# Patient Record
Sex: Male | Born: 1940 | Race: White | Hispanic: No | Marital: Married | Smoking: Never smoker
Health system: Southern US, Community
[De-identification: ages and names within clinical notes are randomized; demographics above are authoritative.]

## PROBLEM LIST (undated history)

## (undated) DIAGNOSIS — N400 Enlarged prostate without lower urinary tract symptoms: Secondary | ICD-10-CM

## (undated) DIAGNOSIS — M1990 Unspecified osteoarthritis, unspecified site: Secondary | ICD-10-CM

## (undated) DIAGNOSIS — I10 Essential (primary) hypertension: Secondary | ICD-10-CM

## (undated) DIAGNOSIS — D649 Anemia, unspecified: Secondary | ICD-10-CM

## (undated) DIAGNOSIS — K219 Gastro-esophageal reflux disease without esophagitis: Secondary | ICD-10-CM

## (undated) DIAGNOSIS — Z8719 Personal history of other diseases of the digestive system: Secondary | ICD-10-CM

## (undated) DIAGNOSIS — R42 Dizziness and giddiness: Secondary | ICD-10-CM

## (undated) DIAGNOSIS — L570 Actinic keratosis: Secondary | ICD-10-CM

## (undated) HISTORY — PX: LASER OF PROSTATE W/ GREEN LIGHT PVP: SHX1953

## (undated) HISTORY — PX: HERNIA REPAIR: SHX51

## (undated) HISTORY — PX: SEPTOPLASTY: SUR1290

## (undated) HISTORY — DX: Actinic keratosis: L57.0

## (undated) HISTORY — PX: HAMMER TOE SURGERY: SHX385

## (undated) HISTORY — PX: SHOULDER ARTHROSCOPY WITH CAPSULORRHAPHY: SHX6454

---

## 2005-01-31 ENCOUNTER — Inpatient Hospital Stay: Payer: Self-pay | Admitting: Internal Medicine

## 2005-02-10 ENCOUNTER — Ambulatory Visit: Payer: Self-pay | Admitting: Specialist

## 2006-07-15 ENCOUNTER — Ambulatory Visit: Payer: Self-pay | Admitting: Unknown Physician Specialty

## 2007-05-11 ENCOUNTER — Ambulatory Visit: Payer: Self-pay | Admitting: Ophthalmology

## 2007-05-12 ENCOUNTER — Ambulatory Visit: Payer: Self-pay | Admitting: Ophthalmology

## 2007-05-17 ENCOUNTER — Ambulatory Visit: Payer: Self-pay | Admitting: Ophthalmology

## 2007-09-21 DIAGNOSIS — C4491 Basal cell carcinoma of skin, unspecified: Secondary | ICD-10-CM

## 2007-09-21 HISTORY — DX: Basal cell carcinoma of skin, unspecified: C44.91

## 2008-01-15 ENCOUNTER — Emergency Department: Payer: Self-pay | Admitting: Urology

## 2008-03-08 ENCOUNTER — Ambulatory Visit: Payer: Self-pay | Admitting: Internal Medicine

## 2011-03-05 ENCOUNTER — Ambulatory Visit: Payer: Self-pay | Admitting: Physician Assistant

## 2011-03-23 ENCOUNTER — Ambulatory Visit: Payer: Self-pay | Admitting: Unknown Physician Specialty

## 2011-05-11 ENCOUNTER — Ambulatory Visit: Payer: Self-pay | Admitting: Unknown Physician Specialty

## 2011-05-13 LAB — PATHOLOGY REPORT

## 2011-09-17 ENCOUNTER — Ambulatory Visit: Payer: Self-pay | Admitting: Otolaryngology

## 2012-09-18 ENCOUNTER — Ambulatory Visit: Payer: Self-pay | Admitting: Urology

## 2013-03-05 ENCOUNTER — Ambulatory Visit: Payer: Self-pay | Admitting: Podiatry

## 2013-03-05 DIAGNOSIS — E789 Disorder of lipoprotein metabolism, unspecified: Secondary | ICD-10-CM

## 2013-03-05 LAB — CBC WITH DIFFERENTIAL/PLATELET
Basophil %: 0.5 %
Eosinophil #: 0.1 10*3/uL (ref 0.0–0.7)
Eosinophil %: 1.6 %
HCT: 41.9 % (ref 40.0–52.0)
HGB: 14.5 g/dL (ref 13.0–18.0)
Lymphocyte %: 28.9 %
MCV: 92 fL (ref 80–100)
Monocyte %: 9.9 %
WBC: 7.2 10*3/uL (ref 3.8–10.6)

## 2013-03-09 ENCOUNTER — Ambulatory Visit: Payer: Self-pay | Admitting: Podiatry

## 2013-05-11 ENCOUNTER — Ambulatory Visit: Payer: Self-pay | Admitting: Otolaryngology

## 2013-05-11 LAB — CREATININE, SERUM
Creatinine: 0.93 mg/dL (ref 0.60–1.30)
EGFR (African American): >60
EGFR (Non-African Amer.): >60

## 2013-11-24 ENCOUNTER — Ambulatory Visit: Payer: Self-pay | Admitting: Specialist

## 2014-11-08 DIAGNOSIS — M1711 Unilateral primary osteoarthritis, right knee: Secondary | ICD-10-CM | POA: Diagnosis not present

## 2014-11-15 DIAGNOSIS — N403 Nodular prostate with lower urinary tract symptoms: Secondary | ICD-10-CM | POA: Diagnosis not present

## 2014-11-15 DIAGNOSIS — Z79899 Other long term (current) drug therapy: Secondary | ICD-10-CM | POA: Diagnosis not present

## 2014-11-15 DIAGNOSIS — E780 Pure hypercholesterolemia: Secondary | ICD-10-CM | POA: Diagnosis not present

## 2014-11-15 DIAGNOSIS — R7309 Other abnormal glucose: Secondary | ICD-10-CM | POA: Diagnosis not present

## 2014-11-15 DIAGNOSIS — I10 Essential (primary) hypertension: Secondary | ICD-10-CM | POA: Diagnosis not present

## 2014-11-15 DIAGNOSIS — D649 Anemia, unspecified: Secondary | ICD-10-CM | POA: Diagnosis not present

## 2014-11-19 ENCOUNTER — Ambulatory Visit: Payer: Self-pay | Admitting: Podiatry

## 2014-11-19 DIAGNOSIS — B351 Tinea unguium: Secondary | ICD-10-CM | POA: Diagnosis not present

## 2014-11-19 DIAGNOSIS — M898X9 Other specified disorders of bone, unspecified site: Secondary | ICD-10-CM | POA: Diagnosis not present

## 2014-11-19 DIAGNOSIS — M79675 Pain in left toe(s): Secondary | ICD-10-CM | POA: Diagnosis not present

## 2014-11-19 DIAGNOSIS — M2042 Other hammer toe(s) (acquired), left foot: Secondary | ICD-10-CM | POA: Diagnosis not present

## 2014-11-19 DIAGNOSIS — M79674 Pain in right toe(s): Secondary | ICD-10-CM | POA: Diagnosis not present

## 2014-11-22 ENCOUNTER — Ambulatory Visit: Payer: Self-pay | Admitting: Podiatry

## 2014-11-22 DIAGNOSIS — M898X9 Other specified disorders of bone, unspecified site: Secondary | ICD-10-CM | POA: Diagnosis not present

## 2014-11-22 DIAGNOSIS — Z791 Long term (current) use of non-steroidal anti-inflammatories (NSAID): Secondary | ICD-10-CM | POA: Diagnosis not present

## 2014-11-22 DIAGNOSIS — N419 Inflammatory disease of prostate, unspecified: Secondary | ICD-10-CM | POA: Diagnosis not present

## 2014-11-22 DIAGNOSIS — Z79899 Other long term (current) drug therapy: Secondary | ICD-10-CM | POA: Diagnosis not present

## 2014-11-22 DIAGNOSIS — E785 Hyperlipidemia, unspecified: Secondary | ICD-10-CM | POA: Diagnosis not present

## 2014-11-22 DIAGNOSIS — Z7982 Long term (current) use of aspirin: Secondary | ICD-10-CM | POA: Diagnosis not present

## 2014-11-22 DIAGNOSIS — K219 Gastro-esophageal reflux disease without esophagitis: Secondary | ICD-10-CM | POA: Diagnosis not present

## 2014-11-22 DIAGNOSIS — M1990 Unspecified osteoarthritis, unspecified site: Secondary | ICD-10-CM | POA: Diagnosis not present

## 2014-11-22 DIAGNOSIS — M2042 Other hammer toe(s) (acquired), left foot: Secondary | ICD-10-CM | POA: Diagnosis not present

## 2014-11-22 DIAGNOSIS — B20 Human immunodeficiency virus [HIV] disease: Secondary | ICD-10-CM | POA: Diagnosis not present

## 2014-11-22 DIAGNOSIS — N400 Enlarged prostate without lower urinary tract symptoms: Secondary | ICD-10-CM | POA: Diagnosis not present

## 2014-11-22 DIAGNOSIS — M899 Disorder of bone, unspecified: Secondary | ICD-10-CM | POA: Diagnosis not present

## 2014-11-27 DIAGNOSIS — M2042 Other hammer toe(s) (acquired), left foot: Secondary | ICD-10-CM | POA: Diagnosis not present

## 2014-12-20 DIAGNOSIS — E785 Hyperlipidemia, unspecified: Secondary | ICD-10-CM | POA: Diagnosis not present

## 2014-12-20 DIAGNOSIS — H539 Unspecified visual disturbance: Secondary | ICD-10-CM | POA: Diagnosis not present

## 2014-12-20 DIAGNOSIS — Z79899 Other long term (current) drug therapy: Secondary | ICD-10-CM | POA: Diagnosis not present

## 2014-12-20 DIAGNOSIS — R5383 Other fatigue: Secondary | ICD-10-CM | POA: Diagnosis not present

## 2014-12-24 DIAGNOSIS — M1711 Unilateral primary osteoarthritis, right knee: Secondary | ICD-10-CM | POA: Diagnosis not present

## 2014-12-25 DIAGNOSIS — J208 Acute bronchitis due to other specified organisms: Secondary | ICD-10-CM | POA: Diagnosis not present

## 2014-12-25 DIAGNOSIS — B9689 Other specified bacterial agents as the cause of diseases classified elsewhere: Secondary | ICD-10-CM | POA: Diagnosis not present

## 2014-12-25 DIAGNOSIS — J019 Acute sinusitis, unspecified: Secondary | ICD-10-CM | POA: Diagnosis not present

## 2014-12-30 DIAGNOSIS — M79675 Pain in left toe(s): Secondary | ICD-10-CM | POA: Diagnosis not present

## 2014-12-30 DIAGNOSIS — M79674 Pain in right toe(s): Secondary | ICD-10-CM | POA: Diagnosis not present

## 2014-12-30 DIAGNOSIS — B351 Tinea unguium: Secondary | ICD-10-CM | POA: Diagnosis not present

## 2015-01-07 ENCOUNTER — Ambulatory Visit
Admit: 2015-01-07 | Disposition: A | Discharge status: Home / Self Care | Payer: Self-pay | Attending: Urology | Admitting: Urology

## 2015-01-07 DIAGNOSIS — R351 Nocturia: Secondary | ICD-10-CM | POA: Diagnosis not present

## 2015-01-07 DIAGNOSIS — R972 Elevated prostate specific antigen [PSA]: Secondary | ICD-10-CM | POA: Diagnosis not present

## 2015-01-07 DIAGNOSIS — N401 Enlarged prostate with lower urinary tract symptoms: Secondary | ICD-10-CM | POA: Diagnosis not present

## 2015-01-08 DIAGNOSIS — N401 Enlarged prostate with lower urinary tract symptoms: Secondary | ICD-10-CM | POA: Diagnosis not present

## 2015-01-08 DIAGNOSIS — R350 Frequency of micturition: Secondary | ICD-10-CM | POA: Diagnosis not present

## 2015-01-08 DIAGNOSIS — R351 Nocturia: Secondary | ICD-10-CM | POA: Diagnosis not present

## 2015-01-10 DIAGNOSIS — D649 Anemia, unspecified: Secondary | ICD-10-CM | POA: Diagnosis not present

## 2015-01-10 DIAGNOSIS — I10 Essential (primary) hypertension: Secondary | ICD-10-CM | POA: Diagnosis not present

## 2015-01-10 DIAGNOSIS — E780 Pure hypercholesterolemia: Secondary | ICD-10-CM | POA: Diagnosis not present

## 2015-01-10 DIAGNOSIS — Z0000 Encounter for general adult medical examination without abnormal findings: Secondary | ICD-10-CM | POA: Diagnosis not present

## 2015-01-14 ENCOUNTER — Ambulatory Visit
Admit: 2015-01-14 | Disposition: A | Discharge status: Home / Self Care | Payer: Self-pay | Attending: Urology | Admitting: Urology

## 2015-01-14 DIAGNOSIS — Z7982 Long term (current) use of aspirin: Secondary | ICD-10-CM | POA: Diagnosis not present

## 2015-01-14 DIAGNOSIS — R42 Dizziness and giddiness: Secondary | ICD-10-CM | POA: Diagnosis not present

## 2015-01-14 DIAGNOSIS — N401 Enlarged prostate with lower urinary tract symptoms: Secondary | ICD-10-CM | POA: Diagnosis not present

## 2015-01-14 DIAGNOSIS — E785 Hyperlipidemia, unspecified: Secondary | ICD-10-CM | POA: Diagnosis not present

## 2015-01-14 DIAGNOSIS — Z79899 Other long term (current) drug therapy: Secondary | ICD-10-CM | POA: Diagnosis not present

## 2015-01-14 DIAGNOSIS — R252 Cramp and spasm: Secondary | ICD-10-CM | POA: Diagnosis not present

## 2015-01-14 DIAGNOSIS — R413 Other amnesia: Secondary | ICD-10-CM | POA: Diagnosis not present

## 2015-01-14 DIAGNOSIS — N400 Enlarged prostate without lower urinary tract symptoms: Secondary | ICD-10-CM | POA: Diagnosis not present

## 2015-01-14 DIAGNOSIS — M1990 Unspecified osteoarthritis, unspecified site: Secondary | ICD-10-CM | POA: Diagnosis not present

## 2015-01-22 DIAGNOSIS — Z1211 Encounter for screening for malignant neoplasm of colon: Secondary | ICD-10-CM | POA: Diagnosis not present

## 2015-01-24 NOTE — Op Note (Signed)
PATIENT NAME:  Herbert Molina, Herbert Molina MR#:  [id number] DATE OF BIRTH:  January 16, 1941  DATE OF PROCEDURE:  03/09/2013  SURGEON: Sharlotte Alamo, DPM   PREOPERATIVE DIAGNOSIS: Hammertoe with exostosis, right fourth and fifth toes.   POSTOPERATIVE DIAGNOSIS: Hammertoe with exostosis, right fourth and fifth toes.   PROCEDURES:  1. Arthroplasty, right fourth and fifth toes.  2. Exostectomy, right fifth toe.   ANESTHESIA: Local MAC.   HEMOSTASIS: Pneumatic tourniquet, right ankle, 250 mmHg.   ESTIMATED BLOOD LOSS: Minimal.   MATERIALS: None.   PATHOLOGY: None.   COMPLICATIONS: None apparent.   OPERATIVE INDICATIONS: This is a 74 year old male with chronic painful hammertoes with history of ulceration. The patient elects for surgical removal of his deformities.   OPERATIVE PROCEDURE: The patient was taken to the operating room and placed on the table in the supine position. Following satisfactory sedation, the right foot was anesthetized with 10 mL of 0.5% Sensorcaine plain around the fourth and fifth toes. Attention was then directed to the dorsal aspect of both the fourth and fifth toes, where a linear incision was made approximately 1.5 cm in length over the proximal interphalangeal joints. The incision was deepened via sharp and blunt dissection down to the level of the joint, where a transverse tenotomy was performed and the capsular and periosteal tissues reflected off the head of the proximal phalanx. The head of the proximal phalanx of each toe on the fourth and fifth was then resected in toto. There was noted to be a palpable bony prominence along the medial aspect of the right fifth toe. This was too distal to achieve a good resection from the operative wound, so another incision approximately 1 cm in length was made along the medial aspect of the toe directly over the lesion. Dissection carried down to the level of the bone, where the bone was removed with bone cutting forceps and rasped smooth.  Intraoperative FluoroScan views revealed good reduction of the deformities in the toes. The wounds were then all flushed with copious amounts of sterile saline. The fifth toe medial incision was closed using 5-0 nylon simple interrupted sutures. Tendon reapproximation was performed on the fourth and fifth toes using a 4-0 Vicryl simple interrupted suture. The dorsal incisions were then closed using 5-0 nylon simple interrupted sutures. Xeroform and sterile bandages applied. Tourniquet was released and blood flow noted to return immediately to the right foot in digits 1 through 5. The patient tolerated the procedure and anesthesia well and was transported to the PACU with vital signs stable and in good condition.   ____________________________ Sharlotte Alamo, DPM tc:OSi D: 03/09/2013 11:49:00 ET T: 03/09/2013 12:09:15 ET JOB#: [id number]  cc: Sharlotte Alamo, DPM, <Dictator> Loman Logan DPM ELECTRONICALLY SIGNED 03/15/2013 10:40

## 2015-01-29 DIAGNOSIS — L578 Other skin changes due to chronic exposure to nonionizing radiation: Secondary | ICD-10-CM | POA: Diagnosis not present

## 2015-01-29 DIAGNOSIS — D485 Neoplasm of uncertain behavior of skin: Secondary | ICD-10-CM | POA: Diagnosis not present

## 2015-01-29 DIAGNOSIS — L720 Epidermal cyst: Secondary | ICD-10-CM | POA: Diagnosis not present

## 2015-01-29 DIAGNOSIS — D2339 Other benign neoplasm of skin of other parts of face: Secondary | ICD-10-CM | POA: Diagnosis not present

## 2015-01-29 DIAGNOSIS — L821 Other seborrheic keratosis: Secondary | ICD-10-CM | POA: Diagnosis not present

## 2015-01-29 DIAGNOSIS — L820 Inflamed seborrheic keratosis: Secondary | ICD-10-CM | POA: Diagnosis not present

## 2015-01-29 DIAGNOSIS — Z85828 Personal history of other malignant neoplasm of skin: Secondary | ICD-10-CM | POA: Diagnosis not present

## 2015-02-02 NOTE — Op Note (Signed)
PATIENT NAME:  Herbert Molina, Herbert Molina MR#:  [id number] DATE OF BIRTH:  [birth year]  DATE OF OPERATION:  April 12.    PREOPERATIVE DIAGNOSIS:  Benign prostatic hypertrophy with bladder outlet obstruction.   POSTOPERATIVE DIAGNOSIS:  Benign prostatic hypertrophy with bladder outlet obstruction.   PROCEDURE:  Photovaporization of prostate with GreenLight laser.  SURGEON:   Yves Dill.      ANESTHETIST:  [employer].   ANESTHETIC METHOD:  General.   INDICATIONS:  See the dictated history and physical.  After informed consent, the patient requests the above procedure.   OPERATIVE SUMMARY:  After adequate general anesthesia had been obtained, the patient was placed into dorsolithotomy position and the perineum was prepped and draped in the usual fashion.  The 21-French laser scope was coupled with a camera and then visually advanced into the bladder.  The bladder was moderately trabeculated.  No bladder tumors were identified.  The patient had trilobar prostatic hypertrophy with obstructing median lobe.  At this point, the GreenLight XPS laser fiber was introduced through the scope and set at 80 watts.  Bladder neck tissue and median lobe were vaporized.  Power was then increased to 120 watts and obstructive tissue from the bladder neck to the verumontanum was vaporized.  Finally, power was increased and remaining obstructive tissue was vaporized at 180 watts.  At this point, the scope was removed.  10 mL of viscous Xylocaine was instilled within the urethra and the bladder.  A 03-JKKXFG silicone catheter was placed and irrigated until clear.  B and O suppository was placed.  Procedure was then terminated, and the patient was transferred to the recovery room in stable condition.   ____________________________ Otelia Limes. Yves Dill, MD mrw:kc D: 01/14/2015 13:47:28 ET T: 01/14/2015 14:06:44 ET JOB#: [id number]  cc: Otelia Limes. Yves Dill, MD, <Dictator> Royston Cowper MD ELECTRONICALLY SIGNED 01/14/2015 15:16

## 2015-02-02 NOTE — Op Note (Signed)
PATIENT NAME:  Herbert Molina, Herbert Molina MR#:  [id number] DATE OF BIRTH:  08/15/1941  DATE OF PROCEDURE:  11/22/2014  SURGEON:  Durward Fortes, DPM.   PREOPERATIVE DIAGNOSIS: Hammertoes with exostosis, left fourth and fifth toes.   PROCEDURES:  1.  Arthroplasties left fourth and fifth toes.  2.  Exostectomy distal phalanx, left fifth toe.   ANESTHESIA:  Local MAC.   HEMOSTASIS: Pneumatic tourniquet left ankle, 250 mmHg.   ESTIMATED BLOOD LOSS: Minimal.   MATERIALS: None.   COMPLICATIONS: None apparent.   PATHOLOGY:  None.   OPERATIVE INDICATIONS: This is a 74 year old male with chronic painful hammertoes with some ulcerative lesions on the fifth toe who elects for surgical intervention, similar to what was previously performed on his right foot.   OPERATIVE PROCEDURE:  The patient was taken to the Operating Room and placed on the table in the supine position. Following satisfactory sedation, the left foot was anesthetized with 6 mL of 0.5% Sensorcaine plain around the left fourth and fifth toes. A pneumatic tourniquet was applied at the level of the left ankle and the foot was prepped and draped in the usual sterile fashion. The foot was exsanguinated and the tourniquet inflated to 250 mmHg.   Attention was then directed to the dorsal aspect of the left foot where a 1.5 cm linear incision was made over both of the fourth and fifth toes over the proximal interphalangeal joints. The incision was deepened down to the level of the joint where a transverse tenotomy was performed and the periosteal and capsular tissues reflected off of the head of the proximal phalanx of both of the toes. Using a pneumatic saw, the head of the proximal phalanx of both of the toes was then resected and removed in toto. The wounds were flushed with copious amounts of sterile saline. The extensor tendon was then reapproximated using a 4-0 Vicryl simple interrupted suture followed by skin closure using 5-0 nylon simple  interrupted sutures. At this point, attention was then directed to the distal medial aspect of the left fifth toe where a calloused area was present. An approximate 1 cm linear incision was made coursing proximal to distal through the medial aspect and carried sharply down to the level of the bone. The soft tissues were freed from the bone and the medial prominence of the distal phalanx was resected using a Rongeur and then rasped smooth. The wound was flushed with copious amounts of sterile saline. Intraoperative Fluoroscan views revealed good reduction of all of the deformities. The wound was then closed with 5-0 nylon simple interrupted sutures. Xeroform and a sterile gauze bandage were applied. The tourniquet was deflated and blood flow noted to return immediately to all digits. An Ace wrap was applied.   The patient tolerated the procedure and anesthesia well and was transported to the PACU with vital signs stable and in good condition.    ____________________________ Sharlotte Alamo, DPM tc:at D: 11/22/2014 16:50:22 ET T: 11/22/2014 20:11:25 ET JOB#: [id number]  cc: Sharlotte Alamo, DPM, <Dictator> Ludia Gartland DPM ELECTRONICALLY SIGNED 12/04/2014 9:10

## 2015-02-02 NOTE — H&P (Signed)
PATIENT NAME:  Herbert Molina, Herbert Molina MR#:  [id number] DATE OF BIRTH:  07-06-1941  DATE OF ADMISSION:  01/07/2015   CHIEF COMPLAINT: Difficulty voiding.   HISTORY OF PRESENT ILLNESS: Herbert Molina is a 74 year old white male with a long history of BPH and lower urinary tract symptoms. Current management includes included tamsulosin 0.4 mg a day and finasteride 5 mg daily. Symptoms have not improved and he comes in now for photovaporization of the prostate with a GreenLight laser. Evaluation in the office included Uroflow study on August 24 indicating maximum flow rate of 10 mL/sec with average flow rate of 5 mL/sec.  Prostate ultrasound August 25 indicated a 36.9 gram prostate with median lobe enlargement. Cystoscopy on 06/04/2014 indicated trilobar BPH with intravesical growth of median lobe. Most recent PSA was 4.9 ng/ml on 05/24/2014.     ALLERGIES: No drug allergies.   CURRENT MEDICATIONS INCLUDED: Aspirin, coenzyme Q, Colace, donepezil, finasteride, garlic, ibuprofen, Lipitor, meclizine, multivitamins, Norco, omega-3 fish oil, omeprazole, Osteo Bi-Flex, potassium chloride, sucralfate, Tylenol, acyclovir and vitamin D3.   PREVIOUS SURGICAL PROCEDURES INCLUDE:  TUNA 1999 and TUMT 2009.    PAST AND CURRENT MEDICAL CONDITIONS:   1.  GERD.  2.  Chronic constipation. 3.  Degenerative joint disease.  4.  Chronic bronchitis.   REVIEW OF SYSTEMS: The patient denied chest pain, heart disease, stroke, diabetes, or hypertension.   SOCIAL HISTORY: The patient denied tobacco or alcohol use.   FAMILY HISTORY: Father died of stroke at age 71 and had prostate cancer, mother died at age 42 of melanoma.   PHYSICAL EXAMINATION:  GENERAL: A well-nourished white male in no acute distress. VITAL SIGNS:  Blood pressure 130/70, temperature 97.8, pulse 74, weight 198.  HEENT EXAMINATION: Sclerae were clear. Pupils were equally round, reactive to light and accommodation. Extraocular movements were intact.  NECK: Supple.  No palpable cervical adenopathy, no audible carotid bruits.  LUNGS: Clear to auscultation.  CARDIOVASCULAR EXAMINATION: Regular rhythm and rate without audible murmurs or gallops.  ABDOMINAL EXAMINATION: Soft, nontender abdomen.  GENITOURINARY EXAMINATION: Prostate gland 50 grams.  Smooth, nontender prostate.  NEUROMUSCULAR EXAMINATION: Alert and oriented x 3.   IMPRESSION:  1.  Benign prostatic hypertrophy with bladder outlet obstruction.  2.  Elevated PSA.   PLAN: Photovaporization of the prostate with the GreenLight laser.   ____________________________ Otelia Limes. Yves Dill, MD mrw:sp D: 01/07/2015 09:58:31 ET T: 01/07/2015 10:08:01 ET JOB#: [id number]  cc: Otelia Limes. Yves Dill, MD, <Dictator> Royston Cowper MD ELECTRONICALLY SIGNED 01/07/2015 13:02

## 2015-02-05 DIAGNOSIS — S83231D Complex tear of medial meniscus, current injury, right knee, subsequent encounter: Secondary | ICD-10-CM | POA: Diagnosis not present

## 2015-02-06 DIAGNOSIS — B351 Tinea unguium: Secondary | ICD-10-CM | POA: Diagnosis not present

## 2015-02-06 DIAGNOSIS — M79675 Pain in left toe(s): Secondary | ICD-10-CM | POA: Diagnosis not present

## 2015-02-06 DIAGNOSIS — M79674 Pain in right toe(s): Secondary | ICD-10-CM | POA: Diagnosis not present

## 2015-02-07 DIAGNOSIS — R300 Dysuria: Secondary | ICD-10-CM | POA: Diagnosis not present

## 2015-02-07 DIAGNOSIS — R351 Nocturia: Secondary | ICD-10-CM | POA: Diagnosis not present

## 2015-02-07 DIAGNOSIS — M328 Other forms of systemic lupus erythematosus: Secondary | ICD-10-CM | POA: Diagnosis not present

## 2015-02-07 DIAGNOSIS — N3281 Overactive bladder: Secondary | ICD-10-CM | POA: Diagnosis not present

## 2015-02-12 DIAGNOSIS — J029 Acute pharyngitis, unspecified: Secondary | ICD-10-CM | POA: Diagnosis not present

## 2015-02-13 ENCOUNTER — Encounter
Admission: RE | Admit: 2015-02-13 | Discharge: 2015-02-13 | Disposition: A | Discharge status: Home / Self Care | Payer: Commercial Managed Care - HMO | Source: Ambulatory Visit | Attending: Specialist | Admitting: Specialist

## 2015-02-13 DIAGNOSIS — Z79899 Other long term (current) drug therapy: Secondary | ICD-10-CM | POA: Diagnosis not present

## 2015-02-13 DIAGNOSIS — M79671 Pain in right foot: Secondary | ICD-10-CM | POA: Insufficient documentation

## 2015-02-13 DIAGNOSIS — Z01812 Encounter for preprocedural laboratory examination: Secondary | ICD-10-CM | POA: Insufficient documentation

## 2015-02-13 DIAGNOSIS — M79672 Pain in left foot: Secondary | ICD-10-CM | POA: Diagnosis not present

## 2015-02-13 DIAGNOSIS — Z01810 Encounter for preprocedural cardiovascular examination: Secondary | ICD-10-CM | POA: Diagnosis not present

## 2015-02-13 DIAGNOSIS — I10 Essential (primary) hypertension: Secondary | ICD-10-CM | POA: Diagnosis not present

## 2015-02-13 HISTORY — DX: Gastro-esophageal reflux disease without esophagitis: K21.9

## 2015-02-13 HISTORY — DX: Dizziness and giddiness: R42

## 2015-02-13 HISTORY — DX: Personal history of other diseases of the digestive system: Z87.19

## 2015-02-13 HISTORY — DX: Essential (primary) hypertension: I10

## 2015-02-13 HISTORY — DX: Anemia, unspecified: D64.9

## 2015-02-13 HISTORY — DX: Benign prostatic hyperplasia without lower urinary tract symptoms: N40.0

## 2015-02-13 HISTORY — DX: Unspecified osteoarthritis, unspecified site: M19.90

## 2015-02-13 LAB — HEMOGLOBIN: HEMOGLOBIN: 14.3 g/dL (ref 13.0–18.0)

## 2015-02-18 ENCOUNTER — Ambulatory Visit: Payer: Commercial Managed Care - HMO | Admitting: Registered Nurse

## 2015-02-18 ENCOUNTER — Encounter: Payer: Self-pay | Admitting: *Deleted

## 2015-02-18 ENCOUNTER — Ambulatory Visit
Admission: RE | Admit: 2015-02-18 | Discharge: 2015-02-18 | Disposition: A | Discharge status: Home / Self Care | Payer: Commercial Managed Care - HMO | Source: Ambulatory Visit | Attending: Specialist | Admitting: Specialist

## 2015-02-18 ENCOUNTER — Encounter
Admission: RE | Disposition: A | Discharge status: Home / Self Care | Payer: Self-pay | Source: Ambulatory Visit | Attending: Specialist

## 2015-02-18 DIAGNOSIS — R413 Other amnesia: Secondary | ICD-10-CM | POA: Insufficient documentation

## 2015-02-18 DIAGNOSIS — M94261 Chondromalacia, right knee: Secondary | ICD-10-CM | POA: Diagnosis not present

## 2015-02-18 DIAGNOSIS — E785 Hyperlipidemia, unspecified: Secondary | ICD-10-CM | POA: Insufficient documentation

## 2015-02-18 DIAGNOSIS — M23231 Derangement of other medial meniscus due to old tear or injury, right knee: Secondary | ICD-10-CM | POA: Diagnosis not present

## 2015-02-18 DIAGNOSIS — M23221 Derangement of posterior horn of medial meniscus due to old tear or injury, right knee: Secondary | ICD-10-CM | POA: Diagnosis not present

## 2015-02-18 DIAGNOSIS — D649 Anemia, unspecified: Secondary | ICD-10-CM | POA: Insufficient documentation

## 2015-02-18 DIAGNOSIS — S83241A Other tear of medial meniscus, current injury, right knee, initial encounter: Secondary | ICD-10-CM | POA: Diagnosis not present

## 2015-02-18 DIAGNOSIS — I10 Essential (primary) hypertension: Secondary | ICD-10-CM | POA: Diagnosis not present

## 2015-02-18 DIAGNOSIS — S83231A Complex tear of medial meniscus, current injury, right knee, initial encounter: Secondary | ICD-10-CM | POA: Diagnosis not present

## 2015-02-18 DIAGNOSIS — N400 Enlarged prostate without lower urinary tract symptoms: Secondary | ICD-10-CM | POA: Diagnosis not present

## 2015-02-18 DIAGNOSIS — Z79899 Other long term (current) drug therapy: Secondary | ICD-10-CM | POA: Insufficient documentation

## 2015-02-18 DIAGNOSIS — M1990 Unspecified osteoarthritis, unspecified site: Secondary | ICD-10-CM | POA: Diagnosis not present

## 2015-02-18 HISTORY — PX: KNEE ARTHROSCOPY WITH MEDIAL MENISECTOMY: SHX5651

## 2015-02-18 SURGERY — ARTHROSCOPY, KNEE, WITH MEDIAL MENISCECTOMY
Anesthesia: General | Laterality: Right

## 2015-02-18 MED ORDER — GLYCOPYRROLATE 0.2 MG/ML IJ SOLN
INTRAMUSCULAR | Status: DC | PRN
  Administered 2015-02-18: 0.2 mg via INTRAVENOUS

## 2015-02-18 MED ORDER — PROPOFOL 10 MG/ML IV BOLUS
INTRAVENOUS | Status: DC | PRN
  Administered 2015-02-18: 180 mg via INTRAVENOUS

## 2015-02-18 MED ORDER — ACETAMINOPHEN 10 MG/ML IV SOLN
INTRAVENOUS | Status: AC
  Filled 2015-02-18: qty 100

## 2015-02-18 MED ORDER — LIDOCAINE HCL 2 % EX GEL
CUTANEOUS | Status: DC | PRN
  Administered 2015-02-18: 1 via TOPICAL

## 2015-02-18 MED ORDER — FENTANYL CITRATE (PF) 100 MCG/2ML IJ SOLN
25.0000 ug | INTRAMUSCULAR | Status: DC | PRN
  Administered 2015-02-18 (×4): 25 ug via INTRAVENOUS

## 2015-02-18 MED ORDER — ONDANSETRON HCL 4 MG/2ML IJ SOLN: 4.0000 mg | Freq: Once | INTRAMUSCULAR | Status: DC | PRN

## 2015-02-18 MED ORDER — KETOROLAC TROMETHAMINE 30 MG/ML IJ SOLN
INTRAMUSCULAR | Status: DC | PRN
  Administered 2015-02-18: 30 mg via INTRAVENOUS

## 2015-02-18 MED ORDER — MORPHINE SULFATE 4 MG/ML IJ SOLN
INTRAMUSCULAR | Status: AC
Start: 2015-02-18 — End: 2015-02-18
  Filled 2015-02-18: qty 1

## 2015-02-18 MED ORDER — MIDAZOLAM HCL 2 MG/2ML IJ SOLN
INTRAMUSCULAR | Status: DC | PRN
  Administered 2015-02-18: 2 mg via INTRAVENOUS

## 2015-02-18 MED ORDER — MORPHINE SULFATE (PF) 4 MG/ML IV SOLN
INTRAVENOUS | Status: DC | PRN
  Administered 2015-02-18: 4 mg

## 2015-02-18 MED ORDER — LACTATED RINGERS IV SOLN
INTRAVENOUS | Status: DC
  Administered 2015-02-18 (×2): via INTRAVENOUS

## 2015-02-18 MED ORDER — LIDOCAINE HCL (CARDIAC) 20 MG/ML IV SOLN
INTRAVENOUS | Status: DC | PRN
  Administered 2015-02-18: 100 mg via INTRAVENOUS

## 2015-02-18 MED ORDER — LACTATED RINGERS IR SOLN
Status: DC | PRN
  Administered 2015-02-18: 4500 mL

## 2015-02-18 MED ORDER — FENTANYL CITRATE (PF) 100 MCG/2ML IJ SOLN
INTRAMUSCULAR | Status: DC | PRN
  Administered 2015-02-18 (×2): 50 ug via INTRAVENOUS

## 2015-02-18 MED ORDER — ACETAMINOPHEN 10 MG/ML IV SOLN
INTRAVENOUS | Status: DC | PRN
  Administered 2015-02-18: 1000 mg via INTRAVENOUS

## 2015-02-18 MED ORDER — ONDANSETRON HCL 4 MG/2ML IJ SOLN
INTRAMUSCULAR | Status: DC | PRN
  Administered 2015-02-18: 4 mg via INTRAVENOUS

## 2015-02-18 MED ORDER — BUPIVACAINE-EPINEPHRINE (PF) 0.5% -1:200000 IJ SOLN
INTRAMUSCULAR | Status: AC
  Filled 2015-02-18: qty 30

## 2015-02-18 MED ORDER — BUPIVACAINE-EPINEPHRINE (PF) 0.5% -1:200000 IJ SOLN
INTRAMUSCULAR | Status: DC | PRN
  Administered 2015-02-18: 10 mL
  Administered 2015-02-18: 20 mL

## 2015-02-18 MED ORDER — FENTANYL CITRATE (PF) 100 MCG/2ML IJ SOLN
INTRAMUSCULAR | Status: AC
  Filled 2015-02-18: qty 2

## 2015-02-18 MED FILL — Morphine Sulfate Inj 4 MG/ML: INTRAMUSCULAR | Qty: 1 | Status: AC

## 2015-02-18 SURGICAL SUPPLY — 25 items
BANDAGE ELASTIC 6 CLIP NS LF (GAUZE/BANDAGES/DRESSINGS) ×3 IMPLANT
BLADE AGGRESSIVE PLUS 4.0 (BLADE) ×3 IMPLANT
BUR RADIUS 3.5 (BURR) ×3 IMPLANT
CHLORAPREP W/TINT 26ML (MISCELLANEOUS) ×3 IMPLANT
DECANTER SPIKE VIAL GLASS SM (MISCELLANEOUS) ×3 IMPLANT
GAUZE SPONGE 4X4 12PLY STRL (GAUZE/BANDAGES/DRESSINGS) ×3 IMPLANT
GAUZE XEROFORM 4X4 STRL (GAUZE/BANDAGES/DRESSINGS) ×3 IMPLANT
GLOVE BIO SURGEON STRL SZ7.5 (GLOVE) ×6 IMPLANT
GLOVE BIOGEL PI IND STRL 7.5 (GLOVE) ×1 IMPLANT
GLOVE BIOGEL PI INDICATOR 7.5 (GLOVE) ×2
GOWN STRL REUS W/ TWL LRG LVL3 (GOWN DISPOSABLE) ×2 IMPLANT
GOWN STRL REUS W/TWL LRG LVL3 (GOWN DISPOSABLE) ×4
IV LACTATED RINGER IRRG 3000ML (IV SOLUTION) ×6
IV LR IRRIG 3000ML ARTHROMATIC (IV SOLUTION) ×3 IMPLANT
MANIFOLD NEPTUNE II (INSTRUMENTS) ×3 IMPLANT
PACK ARTHROSCOPY KNEE (MISCELLANEOUS) ×3 IMPLANT
PAD CAST CTTN 4X4 STRL (SOFTGOODS) ×1 IMPLANT
PADDING CAST COTTON 4X4 STRL (SOFTGOODS) ×2
SET TUBE SUCT SHAVER OUTFL 24K (TUBING) ×3 IMPLANT
SET TUBE TIP INTRA-ARTICULAR (MISCELLANEOUS) IMPLANT
STOCKINETTE BIAS CUT 6 980064 (GAUZE/BANDAGES/DRESSINGS) ×3 IMPLANT
STRAP SAFETY BODY (MISCELLANEOUS) ×3 IMPLANT
SUT ETHILON 5-0 FS-2 18 BLK (SUTURE) ×3 IMPLANT
TUBING ARTHRO INFLOW-ONLY STRL (TUBING) ×3 IMPLANT
WAND HAND CNTRL MULTIVAC 50 (MISCELLANEOUS) ×3 IMPLANT

## 2015-02-18 NOTE — Discharge Instructions (Signed)
Partial weight bearing with walker May remove entire dressing in 24 hours, bathe, get wet, etc. Cover wounds with Band Aids Work vigorously on range of motion of knee Use ice on knee starting tomorrow.Return to office in 6 days for suture removal and exam  AMBULATORY SURGERY  DISCHARGE INSTRUCTIONS   1) The drugs that you were given will stay in your system until tomorrow so for the next 24 hours you should not:  A) Drive an automobile B) Make any legal decisions C) Drink any alcoholic beverage   2) You may resume regular meals tomorrow.  Today it is better to start with liquids and gradually work up to solid foods.  You may eat anything you prefer, but it is better to start with liquids, then soup and crackers, and gradually work up to solid foods.   3) Please notify your doctor immediately if you have any unusual bleeding, trouble breathing, redness and pain at the surgery site, drainage, fever, or pain not relieved by medication. 4)   5) Your post-operative visit with Dr.    George Ina                                 is: Date:                        Time:    Please call to schedule your post-operative visit.  6) Additional Instructions:     Day Surgery- [place] [facility]

## 2015-02-18 NOTE — Brief Op Note (Signed)
02/18/2015  10:50 AM  PATIENT:  Herbert Molina  74 y.o. male  PRE-OPERATIVE DIAGNOSIS:  CURRENT TEAR OF MEDIAL CARTLIAGE AND/OR MENISCUS OF  right KNEE  POST-OPERATIVE DIAGNOSIS:  medial meniscus tear right knee  PROCEDURE:  Procedure(s): KNEE ARTHROSCOPY WITH MEDIAL MENISECTOMY (Right)  SURGEON:  Surgeon(s) and Role:    * Christophe Louis, MD - Primary  PHYSICIAN ASSISTANT:   ASSISTANTS: none   ANESTHESIA:   general  EBL:  Total I/O In: 800 [I.V.:800] Out: -   BLOOD ADMINISTERED:none  DRAINS: none   LOCAL MEDICATIONS USED:  MARCAINE     SPECIMEN:  No Specimen  DISPOSITION OF SPECIMEN:  N/A  COUNTS:  YES  TOURNIQUET:  * No tourniquets in log *  DICTATION: .Other Dictation: Dictation Number [id number]  PLAN OF CARE: Discharge to home after PACU  PATIENT DISPOSITION:  PACU - hemodynamically stable.   Delay start of Pharmacological VTE agent (>24hrs) due to surgical blood loss or risk of bleeding: not applicable

## 2015-02-18 NOTE — H&P (Signed)
  45 tear old male with right knee pain and presumed medial meniscus tear right knee.  History and physical is as office document included within patient's chart.  Heart and lungs are clear.  ENT is clear.  Procedure of arthroscipic debridement and partial menisectomy fully explained to patient including expected result and post op rehab. Patient fully understands and wishes to proceed.

## 2015-02-18 NOTE — Anesthesia Preprocedure Evaluation (Addendum)
Anesthesia Evaluation  Patient identified by MRN, date of birth, ID band Patient awake    Reviewed: Allergy & Precautions, NPO status   History of Anesthesia Complications Negative for: history of anesthetic complications  Airway Mallampati: II       Dental no notable dental hx.    Pulmonary neg pulmonary ROS,    Pulmonary exam normal       Cardiovascular hypertension, Normal cardiovascular examRhythm:Regular Rate:Normal     Neuro/Psych negative neurological ROS  negative psych ROS   GI/Hepatic Neg liver ROS, hiatal hernia, GERD-  ,  Endo/Other    Renal/GU   negative genitourinary   Musculoskeletal  (+) Arthritis -, Osteoarthritis,    Abdominal Normal abdominal exam  (+)   Peds negative pediatric ROS (+)  Hematology  (+) anemia ,   Anesthesia Other Findings   Reproductive/Obstetrics negative OB ROS                            Anesthesia Physical Anesthesia Plan  ASA: II  Anesthesia Plan: General   Post-op Pain Management:    Induction: Intravenous  Airway Management Planned: LMA  Additional Equipment:   Intra-op Plan:   Post-operative Plan: Extubation in OR  Informed Consent: I have reviewed the patients History and Physical, chart, labs and discussed the procedure including the risks, benefits and alternatives for the proposed anesthesia with the patient or authorized representative who has indicated his/her understanding and acceptance.     Plan Discussed with: CRNA and Surgeon  Anesthesia Plan Comments:         Anesthesia Quick Evaluation

## 2015-02-18 NOTE — Transfer of Care (Signed)
Immediate Anesthesia Transfer of Care Note  Patient: Herbert Molina  Procedure(s) Performed: Procedure(s): KNEE ARTHROSCOPY WITH MEDIAL MENISECTOMY (Right)  Patient Location: PACU  Anesthesia Type:General  Level of Consciousness: sedated  Airway & Oxygen Therapy: Patient Spontanous Breathing and Patient connected to face mask oxygen  Post-op Assessment: Report given to RN and Post -op Vital signs reviewed and stable  Post vital signs: Reviewed and stable  Last Vitals:  Filed Vitals:   02/18/15 1044  BP: 136/83  Pulse: 78  Temp: 36.2 C  Resp: 14    Complications: No apparent anesthesia complications

## 2015-02-18 NOTE — Anesthesia Postprocedure Evaluation (Signed)
  Anesthesia Post-op Note  Patient: Herbert Molina  Procedure(s) Performed: Procedure(s): KNEE ARTHROSCOPY WITH MEDIAL MENISECTOMY (Right)  Anesthesia type:General  Patient location: PACU  Post pain: Pain level controlled  Post assessment: Post-op Vital signs reviewed, Patient's Cardiovascular Status Stable, Respiratory Function Stable, Patent Airway and No signs of Nausea or vomiting  Post vital signs: Reviewed and stable  Last Vitals:  Filed Vitals:   02/18/15 1045  BP:   Pulse: 73  Temp:   Resp: 11    Level of consciousness: awake, alert  and patient cooperative  Complications: No apparent anesthesia complications

## 2015-02-18 NOTE — Anesthesia Procedure Notes (Signed)
Procedure Name: LMA Insertion Date/Time: 02/18/2015 9:40 AM Performed by: Doreen Salvage Pre-anesthesia Checklist: Patient identified, Patient being monitored, Emergency Drugs available and Suction available Patient Re-evaluated:Patient Re-evaluated prior to inductionOxygen Delivery Method: Circle system utilized Preoxygenation: Pre-oxygenation with 100% oxygen Intubation Type: IV induction Ventilation: Mask ventilation without difficulty LMA: LMA inserted LMA Size: 4.5 Tube type: Oral Number of attempts: 1 Placement Confirmation: positive ETCO2 and breath sounds checked- equal and bilateral Tube secured with: Tape Dental Injury: Teeth and Oropharynx as per pre-operative assessment

## 2015-02-19 ENCOUNTER — Encounter: Payer: Self-pay | Admitting: Specialist

## 2015-02-19 NOTE — Op Note (Signed)
Herbert Molina, Herbert Molina NO.:  [id number]  MEDICAL RECORD NO.:  [id number]  LOCATION:  [facility]                         FACILITY:  [facility]  PHYSICIAN:  Margaretmary Eddy, MD        DATE OF BIRTH:  [birth year]  DATE OF PROCEDURE:  02/18/2015 DATE OF DISCHARGE:  02/18/2015                              OPERATIVE REPORT   PREOPERATIVE DIAGNOSIS: 1. Probable medial meniscus tear, right knee. 2. Mild tricompartmental degenerative arthritis.  POSTOPERATIVE DIAGNOSIS: 1. Complex tear, mid and posterior horns, medial meniscus, right knee. 2. Moderate degenerative change, medial compartment. 3. Severe chondromalacia, posterior aspect of the patella.  PROCEDURE PERFORMED:  Arthroscopic partial medial meniscectomy, right knee.  SURGEON:  Margaretmary Eddy, MD  ANESTHESIA:  General.  COMPLICATIONS:  None.  DESCRIPTION OF PROCEDURE:  After adequate of general anesthesia, the right lower extremity was placed in a leg holder in the usual manner for arthroscopy.  The right knee and leg are thoroughly prepped with alcohol and ChloraPrep and draped in standard, sterile fashion.  The joint is infiltrated with 10 cc of Marcaine with epinephrine.  Standard diagnostic arthroscopy is performed.  There is moderate-to-severe chondromalacia of the posterior aspect of the patella and moderate synovitis in the suprapatellar pouch.  In the medial compartment, there is a large, complex tear of the medial meniscus in the midportion and posterior horn.  This is associated with moderate degenerative change on the femoral condyle and the tibial surface.  Anterior cruciate ligament is loose, but intact.  Lateral compartment generally is within normal limits for his age with several frayed edges.  Using a combination of the full radial resector and the ArthroWand, the torn portion of the medial meniscus was resected back to a stable rim.  Patient tolerated the procedure quite well.  The joint was irrigated  multiple times. Portals were closed with 4-0 nylon.  The joint is infiltrated with 10 cc of Marcaine with epinephrine and 4 mg of morphine.  Soft, bulky dressing is applied.  Patient is returned to the recovery room in satisfactory condition, having tolerated the procedure quite well.          ______________________________ Margaretmary Eddy, MD     CS/MEDQ  D:  02/18/2015  T:  02/18/2015  Job:  [id number]

## 2015-02-25 DIAGNOSIS — S83231D Complex tear of medial meniscus, current injury, right knee, subsequent encounter: Secondary | ICD-10-CM | POA: Diagnosis not present

## 2015-03-10 DIAGNOSIS — M25561 Pain in right knee: Secondary | ICD-10-CM | POA: Diagnosis not present

## 2015-03-10 DIAGNOSIS — M25661 Stiffness of right knee, not elsewhere classified: Secondary | ICD-10-CM | POA: Diagnosis not present

## 2015-03-13 DIAGNOSIS — M25661 Stiffness of right knee, not elsewhere classified: Secondary | ICD-10-CM | POA: Diagnosis not present

## 2015-03-13 DIAGNOSIS — M25561 Pain in right knee: Secondary | ICD-10-CM | POA: Diagnosis not present

## 2015-03-17 DIAGNOSIS — M179 Osteoarthritis of knee, unspecified: Secondary | ICD-10-CM | POA: Diagnosis not present

## 2015-03-20 DIAGNOSIS — M25561 Pain in right knee: Secondary | ICD-10-CM | POA: Diagnosis not present

## 2015-03-20 DIAGNOSIS — M25661 Stiffness of right knee, not elsewhere classified: Secondary | ICD-10-CM | POA: Diagnosis not present

## 2015-03-21 DIAGNOSIS — H40003 Preglaucoma, unspecified, bilateral: Secondary | ICD-10-CM | POA: Diagnosis not present

## 2015-03-25 DIAGNOSIS — M25661 Stiffness of right knee, not elsewhere classified: Secondary | ICD-10-CM | POA: Diagnosis not present

## 2015-03-25 DIAGNOSIS — M25561 Pain in right knee: Secondary | ICD-10-CM | POA: Diagnosis not present

## 2015-03-27 DIAGNOSIS — M25661 Stiffness of right knee, not elsewhere classified: Secondary | ICD-10-CM | POA: Diagnosis not present

## 2015-03-27 DIAGNOSIS — M25561 Pain in right knee: Secondary | ICD-10-CM | POA: Diagnosis not present

## 2015-04-01 DIAGNOSIS — M25561 Pain in right knee: Secondary | ICD-10-CM | POA: Diagnosis not present

## 2015-04-01 DIAGNOSIS — M25661 Stiffness of right knee, not elsewhere classified: Secondary | ICD-10-CM | POA: Diagnosis not present

## 2015-04-14 DIAGNOSIS — M25561 Pain in right knee: Secondary | ICD-10-CM | POA: Diagnosis not present

## 2015-04-14 DIAGNOSIS — M25661 Stiffness of right knee, not elsewhere classified: Secondary | ICD-10-CM | POA: Diagnosis not present

## 2015-04-17 DIAGNOSIS — M25661 Stiffness of right knee, not elsewhere classified: Secondary | ICD-10-CM | POA: Diagnosis not present

## 2015-04-17 DIAGNOSIS — M25561 Pain in right knee: Secondary | ICD-10-CM | POA: Diagnosis not present

## 2015-04-21 DIAGNOSIS — M25561 Pain in right knee: Secondary | ICD-10-CM | POA: Diagnosis not present

## 2015-04-21 DIAGNOSIS — M25661 Stiffness of right knee, not elsewhere classified: Secondary | ICD-10-CM | POA: Diagnosis not present

## 2015-04-23 DIAGNOSIS — M25661 Stiffness of right knee, not elsewhere classified: Secondary | ICD-10-CM | POA: Diagnosis not present

## 2015-04-23 DIAGNOSIS — M25561 Pain in right knee: Secondary | ICD-10-CM | POA: Diagnosis not present

## 2015-04-28 DIAGNOSIS — M79675 Pain in left toe(s): Secondary | ICD-10-CM | POA: Diagnosis not present

## 2015-04-28 DIAGNOSIS — B351 Tinea unguium: Secondary | ICD-10-CM | POA: Diagnosis not present

## 2015-04-28 DIAGNOSIS — M79674 Pain in right toe(s): Secondary | ICD-10-CM | POA: Diagnosis not present

## 2015-05-07 DIAGNOSIS — M25661 Stiffness of right knee, not elsewhere classified: Secondary | ICD-10-CM | POA: Diagnosis not present

## 2015-05-07 DIAGNOSIS — M25561 Pain in right knee: Secondary | ICD-10-CM | POA: Diagnosis not present

## 2015-05-09 DIAGNOSIS — M25561 Pain in right knee: Secondary | ICD-10-CM | POA: Diagnosis not present

## 2015-05-09 DIAGNOSIS — M25661 Stiffness of right knee, not elsewhere classified: Secondary | ICD-10-CM | POA: Diagnosis not present

## 2015-05-12 DIAGNOSIS — M25561 Pain in right knee: Secondary | ICD-10-CM | POA: Diagnosis not present

## 2015-05-14 DIAGNOSIS — M25661 Stiffness of right knee, not elsewhere classified: Secondary | ICD-10-CM | POA: Diagnosis not present

## 2015-05-14 DIAGNOSIS — M25561 Pain in right knee: Secondary | ICD-10-CM | POA: Diagnosis not present

## 2015-05-19 DIAGNOSIS — M25561 Pain in right knee: Secondary | ICD-10-CM | POA: Diagnosis not present

## 2015-05-19 DIAGNOSIS — M25661 Stiffness of right knee, not elsewhere classified: Secondary | ICD-10-CM | POA: Diagnosis not present

## 2015-05-22 DIAGNOSIS — M25661 Stiffness of right knee, not elsewhere classified: Secondary | ICD-10-CM | POA: Diagnosis not present

## 2015-05-22 DIAGNOSIS — M25561 Pain in right knee: Secondary | ICD-10-CM | POA: Diagnosis not present

## 2015-05-26 DIAGNOSIS — M25661 Stiffness of right knee, not elsewhere classified: Secondary | ICD-10-CM | POA: Diagnosis not present

## 2015-05-26 DIAGNOSIS — M25561 Pain in right knee: Secondary | ICD-10-CM | POA: Diagnosis not present

## 2015-05-27 DIAGNOSIS — N400 Enlarged prostate without lower urinary tract symptoms: Secondary | ICD-10-CM | POA: Diagnosis not present

## 2015-05-29 DIAGNOSIS — M25661 Stiffness of right knee, not elsewhere classified: Secondary | ICD-10-CM | POA: Diagnosis not present

## 2015-05-29 DIAGNOSIS — M25561 Pain in right knee: Secondary | ICD-10-CM | POA: Diagnosis not present

## 2015-06-03 DIAGNOSIS — M1711 Unilateral primary osteoarthritis, right knee: Secondary | ICD-10-CM | POA: Diagnosis not present

## 2015-06-03 DIAGNOSIS — N401 Enlarged prostate with lower urinary tract symptoms: Secondary | ICD-10-CM | POA: Diagnosis not present

## 2015-06-03 DIAGNOSIS — R3915 Urgency of urination: Secondary | ICD-10-CM | POA: Diagnosis not present

## 2015-06-03 DIAGNOSIS — R972 Elevated prostate specific antigen [PSA]: Secondary | ICD-10-CM | POA: Diagnosis not present

## 2015-06-03 DIAGNOSIS — N3281 Overactive bladder: Secondary | ICD-10-CM | POA: Diagnosis not present

## 2015-06-16 DIAGNOSIS — M25661 Stiffness of right knee, not elsewhere classified: Secondary | ICD-10-CM | POA: Diagnosis not present

## 2015-06-16 DIAGNOSIS — M25561 Pain in right knee: Secondary | ICD-10-CM | POA: Diagnosis not present

## 2015-06-20 DIAGNOSIS — M1990 Unspecified osteoarthritis, unspecified site: Secondary | ICD-10-CM | POA: Diagnosis not present

## 2015-06-20 DIAGNOSIS — Z79899 Other long term (current) drug therapy: Secondary | ICD-10-CM | POA: Diagnosis not present

## 2015-06-20 DIAGNOSIS — I10 Essential (primary) hypertension: Secondary | ICD-10-CM | POA: Diagnosis not present

## 2015-06-20 DIAGNOSIS — E780 Pure hypercholesterolemia: Secondary | ICD-10-CM | POA: Diagnosis not present

## 2015-06-20 DIAGNOSIS — M25661 Stiffness of right knee, not elsewhere classified: Secondary | ICD-10-CM | POA: Diagnosis not present

## 2015-06-20 DIAGNOSIS — M25561 Pain in right knee: Secondary | ICD-10-CM | POA: Diagnosis not present

## 2015-06-24 DIAGNOSIS — M25661 Stiffness of right knee, not elsewhere classified: Secondary | ICD-10-CM | POA: Diagnosis not present

## 2015-06-24 DIAGNOSIS — M25561 Pain in right knee: Secondary | ICD-10-CM | POA: Diagnosis not present

## 2015-06-27 DIAGNOSIS — M25561 Pain in right knee: Secondary | ICD-10-CM | POA: Diagnosis not present

## 2015-06-27 DIAGNOSIS — M25661 Stiffness of right knee, not elsewhere classified: Secondary | ICD-10-CM | POA: Diagnosis not present

## 2015-07-01 DIAGNOSIS — M25561 Pain in right knee: Secondary | ICD-10-CM | POA: Diagnosis not present

## 2015-07-01 DIAGNOSIS — M25661 Stiffness of right knee, not elsewhere classified: Secondary | ICD-10-CM | POA: Diagnosis not present

## 2015-07-03 DIAGNOSIS — M25661 Stiffness of right knee, not elsewhere classified: Secondary | ICD-10-CM | POA: Diagnosis not present

## 2015-07-03 DIAGNOSIS — M25561 Pain in right knee: Secondary | ICD-10-CM | POA: Diagnosis not present

## 2015-07-17 DIAGNOSIS — K130 Diseases of lips: Secondary | ICD-10-CM | POA: Diagnosis not present

## 2015-07-23 DIAGNOSIS — Z79899 Other long term (current) drug therapy: Secondary | ICD-10-CM | POA: Diagnosis not present

## 2015-07-23 DIAGNOSIS — I10 Essential (primary) hypertension: Secondary | ICD-10-CM | POA: Diagnosis not present

## 2015-07-23 DIAGNOSIS — Z23 Encounter for immunization: Secondary | ICD-10-CM | POA: Diagnosis not present

## 2015-07-23 DIAGNOSIS — E7800 Pure hypercholesterolemia, unspecified: Secondary | ICD-10-CM | POA: Diagnosis not present

## 2015-07-24 DIAGNOSIS — I10 Essential (primary) hypertension: Secondary | ICD-10-CM | POA: Diagnosis not present

## 2015-07-24 DIAGNOSIS — E7800 Pure hypercholesterolemia, unspecified: Secondary | ICD-10-CM | POA: Diagnosis not present

## 2015-07-24 DIAGNOSIS — Z79899 Other long term (current) drug therapy: Secondary | ICD-10-CM | POA: Diagnosis not present

## 2015-07-29 DIAGNOSIS — B351 Tinea unguium: Secondary | ICD-10-CM | POA: Diagnosis not present

## 2015-07-29 DIAGNOSIS — M79675 Pain in left toe(s): Secondary | ICD-10-CM | POA: Diagnosis not present

## 2015-07-29 DIAGNOSIS — M79674 Pain in right toe(s): Secondary | ICD-10-CM | POA: Diagnosis not present

## 2015-08-19 DIAGNOSIS — M1711 Unilateral primary osteoarthritis, right knee: Secondary | ICD-10-CM | POA: Diagnosis not present

## 2015-09-04 DIAGNOSIS — D485 Neoplasm of uncertain behavior of skin: Secondary | ICD-10-CM | POA: Diagnosis not present

## 2015-09-04 DIAGNOSIS — D229 Melanocytic nevi, unspecified: Secondary | ICD-10-CM | POA: Diagnosis not present

## 2015-09-04 DIAGNOSIS — L820 Inflamed seborrheic keratosis: Secondary | ICD-10-CM | POA: Diagnosis not present

## 2015-09-04 DIAGNOSIS — D1800 Hemangioma unspecified site: Secondary | ICD-10-CM | POA: Diagnosis not present

## 2015-09-04 DIAGNOSIS — L570 Actinic keratosis: Secondary | ICD-10-CM | POA: Diagnosis not present

## 2015-09-04 DIAGNOSIS — Z1283 Encounter for screening for malignant neoplasm of skin: Secondary | ICD-10-CM | POA: Diagnosis not present

## 2015-09-04 DIAGNOSIS — L720 Epidermal cyst: Secondary | ICD-10-CM | POA: Diagnosis not present

## 2015-09-04 DIAGNOSIS — Z85828 Personal history of other malignant neoplasm of skin: Secondary | ICD-10-CM | POA: Diagnosis not present

## 2015-09-12 DIAGNOSIS — H40003 Preglaucoma, unspecified, bilateral: Secondary | ICD-10-CM | POA: Diagnosis not present

## 2015-09-16 DIAGNOSIS — H40003 Preglaucoma, unspecified, bilateral: Secondary | ICD-10-CM | POA: Diagnosis not present

## 2015-09-19 DIAGNOSIS — Z79899 Other long term (current) drug therapy: Secondary | ICD-10-CM | POA: Diagnosis not present

## 2015-09-19 DIAGNOSIS — I10 Essential (primary) hypertension: Secondary | ICD-10-CM | POA: Diagnosis not present

## 2015-09-19 DIAGNOSIS — M159 Polyosteoarthritis, unspecified: Secondary | ICD-10-CM | POA: Diagnosis not present

## 2015-09-19 DIAGNOSIS — E7800 Pure hypercholesterolemia, unspecified: Secondary | ICD-10-CM | POA: Diagnosis not present

## 2015-09-19 DIAGNOSIS — K219 Gastro-esophageal reflux disease without esophagitis: Secondary | ICD-10-CM | POA: Diagnosis not present

## 2015-09-19 DIAGNOSIS — D649 Anemia, unspecified: Secondary | ICD-10-CM | POA: Diagnosis not present

## 2015-09-19 DIAGNOSIS — E119 Type 2 diabetes mellitus without complications: Secondary | ICD-10-CM | POA: Diagnosis not present

## 2015-10-01 ENCOUNTER — Encounter
Admission: RE | Admit: 2015-10-01 | Discharge: 2015-10-01 | Disposition: A | Discharge status: Home / Self Care | Payer: Commercial Managed Care - HMO | Source: Ambulatory Visit | Attending: Orthopedic Surgery | Admitting: Orthopedic Surgery

## 2015-10-01 DIAGNOSIS — Z01812 Encounter for preprocedural laboratory examination: Secondary | ICD-10-CM | POA: Diagnosis not present

## 2015-10-01 LAB — SEDIMENTATION RATE: SED RATE: 8 mm/h (ref 0–20)

## 2015-10-01 LAB — CBC
HCT: 42.6 % (ref 40.0–52.0)
HEMOGLOBIN: 14.7 g/dL (ref 13.0–18.0)
MCH: 32.3 pg (ref 26.0–34.0)
MCHC: 34.4 g/dL (ref 32.0–36.0)
MCV: 93.8 fL (ref 80.0–100.0)
Platelets: 229 10*3/uL (ref 150–440)
RBC: 4.54 MIL/uL (ref 4.40–5.90)
RDW: 13 % (ref 11.5–14.5)
WBC: 5.5 10*3/uL (ref 3.8–10.6)

## 2015-10-01 LAB — PROTIME-INR
INR: 0.99
PROTHROMBIN TIME: 13.3 s (ref 11.4–15.0)

## 2015-10-01 LAB — URINALYSIS COMPLETE WITH MICROSCOPIC (ARMC ONLY)
Bacteria, UA: NONE SEEN
Bilirubin Urine: NEGATIVE
Glucose, UA: NEGATIVE mg/dL
Hgb urine dipstick: NEGATIVE
KETONES UR: NEGATIVE mg/dL
Leukocytes, UA: NEGATIVE
Nitrite: NEGATIVE
PROTEIN: NEGATIVE mg/dL
Specific Gravity, Urine: 1.021 (ref 1.005–1.030)
pH: 6 (ref 5.0–8.0)

## 2015-10-01 LAB — TYPE AND SCREEN
ABO/RH(D): B POS
ANTIBODY SCREEN: NEGATIVE

## 2015-10-01 LAB — BASIC METABOLIC PANEL
ANION GAP: 5 (ref 5–15)
BUN: 18 mg/dL (ref 6–20)
CALCIUM: 9.2 mg/dL (ref 8.9–10.3)
CO2: 29 mmol/L (ref 22–32)
Chloride: 108 mmol/L (ref 101–111)
Creatinine, Ser: 0.87 mg/dL (ref 0.61–1.24)
GFR calc Af Amer: >60 mL/min (ref >60)
Glucose, Bld: 86 mg/dL (ref 65–99)
POTASSIUM: 3.8 mmol/L (ref 3.5–5.1)
SODIUM: 142 mmol/L (ref 135–145)

## 2015-10-01 LAB — SURGICAL PCR SCREEN
MRSA, PCR: NEGATIVE
STAPHYLOCOCCUS AUREUS: NEGATIVE

## 2015-10-01 LAB — APTT: APTT: 28 s (ref 24–36)

## 2015-10-01 LAB — ABO/RH: ABO/RH(D): B POS

## 2015-10-01 NOTE — Patient Instructions (Signed)
  Your procedure is scheduled on: Monday 10/13/2015 Report to Day Surgery. 2ND FLOOR MEDICAL MALL ENTRANCE To find out your arrival time please call [phone] between 1PM - 3PM on Friday 10/11/2015.  Remember: Instructions that are not followed completely may result in serious medical risk, up to and including death, or upon the discretion of your surgeon and anesthesiologist your surgery may need to be rescheduled.    __X__ 1. Do not eat food or drink liquids after midnight. No gum chewing or hard candies.     __X__ 2. No Alcohol for 24 hours before or after surgery.   ____ 3. Bring all medications with you on the day of surgery if instructed.    __X__ 4. Notify your doctor if there is any change in your medical condition     (cold, fever, infections).     Do not wear jewelry, make-up, hairpins, clips or nail polish.  Do not wear lotions, powders, or perfumes.   Do not shave 48 hours prior to surgery. Men may shave face and neck.  Do not bring valuables to the hospital.    [facility] is not responsible for any belongings or valuables.               Contacts, dentures or bridgework may not be worn into surgery.  Leave your suitcase in the car. After surgery it may be brought to your room.  For patients admitted to the hospital, discharge time is determined by your                treatment team.   Patients discharged the day of surgery will not be allowed to drive home.   Please read over the following fact sheets that you were given:   MRSA Information and Surgical Site Infection Prevention   __X__ Take these medicines the morning of surgery with A SIP OF WATER:    1. OMEPRAZOLE  2.   3.   4.  5.  6.  ____ Fleet Enema (as directed)   __X_ Use CHG Soap as directed  ____ Use inhalers on the day of surgery  ____ Stop metformin 2 days prior to surgery    ____ Take 1/2 of usual insulin dose the night before surgery and none on the morning of surgery.   __X_ Stop  Coumadin/Plavix/aspirin on 12/31  __X__ Stop Anti-inflammatories on 12/31 (ALEVE, ADVIL, IBUPROFEN)   __X__ Stop supplements until after surgery.    ____ Bring C-Pap to the hospital.

## 2015-10-02 LAB — URINE CULTURE: Culture: NO GROWTH

## 2015-10-03 DIAGNOSIS — M79675 Pain in left toe(s): Secondary | ICD-10-CM | POA: Diagnosis not present

## 2015-10-03 DIAGNOSIS — B351 Tinea unguium: Secondary | ICD-10-CM | POA: Diagnosis not present

## 2015-10-03 DIAGNOSIS — M79674 Pain in right toe(s): Secondary | ICD-10-CM | POA: Diagnosis not present

## 2015-10-13 ENCOUNTER — Encounter: Admission: RE | Payer: Self-pay | Source: Ambulatory Visit

## 2015-10-13 ENCOUNTER — Inpatient Hospital Stay
Admission: RE | Admit: 2015-10-13 | Payer: Commercial Managed Care - HMO | Source: Ambulatory Visit | Admitting: Orthopedic Surgery

## 2015-10-13 SURGERY — ARTHROPLASTY, KNEE, TOTAL, USING IMAGELESS COMPUTER-ASSISTED NAVIGATION
Anesthesia: Choice | Laterality: Right

## 2015-10-13 MED ORDER — CEFAZOLIN SODIUM-DEXTROSE 2-3 GM-% IV SOLR: 2.0000 g | Freq: Once | INTRAVENOUS | Status: DC

## 2015-10-13 MED ORDER — TRANEXAMIC ACID 1000 MG/10ML IV SOLN
1000.0000 mg | INTRAVENOUS | Status: DC
  Filled 2015-10-13: qty 10

## 2015-10-13 MED ORDER — SODIUM CHLORIDE 0.9 % IJ SOLN
INTRAMUSCULAR | Status: AC
  Filled 2015-10-13: qty 50

## 2015-10-13 MED ORDER — NEOMYCIN-POLYMYXIN B GU 40-200000 IR SOLN
Status: AC
  Filled 2015-10-13: qty 20

## 2015-10-13 MED ORDER — LACTATED RINGERS IV SOLN: INTRAVENOUS | Status: DC

## 2015-10-13 MED ORDER — BUPIVACAINE LIPOSOME 1.3 % IJ SUSP
INTRAMUSCULAR | Status: AC
  Filled 2015-10-13: qty 20

## 2015-10-13 MED ORDER — ACETAMINOPHEN 10 MG/ML IV SOLN
INTRAVENOUS | Status: AC
  Filled 2015-10-13: qty 100

## 2015-10-13 MED ORDER — BUPIVACAINE-EPINEPHRINE (PF) 0.25% -1:200000 IJ SOLN
INTRAMUSCULAR | Status: AC
  Filled 2015-10-13: qty 30

## 2015-10-20 ENCOUNTER — Other Ambulatory Visit: Payer: Commercial Managed Care - HMO

## 2015-10-22 DIAGNOSIS — I10 Essential (primary) hypertension: Secondary | ICD-10-CM | POA: Diagnosis not present

## 2015-10-22 DIAGNOSIS — E7800 Pure hypercholesterolemia, unspecified: Secondary | ICD-10-CM | POA: Diagnosis not present

## 2015-10-22 DIAGNOSIS — R0789 Other chest pain: Secondary | ICD-10-CM | POA: Diagnosis not present

## 2015-10-27 ENCOUNTER — Encounter: Payer: Self-pay | Admitting: Orthopedic Surgery

## 2015-10-27 ENCOUNTER — Inpatient Hospital Stay
Admission: AD | Admit: 2015-10-27 | Discharge: 2015-10-29 | DRG: 470 | Disposition: A | Discharge status: Home Health | Payer: Commercial Managed Care - HMO | Source: Ambulatory Visit | Attending: Orthopedic Surgery | Admitting: Orthopedic Surgery

## 2015-10-27 ENCOUNTER — Inpatient Hospital Stay: Payer: Commercial Managed Care - HMO | Admitting: Certified Registered Nurse Anesthetist

## 2015-10-27 ENCOUNTER — Inpatient Hospital Stay: Payer: Commercial Managed Care - HMO

## 2015-10-27 ENCOUNTER — Encounter
Admission: AD | Disposition: A | Discharge status: Home Health | Payer: Self-pay | Source: Ambulatory Visit | Attending: Orthopedic Surgery

## 2015-10-27 DIAGNOSIS — M6281 Muscle weakness (generalized): Secondary | ICD-10-CM | POA: Diagnosis not present

## 2015-10-27 DIAGNOSIS — M25761 Osteophyte, right knee: Secondary | ICD-10-CM | POA: Diagnosis not present

## 2015-10-27 DIAGNOSIS — Z471 Aftercare following joint replacement surgery: Secondary | ICD-10-CM | POA: Diagnosis not present

## 2015-10-27 DIAGNOSIS — Z808 Family history of malignant neoplasm of other organs or systems: Secondary | ICD-10-CM

## 2015-10-27 DIAGNOSIS — Z8249 Family history of ischemic heart disease and other diseases of the circulatory system: Secondary | ICD-10-CM | POA: Diagnosis not present

## 2015-10-27 DIAGNOSIS — M1711 Unilateral primary osteoarthritis, right knee: Secondary | ICD-10-CM | POA: Diagnosis not present

## 2015-10-27 DIAGNOSIS — M179 Osteoarthritis of knee, unspecified: Secondary | ICD-10-CM | POA: Diagnosis not present

## 2015-10-27 DIAGNOSIS — Z79899 Other long term (current) drug therapy: Secondary | ICD-10-CM

## 2015-10-27 DIAGNOSIS — Z96651 Presence of right artificial knee joint: Secondary | ICD-10-CM | POA: Diagnosis not present

## 2015-10-27 DIAGNOSIS — N400 Enlarged prostate without lower urinary tract symptoms: Secondary | ICD-10-CM | POA: Diagnosis present

## 2015-10-27 DIAGNOSIS — Z823 Family history of stroke: Secondary | ICD-10-CM

## 2015-10-27 DIAGNOSIS — I10 Essential (primary) hypertension: Secondary | ICD-10-CM | POA: Diagnosis not present

## 2015-10-27 DIAGNOSIS — K219 Gastro-esophageal reflux disease without esophagitis: Secondary | ICD-10-CM | POA: Diagnosis not present

## 2015-10-27 DIAGNOSIS — Z8042 Family history of malignant neoplasm of prostate: Secondary | ICD-10-CM | POA: Diagnosis not present

## 2015-10-27 DIAGNOSIS — Z7982 Long term (current) use of aspirin: Secondary | ICD-10-CM

## 2015-10-27 DIAGNOSIS — Z96659 Presence of unspecified artificial knee joint: Secondary | ICD-10-CM

## 2015-10-27 HISTORY — PX: KNEE ARTHROPLASTY: SHX992

## 2015-10-27 LAB — CREATININE, SERUM: Creatinine, Ser: 0.8 mg/dL (ref 0.61–1.24)

## 2015-10-27 LAB — TYPE AND SCREEN
ABO/RH(D): B POS
ANTIBODY SCREEN: NEGATIVE

## 2015-10-27 SURGERY — ARTHROPLASTY, KNEE, TOTAL, USING IMAGELESS COMPUTER-ASSISTED NAVIGATION
Anesthesia: Spinal | Site: Knee | Laterality: Right | Wound class: Clean

## 2015-10-27 MED ORDER — FENTANYL CITRATE (PF) 100 MCG/2ML IJ SOLN
25.0000 ug | INTRAMUSCULAR | Status: DC | PRN
  Administered 2015-10-27 (×4): 25 ug via INTRAVENOUS

## 2015-10-27 MED ORDER — MAGNESIUM HYDROXIDE 400 MG/5ML PO SUSP
30.0000 mL | Freq: Every day | ORAL | Status: DC | PRN
  Administered 2015-10-28 – 2015-10-29 (×2): 30 mL via ORAL
  Filled 2015-10-27 (×2): qty 30

## 2015-10-27 MED ORDER — KETAMINE HCL 10 MG/ML IJ SOLN
INTRAMUSCULAR | Status: DC | PRN
  Administered 2015-10-27: 50 mg via INTRAVENOUS

## 2015-10-27 MED ORDER — MECLIZINE HCL 25 MG PO TABS: 25.0000 mg | ORAL_TABLET | Freq: Three times a day (TID) | ORAL | Status: DC | PRN

## 2015-10-27 MED ORDER — EPHEDRINE SULFATE 50 MG/ML IJ SOLN
INTRAMUSCULAR | Status: DC | PRN
  Administered 2015-10-27: 5 mg via INTRAVENOUS

## 2015-10-27 MED ORDER — SODIUM CHLORIDE 0.9 % IJ SOLN
INTRAMUSCULAR | Status: AC
  Filled 2015-10-27: qty 50

## 2015-10-27 MED ORDER — ATORVASTATIN CALCIUM 10 MG PO TABS
10.0000 mg | ORAL_TABLET | Freq: Every day | ORAL | Status: DC
  Administered 2015-10-27 – 2015-10-28 (×2): 10 mg via ORAL
  Filled 2015-10-27 (×2): qty 1

## 2015-10-27 MED ORDER — FERROUS SULFATE 325 (65 FE) MG PO TABS
325.0000 mg | ORAL_TABLET | Freq: Two times a day (BID) | ORAL | Status: DC
  Administered 2015-10-27 – 2015-10-29 (×4): 325 mg via ORAL
  Filled 2015-10-27 (×4): qty 1

## 2015-10-27 MED ORDER — FLEET ENEMA 7-19 GM/118ML RE ENEM: 1.0000 | ENEMA | Freq: Once | RECTAL | Status: DC | PRN

## 2015-10-27 MED ORDER — ACETAMINOPHEN 10 MG/ML IV SOLN
1000.0000 mg | Freq: Four times a day (QID) | INTRAVENOUS | Status: AC
  Administered 2015-10-27 – 2015-10-28 (×3): 1000 mg via INTRAVENOUS
  Filled 2015-10-27 (×5): qty 100

## 2015-10-27 MED ORDER — ADULT MULTIVITAMIN W/MINERALS CH
1.0000 | ORAL_TABLET | Freq: Every day | ORAL | Status: DC
  Administered 2015-10-28 – 2015-10-29 (×2): 1 via ORAL
  Filled 2015-10-27 (×2): qty 1

## 2015-10-27 MED ORDER — PHENOL 1.4 % MT LIQD
1.0000 | OROMUCOSAL | Status: DC | PRN
  Filled 2015-10-27: qty 177

## 2015-10-27 MED ORDER — VITAMIN E 180 MG (400 UNIT) PO CAPS
400.0000 [IU] | ORAL_CAPSULE | Freq: Every day | ORAL | Status: DC
  Administered 2015-10-28 – 2015-10-29 (×2): 400 [IU] via ORAL
  Filled 2015-10-27 (×2): qty 1

## 2015-10-27 MED ORDER — CEFAZOLIN SODIUM-DEXTROSE 2-3 GM-% IV SOLR
INTRAVENOUS | Status: AC
  Filled 2015-10-27: qty 50

## 2015-10-27 MED ORDER — MENTHOL 3 MG MT LOZG
1.0000 | LOZENGE | OROMUCOSAL | Status: DC | PRN
  Filled 2015-10-27 (×2): qty 9

## 2015-10-27 MED ORDER — ACETAMINOPHEN 10 MG/ML IV SOLN
INTRAVENOUS | Status: DC | PRN
  Administered 2015-10-27: 1000 mg via INTRAVENOUS

## 2015-10-27 MED ORDER — TRANEXAMIC ACID 1000 MG/10ML IV SOLN
1000.0000 mg | Freq: Once | INTRAVENOUS | Status: AC
  Administered 2015-10-27: 1000 mg via INTRAVENOUS
  Filled 2015-10-27: qty 10

## 2015-10-27 MED ORDER — FENTANYL CITRATE (PF) 100 MCG/2ML IJ SOLN
INTRAMUSCULAR | Status: AC
  Administered 2015-10-27: 25 ug via INTRAVENOUS
  Filled 2015-10-27: qty 2

## 2015-10-27 MED ORDER — MIDAZOLAM HCL 5 MG/5ML IJ SOLN
INTRAMUSCULAR | Status: DC | PRN
  Administered 2015-10-27 (×2): 1 mg via INTRAVENOUS

## 2015-10-27 MED ORDER — BUPIVACAINE LIPOSOME 1.3 % IJ SUSP
INTRAMUSCULAR | Status: DC | PRN
  Administered 2015-10-27: 60 mL

## 2015-10-27 MED ORDER — MORPHINE SULFATE (PF) 2 MG/ML IV SOLN: 2.0000 mg | INTRAVENOUS | Status: DC | PRN

## 2015-10-27 MED ORDER — OMEGA 3 500 500 MG PO CAPS: ORAL_CAPSULE | Freq: Every day | ORAL | Status: DC

## 2015-10-27 MED ORDER — ACETAMINOPHEN 650 MG RE SUPP: 650.0000 mg | Freq: Four times a day (QID) | RECTAL | Status: DC | PRN

## 2015-10-27 MED ORDER — POTASSIUM CHLORIDE CRYS ER 10 MEQ PO TBCR
10.0000 meq | EXTENDED_RELEASE_TABLET | Freq: Every day | ORAL | Status: DC
  Administered 2015-10-28 – 2015-10-29 (×2): 10 meq via ORAL
  Filled 2015-10-27 (×2): qty 1

## 2015-10-27 MED ORDER — DIPHENHYDRAMINE HCL 12.5 MG/5ML PO ELIX: 12.5000 mg | ORAL_SOLUTION | ORAL | Status: DC | PRN

## 2015-10-27 MED ORDER — BUPIVACAINE-EPINEPHRINE 0.25% -1:200000 IJ SOLN
INTRAMUSCULAR | Status: DC | PRN
  Administered 2015-10-27: 30 mL

## 2015-10-27 MED ORDER — VITAMIN D 1000 UNITS PO TABS
2000.0000 [IU] | ORAL_TABLET | Freq: Every day | ORAL | Status: DC
  Administered 2015-10-28 – 2015-10-29 (×2): 2000 [IU] via ORAL
  Filled 2015-10-27 (×2): qty 2

## 2015-10-27 MED ORDER — METOCLOPRAMIDE HCL 10 MG PO TABS
10.0000 mg | ORAL_TABLET | Freq: Three times a day (TID) | ORAL | Status: DC
  Administered 2015-10-27 – 2015-10-29 (×7): 10 mg via ORAL
  Filled 2015-10-27 (×7): qty 1

## 2015-10-27 MED ORDER — BUPIVACAINE HCL (PF) 0.5 % IJ SOLN
INTRAMUSCULAR | Status: DC | PRN
  Administered 2015-10-27: 3 mL

## 2015-10-27 MED ORDER — BUPIVACAINE LIPOSOME 1.3 % IJ SUSP
INTRAMUSCULAR | Status: AC
  Filled 2015-10-27: qty 20

## 2015-10-27 MED ORDER — ACYCLOVIR 5 % EX OINT
1.0000 "application " | TOPICAL_OINTMENT | Freq: Every day | CUTANEOUS | Status: DC | PRN
  Filled 2015-10-27: qty 15

## 2015-10-27 MED ORDER — CEFAZOLIN SODIUM-DEXTROSE 2-3 GM-% IV SOLR
2.0000 g | Freq: Four times a day (QID) | INTRAVENOUS | Status: AC
  Administered 2015-10-27 – 2015-10-28 (×4): 2 g via INTRAVENOUS
  Filled 2015-10-27 (×4): qty 50

## 2015-10-27 MED ORDER — BUPIVACAINE-EPINEPHRINE (PF) 0.25% -1:200000 IJ SOLN
INTRAMUSCULAR | Status: AC
  Filled 2015-10-27: qty 30

## 2015-10-27 MED ORDER — PANTOPRAZOLE SODIUM 40 MG PO TBEC
40.0000 mg | DELAYED_RELEASE_TABLET | Freq: Two times a day (BID) | ORAL | Status: DC
  Administered 2015-10-27 – 2015-10-29 (×4): 40 mg via ORAL
  Filled 2015-10-27 (×4): qty 1

## 2015-10-27 MED ORDER — BISACODYL 10 MG RE SUPP: 10.0000 mg | Freq: Every day | RECTAL | Status: DC | PRN

## 2015-10-27 MED ORDER — PROPOFOL 500 MG/50ML IV EMUL
INTRAVENOUS | Status: DC | PRN
  Administered 2015-10-27: 40 ug/kg/min via INTRAVENOUS

## 2015-10-27 MED ORDER — SODIUM CHLORIDE 0.9 % IV SOLN
INTRAVENOUS | Status: DC
  Administered 2015-10-27 – 2015-10-28 (×2): via INTRAVENOUS

## 2015-10-27 MED ORDER — NEOMYCIN-POLYMYXIN B GU 40-200000 IR SOLN
Status: DC | PRN
  Administered 2015-10-27: 14 mL

## 2015-10-27 MED ORDER — PROPOFOL 10 MG/ML IV BOLUS
INTRAVENOUS | Status: DC | PRN
  Administered 2015-10-27 (×3): 20 mg via INTRAVENOUS

## 2015-10-27 MED ORDER — SODIUM CHLORIDE 0.9 % IJ SOLN
INTRAMUSCULAR | Status: AC
  Filled 2015-10-27: qty 6

## 2015-10-27 MED ORDER — OXYCODONE HCL 5 MG PO TABS
5.0000 mg | ORAL_TABLET | ORAL | Status: DC | PRN
  Administered 2015-10-27 (×2): 5 mg via ORAL
  Administered 2015-10-27 – 2015-10-28 (×2): 10 mg via ORAL
  Administered 2015-10-29: 5 mg via ORAL
  Filled 2015-10-27: qty 2
  Filled 2015-10-27: qty 1
  Filled 2015-10-27: qty 2
  Filled 2015-10-27: qty 1
  Filled 2015-10-27: qty 2
  Filled 2015-10-27: qty 1

## 2015-10-27 MED ORDER — CEFAZOLIN SODIUM-DEXTROSE 2-3 GM-% IV SOLR
2.0000 g | Freq: Once | INTRAVENOUS | Status: AC
  Administered 2015-10-27: 2 g via INTRAVENOUS

## 2015-10-27 MED ORDER — KETOCONAZOLE 2 % EX CREA
1.0000 "application " | TOPICAL_CREAM | Freq: Every day | CUTANEOUS | Status: DC | PRN
  Filled 2015-10-27: qty 15

## 2015-10-27 MED ORDER — DONEPEZIL HCL 5 MG PO TABS
5.0000 mg | ORAL_TABLET | Freq: Every day | ORAL | Status: DC
  Administered 2015-10-27 – 2015-10-28 (×2): 5 mg via ORAL
  Filled 2015-10-27 (×3): qty 1

## 2015-10-27 MED ORDER — ALUM & MAG HYDROXIDE-SIMETH 200-200-20 MG/5ML PO SUSP: 30.0000 mL | ORAL | Status: DC | PRN

## 2015-10-27 MED ORDER — ACETAMINOPHEN 10 MG/ML IV SOLN
INTRAVENOUS | Status: AC
  Filled 2015-10-27: qty 100

## 2015-10-27 MED ORDER — ONDANSETRON HCL 4 MG PO TABS: 4.0000 mg | ORAL_TABLET | Freq: Four times a day (QID) | ORAL | Status: DC | PRN

## 2015-10-27 MED ORDER — ONDANSETRON HCL 4 MG/2ML IJ SOLN: 4.0000 mg | Freq: Four times a day (QID) | INTRAMUSCULAR | Status: DC | PRN

## 2015-10-27 MED ORDER — OMEGA-3-ACID ETHYL ESTERS 1 G PO CAPS
1.0000 g | ORAL_CAPSULE | Freq: Every day | ORAL | Status: DC
  Administered 2015-10-27 – 2015-10-28 (×2): 1 g via ORAL
  Filled 2015-10-27 (×2): qty 1

## 2015-10-27 MED ORDER — ONDANSETRON HCL 4 MG/2ML IJ SOLN: 4.0000 mg | Freq: Once | INTRAMUSCULAR | Status: DC | PRN

## 2015-10-27 MED ORDER — FENTANYL CITRATE (PF) 100 MCG/2ML IJ SOLN
INTRAMUSCULAR | Status: DC | PRN
  Administered 2015-10-27 (×4): 25 ug via INTRAVENOUS

## 2015-10-27 MED ORDER — ENOXAPARIN SODIUM 30 MG/0.3ML ~~LOC~~ SOLN
30.0000 mg | Freq: Two times a day (BID) | SUBCUTANEOUS | Status: DC
  Administered 2015-10-28 – 2015-10-29 (×3): 30 mg via SUBCUTANEOUS
  Filled 2015-10-27 (×3): qty 0.3

## 2015-10-27 MED ORDER — SENNOSIDES-DOCUSATE SODIUM 8.6-50 MG PO TABS
1.0000 | ORAL_TABLET | Freq: Two times a day (BID) | ORAL | Status: DC
  Administered 2015-10-27 – 2015-10-29 (×4): 1 via ORAL
  Filled 2015-10-27 (×4): qty 1

## 2015-10-27 MED ORDER — LACTATED RINGERS IV SOLN
INTRAVENOUS | Status: DC | PRN
  Administered 2015-10-27 (×2): via INTRAVENOUS

## 2015-10-27 MED ORDER — TRANEXAMIC ACID 1000 MG/10ML IV SOLN
1000.0000 mg | INTRAVENOUS | Status: AC
  Administered 2015-10-27: 1000 mg via INTRAVENOUS
  Filled 2015-10-27: qty 10

## 2015-10-27 MED ORDER — VALACYCLOVIR HCL 500 MG PO TABS: 500.0000 mg | ORAL_TABLET | Freq: Every day | ORAL | Status: DC | PRN

## 2015-10-27 MED ORDER — SUCRALFATE 1 G PO TABS
1.0000 g | ORAL_TABLET | Freq: Every day | ORAL | Status: DC
  Administered 2015-10-28 – 2015-10-29 (×2): 1 g via ORAL
  Filled 2015-10-27 (×3): qty 1

## 2015-10-27 MED ORDER — ACETAMINOPHEN 325 MG PO TABS: 650.0000 mg | ORAL_TABLET | Freq: Four times a day (QID) | ORAL | Status: DC | PRN

## 2015-10-27 MED ORDER — POTASSIUM GLUCONATE 595 MG PO CAPS: 1.0000 | ORAL_CAPSULE | Freq: Every day | ORAL | Status: DC

## 2015-10-27 MED ORDER — CELECOXIB 200 MG PO CAPS
200.0000 mg | ORAL_CAPSULE | Freq: Two times a day (BID) | ORAL | Status: DC
  Administered 2015-10-27 – 2015-10-29 (×4): 200 mg via ORAL
  Filled 2015-10-27 (×4): qty 1

## 2015-10-27 MED ORDER — TRAMADOL HCL 50 MG PO TABS
50.0000 mg | ORAL_TABLET | ORAL | Status: DC | PRN
  Administered 2015-10-27 (×2): 50 mg via ORAL
  Administered 2015-10-28 – 2015-10-29 (×4): 100 mg via ORAL
  Filled 2015-10-27 (×6): qty 2

## 2015-10-27 SURGICAL SUPPLY — 58 items
AUTOTRANSFUS HAS 1/8 (MISCELLANEOUS) ×2
BATTERY INSTRU NAVIGATION (MISCELLANEOUS) ×8 IMPLANT
BLADE SAW 1 (BLADE) ×2 IMPLANT
BLADE SAW 1/2 (BLADE) ×2 IMPLANT
CANISTER SUCT 1200ML W/VALVE (MISCELLANEOUS) ×2 IMPLANT
CANISTER SUCT 3000ML (MISCELLANEOUS) ×4 IMPLANT
CAP KNEE TOTAL 3 SIGMA ×2 IMPLANT
CATH TRAY METER 16FR LF (MISCELLANEOUS) ×2 IMPLANT
CEMENT HV SMART SET (Cement) ×4 IMPLANT
COOLER POLAR GLACIER W/PUMP (MISCELLANEOUS) ×2 IMPLANT
DRAPE SHEET LG 3/4 BI-LAMINATE (DRAPES) ×2 IMPLANT
DRSG DERMACEA 8X12 NADH (GAUZE/BANDAGES/DRESSINGS) ×2 IMPLANT
DRSG OPSITE POSTOP 4X14 (GAUZE/BANDAGES/DRESSINGS) ×2 IMPLANT
DRSG TEGADERM 4X4.75 (GAUZE/BANDAGES/DRESSINGS) ×2 IMPLANT
DURAPREP 26ML APPLICATOR (WOUND CARE) ×4 IMPLANT
ELECT CAUTERY BLADE 6.4 (BLADE) ×2 IMPLANT
ELECT REM PT RETURN 9FT ADLT (ELECTROSURGICAL) ×2
ELECTRODE REM PT RTRN 9FT ADLT (ELECTROSURGICAL) ×1 IMPLANT
EX-PIN ORTHOLOCK NAV 4X150 (PIN) ×4 IMPLANT
GLOVE BIOGEL M STRL SZ7.5 (GLOVE) ×4 IMPLANT
GLOVE INDICATOR 8.0 STRL GRN (GLOVE) ×2 IMPLANT
GLOVE SURG 9.0 ORTHO LTXF (GLOVE) ×2 IMPLANT
GLOVE SURG ORTHO 9.0 STRL STRW (GLOVE) ×2 IMPLANT
GOWN STRL REUS W/ TWL LRG LVL3 (GOWN DISPOSABLE) ×2 IMPLANT
GOWN STRL REUS W/ TWL LRG LVL4 (GOWN DISPOSABLE) ×1 IMPLANT
GOWN STRL REUS W/TWL 2XL LVL3 (GOWN DISPOSABLE) ×2 IMPLANT
GOWN STRL REUS W/TWL LRG LVL3 (GOWN DISPOSABLE) ×2
GOWN STRL REUS W/TWL LRG LVL4 (GOWN DISPOSABLE) ×1
HANDPIECE SUCTION TUBG SURGILV (MISCELLANEOUS) ×2 IMPLANT
HOLDER FOLEY CATH W/STRAP (MISCELLANEOUS) ×2 IMPLANT
HOOD PEEL AWAY FLYTE STAYCOOL (MISCELLANEOUS) ×4 IMPLANT
KIT RM TURNOVER STRD PROC AR (KITS) ×2 IMPLANT
KNIFE SCULPS 14X20 (INSTRUMENTS) ×2 IMPLANT
NDL SAFETY 18GX1.5 (NEEDLE) ×2 IMPLANT
NEEDLE SPNL 20GX3.5 QUINCKE YW (NEEDLE) ×2 IMPLANT
NS IRRIG 500ML POUR BTL (IV SOLUTION) ×2 IMPLANT
PACK TOTAL KNEE (MISCELLANEOUS) ×2 IMPLANT
PAD WRAPON POLAR KNEE (MISCELLANEOUS) ×1 IMPLANT
PIN DRILL QUICK PACK ×2 IMPLANT
PIN FIXATION 1/8DIA X 3INL (PIN) ×2 IMPLANT
SOL .9 NS 3000ML IRR  AL (IV SOLUTION) ×1
SOL .9 NS 3000ML IRR UROMATIC (IV SOLUTION) ×1 IMPLANT
SOL PREP PVP 2OZ (MISCELLANEOUS) ×2
SOLUTION PREP PVP 2OZ (MISCELLANEOUS) ×1 IMPLANT
SPONGE DRAIN TRACH 4X4 STRL 2S (GAUZE/BANDAGES/DRESSINGS) ×2 IMPLANT
STAPLER SKIN PROX 35W (STAPLE) ×2 IMPLANT
SUCTION FRAZIER HANDLE 10FR (MISCELLANEOUS) ×1
SUCTION TUBE FRAZIER 10FR DISP (MISCELLANEOUS) ×1 IMPLANT
SUT VIC AB 0 CT1 36 (SUTURE) ×2 IMPLANT
SUT VIC AB 1 CT1 36 (SUTURE) ×4 IMPLANT
SUT VIC AB 2-0 CT2 27 (SUTURE) ×2 IMPLANT
SYR 20CC LL (SYRINGE) ×2 IMPLANT
SYR 30ML LL (SYRINGE) ×2 IMPLANT
SYR 50ML LL SCALE MARK (SYRINGE) ×2 IMPLANT
SYSTEM AUTOTRANSFUS DUAL TROCR (MISCELLANEOUS) ×1 IMPLANT
TOWEL OR 17X26 4PK STRL BLUE (TOWEL DISPOSABLE) ×2 IMPLANT
TOWER CARTRIDGE SMART MIX (DISPOSABLE) ×2 IMPLANT
WRAPON POLAR PAD KNEE (MISCELLANEOUS) ×2

## 2015-10-27 NOTE — Transfer of Care (Signed)
Immediate Anesthesia Transfer of Care Note  Patient: Herbert Molina  Procedure(s) Performed: Procedure(s): COMPUTER ASSISTED TOTAL KNEE ARTHROPLASTY (Right)  Patient Location: PACU  Anesthesia Type:Spinal  Level of Consciousness: awake  Airway & Oxygen Therapy: Patient Spontanous Breathing and Patient connected to face mask oxygen  Post-op Assessment: Report given to RN  Post vital signs: Reviewed  Last Vitals:  Filed Vitals:   10/27/15 0957 10/27/15 1455  BP: 132/68 117/69  Pulse: 72 69  Temp: 36.6 C 36.1 C  Resp: 16 14    Complications: No apparent anesthesia complications

## 2015-10-27 NOTE — Anesthesia Procedure Notes (Signed)
Spinal Patient location during procedure: OR Start time: 10/27/2015 11:55 AM End time: 10/27/2015 12:00 PM Staffing Anesthesiologist: Gunnar Fusi Resident/CRNA: Johnna Acosta Performed by: anesthesiologist and resident/CRNA  Preanesthetic Checklist Completed: patient identified, site marked, surgical consent, pre-op evaluation, timeout performed, IV checked, risks and benefits discussed and monitors and equipment checked Spinal Block Patient position: sitting Prep: ChloraPrep Patient monitoring: continuous pulse ox and blood pressure Approach: midline Location: L2-3 Injection technique: single-shot Needle Needle type: Whitacre  Needle gauge: 25 G Needle length: 9 cm

## 2015-10-27 NOTE — Op Note (Signed)
OPERATIVE NOTE  DATE OF SURGERY:  10/27/2015  PATIENT NAME:  Herbert Molina   DOB: 1941-02-06  MRN: [id number]  PRE-OPERATIVE DIAGNOSIS: Degenerative arthrosis of the right knee, primary  POST-OPERATIVE DIAGNOSIS:  Same  PROCEDURE:  Right total knee arthroplasty using computer-assisted navigation  SURGEON:  Marciano Sequin. M.D.  ASSISTANT:  Vance Peper, PA (present and scrubbed throughout the case, critical for assistance with exposure, retraction, instrumentation, and closure)  ANESTHESIA: spinal  ESTIMATED BLOOD LOSS: 75 mL  FLUIDS REPLACED: 1700 mL of crystalloid  TOURNIQUET TIME: 85 minutes  DRAINS: 2 medium drains to a reinfusion system  SOFT TISSUE RELEASES: Anterior cruciate ligament, posterior cruciate ligament, deep and superficial medial collateral ligament, patellofemoral ligament   IMPLANTS UTILIZED: DePuy PFC Sigma size 4 posterior stabilized femoral component (cemented), size 5 MBT tibial component (cemented), 38 mm 3 peg oval dome patella (cemented), and a 10 mm stabilized rotating platform polyethylene insert.  INDICATIONS FOR SURGERY: Herbert Molina is a 75 y.o. year old male with a long history of progressive knee pain. X-rays demonstrated severe degenerative changes in tricompartmental fashion. The patient had not seen any significant improvement despite conservative nonsurgical intervention. After discussion of the risks and benefits of surgical intervention, the patient expressed understanding of the risks benefits and agree with plans for total knee arthroplasty.   The risks, benefits, and alternatives were discussed at length including but not limited to the risks of infection, bleeding, nerve injury, stiffness, blood clots, the need for revision surgery, cardiopulmonary complications, among others, and they were willing to proceed.  PROCEDURE IN DETAIL: The patient was brought into the operating room and, after adequate spinal anesthesia was achieved, a  tourniquet was placed on the patient's upper thigh. The patient's knee and leg were cleaned and prepped with alcohol and DuraPrep and draped in the usual sterile fashion. A "timeout" was performed as per usual protocol. The lower extremity was exsanguinated using an Esmarch, and the tourniquet was inflated to 300 mmHg. An anterior longitudinal incision was made followed by a standard mid vastus approach. The deep fibers of the medial collateral ligament were elevated in a subperiosteal fashion off of the medial flare of the tibia so as to maintain a continuous soft tissue sleeve. The patella was subluxed laterally and the patellofemoral ligament was incised. Inspection of the knee demonstrated severe degenerative changes with full-thickness loss of articular cartilage. Osteophytes were debrided using a rongeur. Anterior and posterior cruciate ligaments were excised. Two 4.0 mm Schanz pins were inserted in the femur and into the tibia for attachment of the array of trackers used for computer-assisted navigation. Hip center was identified using a circumduction technique. Distal landmarks were mapped using the computer. The distal femur and proximal tibia were mapped using the computer. The distal femoral cutting guide was positioned using computer-assisted navigation so as to achieve a 5 distal valgus cut. The femur was sized and it was felt that a size 4 femoral component was appropriate. A size 4 femoral cutting guide was positioned and the anterior cut was performed and verified using the computer. This was followed by completion of the posterior and chamfer cuts. Femoral cutting guide for the central box was then positioned in the center box cut was performed.  Attention was then directed to the proximal tibia. Medial and lateral menisci were excised. The extramedullary tibial cutting guide was positioned using computer-assisted navigation so as to achieve a 0 varus-valgus alignment and 0 posterior slope. The  cut was performed  and verified using the computer. The proximal tibia was sized and it was felt that a size 5 tibial tray was appropriate. Tibial and femoral trials were inserted followed by insertion of a 10 mm polyethylene insert. The knee was felt to be somewhat tight medially. A Cobb elevator was used to elevate the superficial fibers of the medial collateral ligament. This allowed for excellent mediolateral soft tissue balancing both in flexion and in full extension. Finally, the patella was cut and prepared so as to accommodate a 38 mm 3 peg oval dome patella. A patella trial was placed and the knee was placed through a range of motion with excellent patellar tracking appreciated. The femoral trial was removed after debridement of posterior osteophytes. The central post-hole for the tibial component was reamed followed by insertion of a keel punch. Tibial trials were then removed. Cut surfaces of bone were irrigated with copious amounts of normal saline with antibiotic solution using pulsatile lavage and then suctioned dry. Polymethylmethacrylate cement was prepared in the usual fashion using a vacuum mixer. Cement was applied to the cut surface of the proximal tibia as well as along the undersurface of a size 5 MBT tibial component. Tibial component was positioned and impacted into place. Excess cement was removed using [employer] fast streamer. Cement was then applied to the cut surfaces of the femur as well as along the posterior flanges of the size 4 femoral component. The femoral component was positioned and impacted into place. Excess cement was removed using [employer] fast streamer. A 10 mm polyethylene trial was inserted and the knee was brought into full extension with steady axial compression applied. Finally, cement was applied to the backside of a 38 mm 3 peg oval dome patella and the patellar component was positioned and patellar clamp applied. Excess cement was removed using [employer] fast streamer. After adequate curing  of the cement, the tourniquet was deflated after a total tourniquet time of 85 minutes. Hemostasis was achieved using electrocautery. The knee was irrigated with copious amounts of normal saline with antibiotic solution using pulsatile lavage and then suctioned dry. 20 mL of 1.3% Exparel in 40 mL of normal saline was injected along the posterior capsule, medial and lateral gutters, and along the arthrotomy site. A 10 mm stabilized rotating platform polyethylene insert was inserted and the knee was placed through a range of motion with excellent mediolateral soft tissue balancing appreciated and excellent patellar tracking noted. 2 medium drains were placed in the wound bed and brought out through separate stab incisions to be attached to a reinfusion system. The medial parapatellar portion of the incision was reapproximated using interrupted sutures of #1 Vicryl. Subcutaneous tissue was then injected with a total of 30 cc of 0.25% Marcaine with epinephrine. Subcutaneous tissue was approximated in layers using first #0 Vicryl followed #2-0 Vicryl. The skin was approximated with skin staples. A sterile dressing was applied.  The patient tolerated the procedure well and was transported to the recovery room in stable condition.    Lorianne Malbrough P. Holley Bouche., M.D.

## 2015-10-27 NOTE — H&P (Signed)
The patient has been re-examined, and the chart reviewed, and there have been no interval changes to the documented history and physical.    The risks, benefits, and alternatives have been discussed at length. The patient expressed understanding of the risks benefits and agreed with plans for surgical intervention.  Herbert Molina P. Ladaja Yusupov, Jr. M.D.    

## 2015-10-27 NOTE — Anesthesia Preprocedure Evaluation (Signed)
Anesthesia Evaluation  Patient identified by MRN, date of birth, ID band Patient awake    Reviewed: Allergy & Precautions, NPO status , Patient's Chart, lab work & pertinent test results  History of Anesthesia Complications Negative for: history of anesthetic complications  Airway Mallampati: II       Dental   Pulmonary neg pulmonary ROS,           Cardiovascular Hypertension: pt denies.      Neuro/Psych negative neurological ROS     GI/Hepatic Neg liver ROS, hiatal hernia, GERD  Medicated and Controlled,  Endo/Other  negative endocrine ROS  Renal/GU negative Renal ROS     Musculoskeletal   Abdominal   Peds  Hematology negative hematology ROS (+) anemia ,   Anesthesia Other Findings   Reproductive/Obstetrics                             Anesthesia Physical Anesthesia Plan  ASA: II  Anesthesia Plan: Spinal   Post-op Pain Management:    Induction:   Airway Management Planned:   Additional Equipment:   Intra-op Plan:   Post-operative Plan:   Informed Consent: I have reviewed the patients History and Physical, chart, labs and discussed the procedure including the risks, benefits and alternatives for the proposed anesthesia with the patient or authorized representative who has indicated his/her understanding and acceptance.     Plan Discussed with:   Anesthesia Plan Comments:         Anesthesia Quick Evaluation

## 2015-10-27 NOTE — Brief Op Note (Signed)
10/27/2015  3:00 PM  PATIENT:  Herbert Molina  75 y.o. male  PRE-OPERATIVE DIAGNOSIS:  degenerative osteoarthritis of the right knee  POST-OPERATIVE DIAGNOSIS:  Same  PROCEDURE:  Procedure(s): COMPUTER ASSISTED TOTAL KNEE ARTHROPLASTY (Right)  SURGEON:  Surgeon(s) and Role:    * Dereck Leep, MD - Primary   ASSISTANTS: Vance Peper, PA   ANESTHESIA:   spinal  EBL:  Total I/O In: 1700 [I.V.:1700] Out: 175 [Urine:100; Blood:75]  BLOOD ADMINISTERED:none  DRAINS: 2 medium drains to a reinfusion system   LOCAL MEDICATIONS USED:  MARCAINE    and OTHER Exparel  SPECIMEN:  No Specimen  DISPOSITION OF SPECIMEN:  N/A  COUNTS:  YES  TOURNIQUET:   85 minutes  DICTATION: .Dragon Dictation  PLAN OF CARE: Admit to inpatient   PATIENT DISPOSITION:  PACU - hemodynamically stable.   Delay start of Pharmacological VTE agent (>24hrs) due to surgical blood loss or risk of bleeding: yes

## 2015-10-27 NOTE — Progress Notes (Addendum)
Pt tolerated sitting on side of bed and bedside commode

## 2015-10-28 ENCOUNTER — Encounter: Payer: Self-pay | Admitting: Orthopedic Surgery

## 2015-10-28 LAB — BASIC METABOLIC PANEL
Anion gap: 6 (ref 5–15)
BUN: 13 mg/dL (ref 6–20)
CHLORIDE: 107 mmol/L (ref 101–111)
CO2: 24 mmol/L (ref 22–32)
CREATININE: 0.82 mg/dL (ref 0.61–1.24)
Calcium: 8.1 mg/dL — ABNORMAL LOW (ref 8.9–10.3)
GFR calc Af Amer: >60 mL/min (ref >60)
GFR calc non Af Amer: >60 mL/min (ref >60)
Glucose, Bld: 138 mg/dL — ABNORMAL HIGH (ref 65–99)
Potassium: 3.6 mmol/L (ref 3.5–5.1)
SODIUM: 137 mmol/L (ref 135–145)

## 2015-10-28 LAB — CBC
HCT: 38.1 % — ABNORMAL LOW (ref 40.0–52.0)
HEMOGLOBIN: 12.6 g/dL — AB (ref 13.0–18.0)
MCH: 30.7 pg (ref 26.0–34.0)
MCHC: 33 g/dL (ref 32.0–36.0)
MCV: 93 fL (ref 80.0–100.0)
PLATELETS: 168 10*3/uL (ref 150–440)
RBC: 4.09 MIL/uL — ABNORMAL LOW (ref 4.40–5.90)
RDW: 13.2 % (ref 11.5–14.5)
WBC: 9.6 10*3/uL (ref 3.8–10.6)

## 2015-10-28 MED ORDER — TRAMADOL HCL 50 MG PO TABS: 50.0000 mg | ORAL_TABLET | ORAL | Status: AC | PRN

## 2015-10-28 MED ORDER — ENOXAPARIN SODIUM 40 MG/0.4ML ~~LOC~~ SOLN: 40.0000 mg | SUBCUTANEOUS | Status: AC

## 2015-10-28 MED ORDER — OXYCODONE HCL 5 MG PO TABS: 5.0000 mg | ORAL_TABLET | ORAL | Status: AC | PRN

## 2015-10-28 NOTE — Evaluation (Signed)
Physical Therapy Evaluation Patient Details Name: Herbert Molina MRN: [id number] DOB: [birth year] Today's Date: 10/28/2015   History of Present Illness  Pt is a 75 y.o. male s/p R TKA secondary to degenerative OA 10/27/15.  PMH includes R meniscus repair, hiatal hernia, anemia, vertigo.  Clinical Impression  Prior to admission, pt was modified independent using rollator within home and [facility] in community.  Pt lives with his wife in 1 level home with 4 steps to enter with R railing.  Currently pt is min assist supine to sit; mod assist to stand with RW; and CGA ambulating 40 feet with RW.  Pt able to perform x10 R LE SLR independently and does not require KI at this time.  Pt would benefit from skilled PT to address noted impairments and functional limitations.  Recommend pt discharge to home with HHPT and support of family when medically appropriate.     Follow Up Recommendations Home health PT (assist for stairs)    Equipment Recommendations  Rolling walker with 5" wheels;3in1 (PT)    Recommendations for Other Services       Precautions / Restrictions Precautions Precautions: Fall Restrictions Weight Bearing Restrictions: Yes RLE Weight Bearing: Weight bearing as tolerated      Mobility  Bed Mobility Overal bed mobility: Needs Assistance Bed Mobility: Supine to Sit     Supine to sit: Min assist;HOB elevated     General bed mobility comments: assist for R LE upon coming off edge of bed  Transfers Overall transfer level: Needs assistance Equipment used: Rolling walker (2 wheeled) Transfers: Sit to/from Stand Sit to Stand: Mod assist         General transfer comment: assist to initiate stand and come to full upright posture; vc's required for hand and feet placement  Ambulation/Gait Ambulation/Gait assistance: Min guard Ambulation Distance (Feet): 40 Feet Assistive device: Rolling walker (2 wheeled)   Gait velocity: decreased   General Gait Details: step to;  decreased WB'ing R LE; antalgic; vc's required for stepping technique and use of walker; pt reporting minimal pain with ambulation  Stairs            Wheelchair Mobility    Modified Rankin (Stroke Patients Only)       Balance Overall balance assessment: Needs assistance Sitting-balance support: Feet supported;Bilateral upper extremity supported Sitting balance-Leahy Scale: Fair     Standing balance support: Bilateral upper extremity supported (on RW) Standing balance-Leahy Scale: Fair                               Pertinent Vitals/Pain Pain Assessment: 0-10 Pain Score: 3  (0/10 at rest; minimal pain with activity) Pain Location: R knee Pain Descriptors / Indicators: Operative site guarding;Tender Pain Intervention(s): Limited activity within patient's tolerance;Monitored during session;Premedicated before session;Repositioned;Ice applied  See flow sheet for vitals.    Home Living Family/patient expects to be discharged to:: Private residence Living Arrangements: Spouse/significant other Available Help at Discharge: Family Type of Home: House Home Access: Stairs to enter Entrance Stairs-Rails: Right Entrance Stairs-Number of Steps: 4 with rail Home Layout: One level [place]: Environmental consultant - 4 wheels (borrowed [facility])      Prior Function Level of Independence: Independent with assistive device(s)         Comments: Pt independent with rollator in home and used SPC in community; pt denies any falls in past 6 months.     Hand Dominance  Extremity/Trunk Assessment   Upper Extremity Assessment: Defer to OT evaluation           Lower Extremity Assessment: RLE deficits/detail;LLE deficits/detail RLE Deficits / Details: able to perform x10 R LE SLR independently; R hip flexion at least 3+/5; R knee extension at least 3/5; R DF at least 4/5 LLE Deficits / Details: strength WFL; L knee 0-120 degrees  Cervical / Trunk Assessment: Normal   Communication   Communication: No difficulties  Cognition Arousal/Alertness: Awake/alert Behavior During Therapy: WFL for tasks assessed/performed Overall Cognitive Status: Within Functional Limits for tasks assessed                      General Comments General comments (skin integrity, edema, etc.): hemovac drain and dressing intact  Nursing cleared pt for participation in physical therapy.  Pt agreeable to PT session.    Exercises Total Joint Exercises Goniometric ROM: R knee extension semi-supine 9 degrees short of neutral; R knee flexion 76 degrees in sitting  Performed semi-supine B LE therapeutic exercise x 10 reps:  Ankle pumps (AROM B LE's); quad sets x3 second holds (AROM B LE's); SAQ's (AROM R; AROM L); heelslides (AAROM R; AROM L), hip abd/adduction (AROM R; AROM L), and SLR (AROM R; AROM L).  Pt required vc's and tactile cues for correct technique with exercises.       Assessment/Plan    PT Assessment Patient needs continued PT services  PT Diagnosis Difficulty walking;Acute pain   PT Problem List Decreased strength;Decreased range of motion;Decreased activity tolerance;Decreased balance;Decreased mobility;Decreased knowledge of use of DME;Decreased knowledge of precautions;Pain  PT Treatment Interventions DME instruction;Gait training;Stair training;Functional mobility training;Therapeutic activities;Therapeutic exercise;Balance training;Patient/family education;Manual techniques   PT Goals (Current goals can be found in the Care Plan section) Acute Rehab PT Goals Patient Stated Goal: to go home PT Goal Formulation: With patient Time For Goal Achievement: 11/11/15 Potential to Achieve Goals: Good    Frequency BID   Barriers to discharge        Co-evaluation               End of Session Equipment Utilized During Treatment: Gait belt Activity Tolerance: Patient tolerated treatment well Patient left: in chair;with call bell/phone within reach;with  chair alarm set;with family/visitor present;with SCD's reapplied (B heels elevated via towel rolls; polar care in place) Nurse Communication: Mobility status         Time: [id number] PT Time Calculation (min) (ACUTE ONLY): 47 min   Charges:   PT Evaluation $PT Eval Low Complexity: 1 Procedure PT Treatments $Therapeutic Exercise: 8-22 mins   PT G CodesLeitha Bleak 11-10-2015, 12:14 PM Leitha Bleak, [place]

## 2015-10-28 NOTE — Discharge Summary (Signed)
Physician Discharge Summary  Patient ID: Herbert Molina MRN: [id number] DOB/AGE: 12-04-1940 75 y.o.  Admit date: 10/27/2015 Discharge date: 10/29/2015  Admission Diagnoses:  degenerative osteoarthritis   Discharge Diagnoses: Patient Active Problem List   Diagnosis Date Noted  . Right knee DJD 10/27/2015  . 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 Anemia   . Arthritis   . Hypertension   . BPH (benign prostatic hypertrophy)   . History of hiatal hernia   . GERD (gastroesophageal reflux disease)   . Vertigo      Transfusion: Autovac transfusions were given first 6 hours postoperatively.   Consultants (if any):   case management for home health assistance  Discharged Condition: Improved  Hospital Course: [name] is an 75 y.o. male who was admitted 10/27/2015 with a diagnosis of degenerative arthrosis right knee and went to the operating room on 10/27/2015 and underwent the above named procedures.    Surgeries:Procedure(s): COMPUTER ASSISTED TOTAL KNEE ARTHROPLASTY on 10/27/2015  PRE-OPERATIVE DIAGNOSIS: Degenerative arthrosis of the right knee, primary  POST-OPERATIVE DIAGNOSIS: Same  PROCEDURE: Right total knee arthroplasty using computer-assisted navigation  SURGEON: Marciano Sequin. M.D.  ASSISTANT: Vance Peper, PA (present and scrubbed throughout the case, critical for assistance with exposure, retraction, instrumentation, and closure)  ANESTHESIA: spinal  ESTIMATED BLOOD LOSS: 75 mL  FLUIDS REPLACED: 1700 mL of crystalloid  TOURNIQUET TIME: 85 minutes  DRAINS: 2 medium drains to a reinfusion system  SOFT TISSUE RELEASES: Anterior cruciate ligament, posterior cruciate ligament, deep and superficial medial collateral ligament, patellofemoral ligament   IMPLANTS UTILIZED: DePuy PFC Sigma size 4 posterior stabilized femoral component (cemented), size 5 MBT tibial component (cemented), 38 mm 3 peg oval dome  patella (cemented), and a 10 mm stabilized rotating platform polyethylene insert.  INDICATIONS FOR SURGERY: Herbert Molina is a 75 y.o. year old male with a long history of progressive knee pain. X-rays demonstrated severe degenerative changes in tricompartmental fashion. The patient had not seen any significant improvement despite conservative nonsurgical intervention. After discussion of the risks and benefits of surgical intervention, the patient expressed understanding of the risks benefits and agree with plans for total knee arthroplasty.   The risks, benefits, and alternatives were discussed at length including but not limited to the risks of infection, bleeding, nerve injury, stiffness, blood clots, the need for revision surgery, cardiopulmonary complications, among others, and they were willing to proceed. Patient tolerated the surgery well. No complications .Patient was taken to PACU where she was stabilized and then transferred to the orthopedic floor.  Patient started on Lovenox 30 q 12 hrs. Foot pumps applied bilaterally at 80 mm hg. Heels elevated off bed with rolled towels. No evidence of DVT. Calves non tender. Negative Homan. Physical therapy started on day #1 for gait training and transfer with OT starting on  day #1 for ADL and assisted devices. Patient has done well with therapy. Ambulated greater than 200 feet upon being discharged. Was able to go up 4 steps independently and safely without any complications  Patient's IV and Foley were discontinued on day #1 and hemovac was d/c on day #2 along with dressing change   He was given perioperative antibiotics:  Anti-infectives    Start     Dose/Rate Route Frequency Ordered Stop   10/27/15 1700  ceFAZolin (ANCEF) IVPB 2 g/50 mL premix     2 g 100 mL/hr over 30 Minutes Intravenous Every 6 hours 10/27/15  1615 10/28/15 1659   10/27/15 1615  valACYclovir (VALTREX) tablet 500 mg     500 mg Oral Daily PRN 10/27/15 1615     10/27/15 1012   ceFAZolin (ANCEF) 2-3 GM-% IVPB SOLR    Comments:  Rexanne Mano: cabinet override      10/27/15 1012 10/27/15 2214   10/27/15 0200  ceFAZolin (ANCEF) IVPB 2 g/50 mL premix     2 g 100 mL/hr over 30 Minutes Intravenous  Once 10/27/15 0150 10/27/15 1220    .  He was fitted with AV 1 compression foot pump devices bilaterally, early ambulation, instructed on heel pumps and TED stockings bilaterally for DVT prophylaxis.  He benefited maximally from the hospital stay and there were no complications.    Recent vital signs:  Filed Vitals:   10/28/15 0358 10/28/15 0727  BP: 110/59 110/54  Pulse: 67 61  Temp: 98.6 F (37 C) 97.4 F (36.3 C)  Resp: 18 20    Recent laboratory studies:  Lab Results  Component Value Date   HGB 12.6* 10/28/2015   HGB 14.7 10/01/2015   HGB 14.3 02/13/2015   Lab Results  Component Value Date   WBC 9.6 10/28/2015   PLT 168 10/28/2015   Lab Results  Component Value Date   INR 0.99 10/01/2015   Lab Results  Component Value Date   NA 137 10/28/2015   K 3.6 10/28/2015   CL 107 10/28/2015   CO2 24 10/28/2015   BUN 13 10/28/2015   CREATININE 0.82 10/28/2015   GLUCOSE 138* 10/28/2015    Discharge Medications:     Medication List    STOP taking these medications        naproxen sodium 220 MG tablet  Commonly known as:  ANAPROX      TAKE these medications        acyclovir ointment 5 %  Commonly known as:  ZOVIRAX  Apply 1 application topically daily as needed.     aspirin EC 81 MG tablet  Take 81 mg by mouth daily.     atorvastatin 10 MG tablet  Commonly known as:  LIPITOR  Take 10 mg by mouth at bedtime.     cholecalciferol 1000 units tablet  Commonly known as:  VITAMIN D  Take 2,000 Units by mouth daily.     co-enzyme Q-10 30 MG capsule  Take 100 mg by mouth daily.     [employer] LEG CRAMPS PAIN RELIEF PO  Take 1 tablet by mouth daily as needed.     docusate sodium 100 MG capsule  Commonly known as:  COLACE  Take 100 mg by  mouth at bedtime.     donepezil 5 MG tablet  Commonly known as:  ARICEPT  Take 5 mg by mouth at bedtime.     enoxaparin 40 MG/0.4ML injection  Commonly known as:  LOVENOX  Inject 0.4 mLs (40 mg total) into the skin daily.     Garlic 123XX123 MG Caps  Take 1,000 mg by mouth 2 (two) times daily.     HYDROcodone-acetaminophen 5-325 MG tablet  Commonly known as:  NORCO/VICODIN  Take 1 tablet by mouth every 6 (six) hours as needed for moderate pain.     ketoconazole 2 % cream  Commonly known as:  NIZORAL  Apply 1 application topically daily as needed for irritation.     meclizine 25 MG tablet  Commonly known as:  ANTIVERT  Take 25 mg by mouth 3 (three) times daily as needed for  dizziness.     OMEGA 3 500 PO  Take 1 tablet by mouth at bedtime.     omeprazole 20 MG capsule  Commonly known as:  PRILOSEC  Take 20 mg by mouth 2 (two) times daily before a meal.     ONE-A-DAY 50 PLUS PO  Take 1 capsule by mouth daily.     oxyCODONE 5 MG immediate release tablet  Commonly known as:  Oxy IR/ROXICODONE  Take 1-2 tablets (5-10 mg total) by mouth every 4 (four) hours as needed for severe pain or breakthrough pain.     Potassium Gluconate 595 MG Caps  Take 1 capsule by mouth daily.     sucralfate 1 g tablet  Commonly known as:  CARAFATE  Take 1 g by mouth daily.     traMADol 50 MG tablet  Commonly known as:  ULTRAM  Take 1-2 tablets (50-100 mg total) by mouth every 4 (four) hours as needed for moderate pain.     valACYclovir 500 MG tablet  Commonly known as:  VALTREX  Take 500 mg by mouth daily as needed.     vitamin E 400 UNIT capsule  Take 400 Units by mouth daily.        Diagnostic Studies: Dg Knee Right Port  10/27/2015  CLINICAL DATA:  Right knee replacement EXAM: PORTABLE RIGHT KNEE - 1-2 VIEW COMPARISON:  None. FINDINGS: The right knee demonstrates a total knee arthroplasty without evidence of hardware failure complication. There is no significant joint effusion. There  is no fracture or dislocation. The alignment is anatomic. Surgical drains are present. Post-surgical changes noted in the surrounding soft tissues. IMPRESSION: Right total knee arthroplasty. Electronically Signed   By: Kathreen Devoid   On: 10/27/2015 16:30    Disposition: 01-Home or Self Care      Discharge Instructions    Diet - low sodium heart healthy    Complete by:  As directed      Increase activity slowly    Complete by:  As directed            Follow-up Information    Follow up with [facility] R[facility] PA On 11/11/2015.   Specialty:  Physician Assistant   Why:  at 8:45am   Contact information:   [address] [facility] [place] [address] [phone]       Follow up with Dereck Leep, MD On 12/09/2015.   Specialty:  Orthopedic Surgery   Why:  at 9:45am   Contact information:   [place] [address] [phone]        Signed: Watt Climes 10/28/2015, 7:38 AM

## 2015-10-28 NOTE — Discharge Instructions (Signed)

## 2015-10-28 NOTE — Progress Notes (Signed)
Clinical Social Worker (CSW) received SNF consult. PT is recommending home health. RN Case Manager is aware of above. Please reconsult if future social work needs arise. CSW signing off.   Drakkar Medeiros Morgan, LCSW [phone] 

## 2015-10-28 NOTE — Care Management Note (Signed)
Case Management Note  Patient Details  Name: Herbert Molina MRN: [id number] Date of Birth: [birth year]  Subjective/Objective:                  Met with patient and his wife to discuss discharge planning. He has a rollator at home but requests a bedside commode and rolling walker. He plans to return home with his wife. He uses [facility] for Rx [phone]. He would like to use [facility] for HHPT.   Action/Plan: List of home health agencies left with patient. Referral called to [facility] with [place]. I have requested rolling walker and bedside commode from [facility] RN[facility] will continue to follow.   Expected Discharge Date:                  Expected Discharge Plan:     In-House Referral:     Discharge planning Services  CM Consult  Post Acute Care Choice:  Durable Medical Equipment, Home Health Choice offered to:  Patient, Spouse  DME Arranged:  3-N-1, Walker rolling DME Agency[facility]  [facility]     Status of Service:  In process, will continue to follow  Medicare Important Message Given:    Date Medicare IM Given:    Medicare IM give by:    Date Additional Medicare IM Given:    Additional Medicare Important Message give by:     If discussed at LBisonof Stay Meetings, dates discussed:    Additional Comments:  AMarshell Garfinkel RN 10/28/2015, 8:10 AM

## 2015-10-28 NOTE — Anesthesia Postprocedure Evaluation (Signed)
Anesthesia Post Note  Patient: Herbert Molina  Procedure(s) Performed: Procedure(s) (LRB): COMPUTER ASSISTED TOTAL KNEE ARTHROPLASTY (Right)  Patient location during evaluation: Nursing Unit Anesthesia Type: Spinal Level of consciousness: awake and alert and oriented Pain management: pain level controlled Vital Signs Assessment: post-procedure vital signs reviewed and stable Respiratory status: spontaneous breathing and nonlabored ventilation Cardiovascular status: blood pressure returned to baseline and stable Postop Assessment: no headache and patient able to bend at knees Anesthetic complications: no    Last Vitals:  Filed Vitals:   10/28/15 0358 10/28/15 0727  BP: 110/59 110/54  Pulse: 67 61  Temp: 37 C 36.3 C  Resp: 18 20    Last Pain:  Filed Vitals:   10/28/15 0727  PainSc: 2                  Johnna Acosta

## 2015-10-28 NOTE — Progress Notes (Signed)
Physical Therapy Treatment Patient Details Name: Herbert Molina MRN: [id number] DOB: 12/23/1940 Today's Date: 10/28/2015    History of Present Illness Pt is a 75 y.o. male s/p R TKA secondary to degenerative OA 10/27/15.  PMH includes R meniscus repair, hiatal hernia, anemia, vertigo.    PT Comments    Pt was agreeable within session and ambulated 130 feet with rolling walker with contact guard assist.  Pt needed verbal cueing for hand and LE placement and motor planning during gait and transfers. Pt had no complaints of pain during rest; but did report right knee pain at a 5/10 during activity. In the next session plan to focus on increasing ambulation distance, trialing stairs, and increasing right knee ROM.   Follow Up Recommendations  Home health PT (assist for stairs)     Equipment Recommendations  Rolling walker with 5" wheels;3in1 (PT)    Recommendations for Other Services       Precautions / Restrictions Precautions Precautions: Fall Restrictions Weight Bearing Restrictions: Yes RLE Weight Bearing: Weight bearing as tolerated    Mobility  Bed Mobility Overal bed mobility: Needs Assistance Bed Mobility: Sit to Supine     Sit to supine: Min assist;HOB elevated   General bed mobility comments: PT assisted R LE from sit to supine   Transfers Overall transfer level: Needs assistance Equipment used: Rolling walker (2 wheeled) Transfers: Sit to/from [facility] Sit to Stand: Min assist Stand pivot transfers: Min assist       General transfer comment: PT assisted in initiation of sit to stand transfer and stand step turn recliner to bed transfer; verbal cues required for hand and feet placement  Ambulation/Gait Ambulation/Gait assistance: Min guard Ambulation Distance (Feet): 130 Feet Assistive device: Rolling walker (2 wheeled) Gait Pattern/deviations: Step-to pattern;Decreased step length - right;Decreased stance time - right;Antalgic Gait  velocity: decreased   General Gait Details: decreased knee flexion with swing phase; Pt required verbal cues for walker distance during gait, increasing knee flexion during swing phase and heel contact during intial contact gait phase   Stairs            Wheelchair Mobility    Modified Rankin (Stroke Patients Only)       Balance Overall balance assessment: Needs assistance Sitting-balance support: Feet supported;Bilateral upper extremity supported Sitting balance-Leahy Scale: Fair     Standing balance support: Bilateral upper extremity supported (on rolling walker) Standing balance-Leahy Scale: Fair                      Cognition Arousal/Alertness: Awake/alert Behavior During Therapy: WFL for tasks assessed/performed Overall Cognitive Status: Within Functional Limits for tasks assessed                      Exercises Total Joint Exercises Ankle Circles/Pumps: AROM;Both;10 reps Quad Sets: AROM;Right;10 reps Long Arc Quad: AROM;Right;10 reps Goniometric ROM: R knee extension semi-supine 9 degrees short of neutral; R knee flexion 76 degrees in sitting (performed in AM) Other Exercises Other Exercises: self sitting AAROM right knee flexion with contralateral LE assisting 1x5 with 15 second holds    General Comments General comments (skin integrity, edema, etc.): hemovac drain and dressing intact      Pertinent Vitals/Pain Pain Assessment: 0-10 Pain Score: 5  (during ambulation) Pain Location: R knee Pain Descriptors / Indicators: Operative site guarding Pain Intervention(s): Limited activity within patient's tolerance;Monitored during session;Repositioned;Ice applied  See flow sheet for vitals.    Home Living  Family/patient expects to be discharged to:: Private residence Living Arrangements: Spouse/significant other Available Help at Discharge: Family Type of Home: House Home Access: Stairs to enter Entrance Stairs-Rails: Right Home Layout:  One level Home Equipment: Environmental consultant - 4 wheels (borrowed [facility])      Prior Function Level of Independence: Independent with assistive device(s)      Comments: Pt independent with rollator in home and used SPC in community; pt denies any falls in past 6 months.   PT Goals (current goals can now be found in the care plan section) Acute Rehab PT Goals Patient Stated Goal: to go home  PT Goal Formulation: With patient Time For Goal Achievement: 11/11/15 Potential to Achieve Goals: Good Additional Goals Additional Goal #1: Pt's R knee ROM 0-90 degrees. Progress towards PT goals: Progressing toward goals    Frequency  BID    PT Plan      Co-evaluation             End of Session Equipment Utilized During Treatment: Gait belt Activity Tolerance: Patient tolerated treatment well Patient left: in bed;with call bell/phone within reach;with bed alarm set;with SCD's reapplied (bilateral heels elevated by towel rolls, polar care in place and activated)     Time: [id number] PT Time Calculation (min) (ACUTE ONLY): 38 min  Charges:  $Gait Training: 8-22 mins $Therapeutic Exercise: 8-22 mins $Therapeutic Activity: 8-22 mins                    G Codes:      Herbert Molina SPT Idan Prime 10/28/2015, 2:42 PM Herbert Molina, [place]

## 2015-10-28 NOTE — Evaluation (Signed)
Occupational Therapy Evaluation Patient Details Name: Herbert Molina MRN: [id number] DOB: 11-Jul-1941 Today's Date: 10/28/2015    History of Present Illness This patient is a 75 year old male who came to [facility] for a R TKR.   Clinical Impression   This patient is a 75 year old male who came to [facility] for a R total knee replacement.  Patient lives in a one story home with 4 steps to enter with a rail.  He had been independent with ADL and functional mobility. He now has deficits in mobility, pain, and activities of daily living. He now requires assistance and would benefit from ADL/functional mobility training.      Follow Up Recommendations       Equipment Recommendations       Recommendations for Other Services       Precautions / Restrictions Restrictions Weight Bearing Restrictions: Yes RLE Weight Bearing: Weight bearing as tolerated      Mobility Bed Mobility                  Transfers                      Balance                                            ADL                                         General ADL Comments: Patient had been independent . He now needs assist. Today, practiced lower body dressing using hip kit. Patient Donned/doffed socks and pants to knees (drain still in place). Patient needed set up minimal assist and verbal cues for technique and safety.      Vision     Perception     Praxis      Pertinent Vitals/Pain Pain Assessment:  (Patient reported no pain at rest.) Increased pain with movement.      Hand Dominance     Extremity/Trunk Assessment Upper Extremity Assessment Upper Extremity Assessment: Overall WFL for tasks assessed   Lower Extremity Assessment Lower Extremity Assessment: Defer to PT evaluation       Communication Communication Communication: No difficulties   Cognition Arousal/Alertness: Awake/alert Behavior During Therapy: WFL for  tasks assessed/performed Overall Cognitive Status: Within Functional Limits for tasks assessed.                     General Comments       Exercises       Shoulder Instructions      Home Living Family/patient expects to be discharged to:: Private residence Living Arrangements: Spouse/significant other   Type of Home: House Home Access: Stairs to enter [employer] of Steps: 4 with rail                              Prior Functioning/Environment Level of Independence: Independent             OT Diagnosis: Acute pain   OT Problem List: Decreased range of motion;Decreased activity tolerance;Impaired balance (sitting and/or standing);Decreased knowledge of use of DME or AE   OT Treatment/Interventions: Self-care/ADL training    OT  Goals(Current goals can be found in the care plan section) Acute Rehab OT Goals Patient Stated Goal: to go home OT Goal Formulation: With patient/family Time For Goal Achievement: 11/11/15 Potential to Achieve Goals: Good  OT Frequency: Min 1X/week   Barriers to D/C:            Co-evaluation              End of Session Equipment Utilized During Treatment:  (hip kit)  Activity Tolerance:   Patient left: in chair;with call bell/phone within reach;with chair alarm set;with family/visitor present   Time: 0051-1021 OT Time Calculation (min): 24 min Charges:  OT General Charges $OT Visit: 1 Procedure OT Evaluation $OT Eval Low Complexity: 1 Procedure OT Treatments $Self Care/Home Management : 8-22 mins G-Codes:    Myrene Galas, MS/OTR/L  10/28/2015, 11:25 AM

## 2015-10-28 NOTE — Progress Notes (Signed)
   Subjective: 1 Day Post-Op Procedure(s) (LRB): COMPUTER ASSISTED TOTAL KNEE ARTHROPLASTY (Right) Patient reports pain as 3 on 0-10 scale.   Patient is well, and has had no acute complaints or problems We will start therapy today.  Plan is to go Home after hospital stay. no nausea and no vomiting Patient denies any chest pains or shortness of breath. Objective: Vital signs in last 24 hours: Temp:  [96.7 F (35.9 C)-98.6 F (37 C)] 97.4 F (36.3 C) (01/24 0727) Pulse Rate:  [58-72] 61 (01/24 0727) Resp:  [12-21] 20 (01/24 0727) BP: (95-132)/(44-69) 110/54 mmHg (01/24 0727) SpO2:  [93 %-100 %] 100 % (01/24 0727) Weight:  [96.616 kg (213 lb)] 96.616 kg (213 lb) (01/23 1624) Unable to evaluate at the current time 2/2 original dressing being on Heels are non tender and elevated off the bed using rolled towels Intake/Output from previous day: 01/23 0701 - 01/24 0700 In: 3591.7 [P.O.:480; I.V.:2911.7] Out: 2390 [Urine:2055; Drains:260; Blood:75] Intake/Output this shift:     Recent Labs  10/28/15 0555  HGB 12.6*    Recent Labs  10/28/15 0555  WBC 9.6  RBC 4.09*  HCT 38.1*  PLT 168    Recent Labs  10/27/15 1712 10/28/15 0555  NA  --  137  K  --  3.6  CL  --  107  CO2  --  24  BUN  --  13  CREATININE 0.80 0.82  GLUCOSE  --  138*  CALCIUM  --  8.1*   No results for input(s): LABPT, INR in the last 72 hours.  EXAM General - Patient is Alert, Appropriate and Oriented Extremity - Neurologically intact Neurovascular intact Sensation intact distally Intact pulses distally Dorsiflexion/Plantar flexion intact Dressing - dressing C/D/I Motor Function - intact, moving foot and toes well on exam.    Past Medical History  Diagnosis Date  . Anemia   . Arthritis   . Hypertension   . BPH (benign prostatic hypertrophy)   . History of hiatal hernia   . GERD (gastroesophageal reflux disease)   . Vertigo     Assessment/Plan: 1 Day Post-Op Procedure(s)  (LRB): COMPUTER ASSISTED TOTAL KNEE ARTHROPLASTY (Right) Active Problems:   Right knee DJD   Total knee replacement status  Estimated body mass index is 31.44 kg/(m^2) as calculated from the following:   Height as of this encounter: 5\' 9"  (1.753 m).   Weight as of this encounter: 96.616 kg (213 lb). Advance diet Up with therapy D/C IV fluids Plan for discharge tomorrow Discharge home with home health  Labs: reviewed DVT Prophylaxis - Lovenox, Foot Pumps and TED hose Weight-Bearing as tolerated to right leg D/C O2 and Pulse OX and try on Room Air Need to start working on a bowel movement Needs to walk at least 1/2 around the nurses station today Labs in am  [employer]. [place] 10/28/2015, 7:28 AM

## 2015-10-28 NOTE — Progress Notes (Signed)
Patient's up and ambulating in hallway with PT.  

## 2015-10-29 LAB — CBC
HEMATOCRIT: 36.5 % — AB (ref 40.0–52.0)
HEMOGLOBIN: 12.4 g/dL — AB (ref 13.0–18.0)
MCH: 32.1 pg (ref 26.0–34.0)
MCHC: 34 g/dL (ref 32.0–36.0)
MCV: 94.3 fL (ref 80.0–100.0)
Platelets: 183 10*3/uL (ref 150–440)
RBC: 3.87 MIL/uL — ABNORMAL LOW (ref 4.40–5.90)
RDW: 13.4 % (ref 11.5–14.5)
WBC: 7.9 10*3/uL (ref 3.8–10.6)

## 2015-10-29 MED ORDER — LACTULOSE 10 GM/15ML PO SOLN: 10.0000 g | Freq: Two times a day (BID) | ORAL | Status: DC | PRN

## 2015-10-29 NOTE — Care Management Important Message (Signed)
Important Message  Patient Details  Name: Herbert Molina MRN: [id number] Date of Birth: October 28, 1940   Medicare Important Message Given:  Yes    Juliann Pulse A Karis Emig 10/29/2015, 10:10 AM

## 2015-10-29 NOTE — Progress Notes (Signed)
Cameron Proud Notified of blood pressure

## 2015-10-29 NOTE — Care Management (Signed)
Spoke with Mr. Student at the bedside. Received bedside commode and rolling walker from [facility]. [place] services will be provided by [place]. Merri Ray, representative for [facility] updated. Wife is picking up Lovenox from [facility]. Wife will transport home. Shelbie Ammons RN MSN CCM Care Management [phone]

## 2015-10-29 NOTE — Progress Notes (Signed)
   Subjective: 2 Days Post-Op Procedure(s) (LRB): COMPUTER ASSISTED TOTAL KNEE ARTHROPLASTY (Right) Patient reports pain as 5 on 0-10 scale.   Patient is well, and has had no acute complaints or problems Continue with physical  therapy today.  Plan is to go Home after hospital stay. no nausea and no vomiting Patient denies any chest pains or shortness of breath. Objective: Vital signs in last 24 hours: Temp:  [97.4 F (36.3 C)-98.1 F (36.7 C)] 98.1 F (36.7 C) (01/25 0337) Pulse Rate:  [61-92] 67 (01/25 0337) Resp:  [16-20] 18 (01/25 0337) BP: (110-128)/(53-64) 119/61 mmHg (01/25 0337) SpO2:  [94 %-100 %] 97 % (01/25 0337) well approximated incision Heels are non tender and elevated off the bed using rolled towels Intake/Output from previous day: 01/24 0701 - 01/25 0700 In: 2200 [P.O.:720; I.V.:1480] Out: 2252 [Urine:1950; Drains:302] Intake/Output this shift:     Recent Labs  10/28/15 0555 10/29/15 0609  HGB 12.6* 12.4*    Recent Labs  10/28/15 0555 10/29/15 0609  WBC 9.6 7.9  RBC 4.09* 3.87*  HCT 38.1* 36.5*  PLT 168 183    Recent Labs  10/27/15 1712 10/28/15 0555  NA  --  137  K  --  3.6  CL  --  107  CO2  --  24  BUN  --  13  CREATININE 0.80 0.82  GLUCOSE  --  138*  CALCIUM  --  8.1*   No results for input(s): LABPT, INR in the last 72 hours.  EXAM General - Patient is Alert, Appropriate and Oriented Extremity - Neurologically intact Neurovascular intact Sensation intact distally Intact pulses distally Dorsiflexion/Plantar flexion intact Dressing - dressing C/D/I Motor Function - intact, moving foot and toes well on exam.    Past Medical History  Diagnosis Date  . Anemia   . Arthritis   . Hypertension   . BPH (benign prostatic hypertrophy)   . History of hiatal hernia   . GERD (gastroesophageal reflux disease)   . Vertigo     Assessment/Plan: 2 Days Post-Op Procedure(s) (LRB): COMPUTER ASSISTED TOTAL KNEE ARTHROPLASTY  (Right) Active Problems:   Right knee DJD   Total knee replacement status  Estimated body mass index is 31.44 kg/(m^2) as calculated from the following:   Height as of this encounter: 5\' 9"  (1.753 m).   Weight as of this encounter: 96.616 kg (213 lb). Up with therapy Discharge home with home health today provided pt does the lap and steps as well as having a bowel movement  Labs: reviewed DVT Prophylaxis - Lovenox, Foot Pumps and TED hose Weight-Bearing as tolerated to right leg hemovac d/c'd today Please change dressing prior to d/c and give the pt 2 extra honeycomb dressing to take home.  Jillyn Ledger. Door Siren 10/29/2015, 7:07 AM

## 2015-10-29 NOTE — Progress Notes (Signed)
Physical Therapy Treatment Patient Details Name: Herbert Molina MRN: [id number] DOB: May 23, 1941 Today's Date: 10/29/2015    History of Present Illness Pt is a 75 y.o. male s/p R TKA secondary to degenerative OA 10/27/15.  PMH includes R meniscus repair, hiatal hernia, anemia, vertigo.    PT Comments    Pt able to ambulate around nursing loop with RW and navigate stairs with railing (pt's R knee initially buckling on first couple steps requiring assist but no further knee buckling noted after that).  Pt reporting feeling R knee "clicking"/"popping" end of ambulation back to room/end of session (not audible to therapist) but pt reporting no pain associated with this feeling (nursing notified immediately and reported she would contact MD about this).  Pt appearing anxious about going home and requesting another PT session this afternoon prior to discharge (nursing notified).   Follow Up Recommendations  Home health PT (assist for stairs)     Equipment Recommendations  Rolling walker with 5" wheels;3in1 (PT)    Recommendations for Other Services       Precautions / Restrictions Precautions Precautions: Fall Restrictions Weight Bearing Restrictions: Yes RLE Weight Bearing: Weight bearing as tolerated    Mobility  Bed Mobility Overal bed mobility: Modified Independent Bed Mobility: Supine to Sit     Supine to sit: Modified independent (Device/Increase time)     General bed mobility comments: Increased effort to perform but able to perform on own with increased time; bed flat  Transfers Overall transfer level: Needs assistance Equipment used: Rolling walker (2 wheeled) Transfers: Sit to/from Stand Sit to Stand: Supervision;Modified independent (Device/Increase time) (x5 trials)         General transfer comment: Pt initially requiring vc's for hand placement but after a couple repetitions pt able to perform on own without any more cueing  Ambulation/Gait Ambulation/Gait  assistance: Supervision Ambulation Distance (Feet):  (105 feet; 180 feet) Assistive device: Rolling walker (2 wheeled) Gait Pattern/deviations: Step-through pattern;Decreased stance time - right;Antalgic Gait velocity: decreased   General Gait Details: pt initially requiring vc's to increase R knee flexion during swing phase and for heel strike but with practice pt able to improve this   Stairs Stairs: Yes Stairs assistance:  (Min assist x2 1st trial; CGA 2nd trial) Stair Management: One rail Right;Step to pattern;Sideways Number of Stairs:  (4 stairs x2 trials) General stair comments: pt initially requiring vc's and demo for correct technique for stairs; pt's R knee buckling initially on 1st step (and a little on 2nd step) requiring assist to maintain upright but after that no knee buckling noted (no knee buckling noted on 2nd trial of stairs and no vc's required on 2nd trial of stairs)  Wheelchair Mobility    Modified Rankin (Stroke Patients Only)       Balance Overall balance assessment: Needs assistance Sitting-balance support: No upper extremity supported;Feet supported Sitting balance-Leahy Scale: Normal     Standing balance support: Bilateral upper extremity supported (on RW) Standing balance-Leahy Scale: Good                      Cognition Arousal/Alertness: Awake/alert Behavior During Therapy: Anxious Overall Cognitive Status: Within Functional Limits for tasks assessed                      Exercises Total Joint Exercises Goniometric ROM: R knee extension semi-supine 10 degrees short of neutral; R knee flexion 100 degrees in sitting  Performed semi-supine B LE therapeutic exercise x  10 reps:  Ankle pumps (AROM B LE's); quad sets x3 second holds (AROM B LE's); SAQ's (AROM R; AROM L); heelslides (AAROM R; AROM L), hip abd/adduction (AAROM R; AROM L), and SLR (AROM R; AROM L).  Pt required occasional vc's and tactile cues for correct technique with  exercises.     General Comments General comments (skin integrity, edema, etc.): minimal drainage noted on dressing  Nursing cleared pt for participation in physical therapy.  Pt agreeable to PT session. Answered pt's wife's and family member's questions regarding DME at beginning of session.     Pertinent Vitals/Pain Pain Assessment: 0-10 Pain Score: 4  Pain Location: R knee Pain Descriptors / Indicators: Operative site guarding Pain Intervention(s): Limited activity within patient's tolerance;Monitored during session;Premedicated before session;Repositioned;Ice applied  Vitals stable and WFL throughout treatment session.    Home Living                      Prior Function            PT Goals (current goals can now be found in the care plan section) Acute Rehab PT Goals Patient Stated Goal: to go home  PT Goal Formulation: With patient Time For Goal Achievement: 11/11/15 Potential to Achieve Goals: Good Additional Goals Additional Goal #1: Pt's R knee ROM 0-90 degrees. Progress towards PT goals: Progressing toward goals    Frequency  BID    PT Plan Current plan remains appropriate    Co-evaluation             End of Session Equipment Utilized During Treatment: Gait belt Activity Tolerance: Patient tolerated treatment well Patient left: in chair;with call bell/phone within reach;with chair alarm set;with SCD's reapplied (B heels elevated via towel rolls; polar care in place)     Time: [id number] PT Time Calculation (min) (ACUTE ONLY): 45 min  Charges:  $Gait Training: 8-22 mins $Therapeutic Exercise: 8-22 mins $Therapeutic Activity: 8-22 mins                    G CodesLeitha Bleak 2015/10/30, 11:04 AM Leitha Bleak, Friend

## 2015-10-29 NOTE — Progress Notes (Signed)
Physical Therapy Treatment Patient Details Name: Herbert Molina MRN: [id number] DOB: Feb 26, 1941 Today's Date: 10/29/2015    History of Present Illness Pt is a 75 y.o. male s/p R TKA secondary to degenerative OA 10/27/15.  PMH includes R meniscus repair, hiatal hernia, anemia, vertigo.    PT Comments    Pt reporting that he was told from nursing the "clicking/popping" sensation he had was normal and pt appearing more comfortable with going home this afternoon.  Pt able to ambulate around the loop with SBA and use of RW (pt appearing steady without loss of balance).  Pt modified independent with transfers with RW.  Pt reports he will discharge home with his wife and will have a family member meet them at the house to assist with stairs.  Pt reporting no further questions or concerns for discharge home.   Plan to discharge home this afternoon with support of family and HHPT.    Follow Up Recommendations  Home health PT (assist with stairs)     Equipment Recommendations  Rolling walker with 5" wheels;3in1 (PT)    Recommendations for Other Services       Precautions / Restrictions Precautions Precautions: Fall Restrictions Weight Bearing Restrictions: Yes RLE Weight Bearing: Weight bearing as tolerated    Mobility  Bed Mobility Deferred d/t pt already up in chair and requesting to be in chair end of session.            Transfers Overall transfer level: Modified independent Equipment used: Rolling walker (2 wheeled) Transfers: Sit to/from Stand Sit to Stand: Modified independent (Device/Increase time) Stand pivot transfers: Modified independent (Device/Increase time)       General transfer comment: no verbal cues required.  Ambulation/Gait Ambulation/Gait assistance: Supervision Ambulation Distance (Feet): 220 Feet Assistive device: Rolling walker (2 wheeled) Gait Pattern/deviations: Step-through pattern;Decreased step length - right;Antalgic Gait velocity: decreased   General Gait Details: Pt displayed increased competence with knee flexion during swing phase and heel strike during initial contact phases during gait.   Stairs Pt declined and reporting feeling comfortable with stairs for home discharge.  Wheelchair Mobility    Modified Rankin (Stroke Patients Only)       Balance Overall balance assessment: Modified Independent Sitting-balance support: No upper extremity supported;Feet supported Sitting balance-Leahy Scale: Normal     Standing balance support: Bilateral upper extremity supported (with RW) Standing balance-Leahy Scale: Good                      Cognition Arousal/Alertness: Awake/alert Behavior During Therapy: WFL for tasks assessed/performed Overall Cognitive Status: Within Functional Limits for tasks assessed                      Exercises Total Joint Exercises Long Arc QuadSinclair Ship;Right;10 reps (AROM through 80% of ROM, AAROM for last 20% of ROM ) Other Exercises Other Exercises: self sitting AAROM right knee flexion with contralateral LE assisting 1x5 with 15 second holds    General Comments General comments (skin integrity, edema, etc.): minimal drainage noted on dressing  Nursing cleared pt for participation in physical therapy.  Pt agreeable to PT session.      Pertinent Vitals/Pain Pain Assessment: 0-10 Pain Score: 5  (during ROM exercises) Pain Location: R knee Pain Descriptors / Indicators: Operative site guarding Pain Intervention(s): Monitored during session;Ice applied;Repositioned;Limited activity within patient's tolerance    Home Living  Prior Function            PT Goals (current goals can now be found in the care plan section) Acute Rehab PT Goals Patient Stated Goal: to go home  PT Goal Formulation: With patient Time For Goal Achievement: 11/11/15 Potential to Achieve Goals: Good Additional Goals Additional Goal #1: Pt's R knee ROM 0-90  degrees. Progress towards PT goals: Progressing toward goals    Frequency  Min 2X/week    PT Plan Current plan remains appropriate    Co-evaluation             End of Session Equipment Utilized During Treatment: Gait belt Activity Tolerance: Patient tolerated treatment well Patient left: in chair;with call bell/phone within reach;with chair alarm set;with SCD's reapplied (towel rolls applied bilaterally under heels, polar care in place')     Time: 1320-1349 PT Time Calculation (min) (ACUTE ONLY): 29 min  Charges:  $Gait Training: 8-22 mins $Therapeutic Exercise: 8-22 mins $Therapeutic Activity: 8-22 mins                    G Codes:      Mittie Bodo SPT Shanah Guimaraes 10/29/2015, 2:07 PM Leitha Bleak, [place]

## 2015-10-30 DIAGNOSIS — K219 Gastro-esophageal reflux disease without esophagitis: Secondary | ICD-10-CM | POA: Diagnosis not present

## 2015-10-30 DIAGNOSIS — I10 Essential (primary) hypertension: Secondary | ICD-10-CM | POA: Diagnosis not present

## 2015-10-30 DIAGNOSIS — D649 Anemia, unspecified: Secondary | ICD-10-CM | POA: Diagnosis not present

## 2015-10-30 DIAGNOSIS — Z96651 Presence of right artificial knee joint: Secondary | ICD-10-CM | POA: Diagnosis not present

## 2015-10-30 DIAGNOSIS — N400 Enlarged prostate without lower urinary tract symptoms: Secondary | ICD-10-CM | POA: Diagnosis not present

## 2015-10-30 DIAGNOSIS — Z471 Aftercare following joint replacement surgery: Secondary | ICD-10-CM | POA: Diagnosis not present

## 2015-10-30 DIAGNOSIS — M1990 Unspecified osteoarthritis, unspecified site: Secondary | ICD-10-CM | POA: Diagnosis not present

## 2015-10-31 DIAGNOSIS — I10 Essential (primary) hypertension: Secondary | ICD-10-CM | POA: Diagnosis not present

## 2015-10-31 DIAGNOSIS — D649 Anemia, unspecified: Secondary | ICD-10-CM | POA: Diagnosis not present

## 2015-10-31 DIAGNOSIS — Z96651 Presence of right artificial knee joint: Secondary | ICD-10-CM | POA: Diagnosis not present

## 2015-10-31 DIAGNOSIS — N400 Enlarged prostate without lower urinary tract symptoms: Secondary | ICD-10-CM | POA: Diagnosis not present

## 2015-10-31 DIAGNOSIS — M1990 Unspecified osteoarthritis, unspecified site: Secondary | ICD-10-CM | POA: Diagnosis not present

## 2015-10-31 DIAGNOSIS — Z471 Aftercare following joint replacement surgery: Secondary | ICD-10-CM | POA: Diagnosis not present

## 2015-10-31 DIAGNOSIS — K219 Gastro-esophageal reflux disease without esophagitis: Secondary | ICD-10-CM | POA: Diagnosis not present

## 2015-11-03 DIAGNOSIS — M1990 Unspecified osteoarthritis, unspecified site: Secondary | ICD-10-CM | POA: Diagnosis not present

## 2015-11-03 DIAGNOSIS — K219 Gastro-esophageal reflux disease without esophagitis: Secondary | ICD-10-CM | POA: Diagnosis not present

## 2015-11-03 DIAGNOSIS — Z96651 Presence of right artificial knee joint: Secondary | ICD-10-CM | POA: Diagnosis not present

## 2015-11-03 DIAGNOSIS — N400 Enlarged prostate without lower urinary tract symptoms: Secondary | ICD-10-CM | POA: Diagnosis not present

## 2015-11-03 DIAGNOSIS — I10 Essential (primary) hypertension: Secondary | ICD-10-CM | POA: Diagnosis not present

## 2015-11-03 DIAGNOSIS — D649 Anemia, unspecified: Secondary | ICD-10-CM | POA: Diagnosis not present

## 2015-11-03 DIAGNOSIS — Z471 Aftercare following joint replacement surgery: Secondary | ICD-10-CM | POA: Diagnosis not present

## 2015-11-05 DIAGNOSIS — Z96651 Presence of right artificial knee joint: Secondary | ICD-10-CM | POA: Diagnosis not present

## 2015-11-05 DIAGNOSIS — M1990 Unspecified osteoarthritis, unspecified site: Secondary | ICD-10-CM | POA: Diagnosis not present

## 2015-11-05 DIAGNOSIS — K219 Gastro-esophageal reflux disease without esophagitis: Secondary | ICD-10-CM | POA: Diagnosis not present

## 2015-11-05 DIAGNOSIS — Z471 Aftercare following joint replacement surgery: Secondary | ICD-10-CM | POA: Diagnosis not present

## 2015-11-05 DIAGNOSIS — N400 Enlarged prostate without lower urinary tract symptoms: Secondary | ICD-10-CM | POA: Diagnosis not present

## 2015-11-05 DIAGNOSIS — D649 Anemia, unspecified: Secondary | ICD-10-CM | POA: Diagnosis not present

## 2015-11-05 DIAGNOSIS — I10 Essential (primary) hypertension: Secondary | ICD-10-CM | POA: Diagnosis not present

## 2015-11-07 DIAGNOSIS — M1990 Unspecified osteoarthritis, unspecified site: Secondary | ICD-10-CM | POA: Diagnosis not present

## 2015-11-07 DIAGNOSIS — Z471 Aftercare following joint replacement surgery: Secondary | ICD-10-CM | POA: Diagnosis not present

## 2015-11-07 DIAGNOSIS — Z96651 Presence of right artificial knee joint: Secondary | ICD-10-CM | POA: Diagnosis not present

## 2015-11-07 DIAGNOSIS — K219 Gastro-esophageal reflux disease without esophagitis: Secondary | ICD-10-CM | POA: Diagnosis not present

## 2015-11-07 DIAGNOSIS — D649 Anemia, unspecified: Secondary | ICD-10-CM | POA: Diagnosis not present

## 2015-11-07 DIAGNOSIS — N400 Enlarged prostate without lower urinary tract symptoms: Secondary | ICD-10-CM | POA: Diagnosis not present

## 2015-11-07 DIAGNOSIS — I10 Essential (primary) hypertension: Secondary | ICD-10-CM | POA: Diagnosis not present

## 2015-11-10 DIAGNOSIS — N400 Enlarged prostate without lower urinary tract symptoms: Secondary | ICD-10-CM | POA: Diagnosis not present

## 2015-11-10 DIAGNOSIS — D649 Anemia, unspecified: Secondary | ICD-10-CM | POA: Diagnosis not present

## 2015-11-10 DIAGNOSIS — K219 Gastro-esophageal reflux disease without esophagitis: Secondary | ICD-10-CM | POA: Diagnosis not present

## 2015-11-10 DIAGNOSIS — M1990 Unspecified osteoarthritis, unspecified site: Secondary | ICD-10-CM | POA: Diagnosis not present

## 2015-11-10 DIAGNOSIS — Z471 Aftercare following joint replacement surgery: Secondary | ICD-10-CM | POA: Diagnosis not present

## 2015-11-10 DIAGNOSIS — Z96651 Presence of right artificial knee joint: Secondary | ICD-10-CM | POA: Diagnosis not present

## 2015-11-10 DIAGNOSIS — I10 Essential (primary) hypertension: Secondary | ICD-10-CM | POA: Diagnosis not present

## 2015-11-12 DIAGNOSIS — M1990 Unspecified osteoarthritis, unspecified site: Secondary | ICD-10-CM | POA: Diagnosis not present

## 2015-11-12 DIAGNOSIS — K219 Gastro-esophageal reflux disease without esophagitis: Secondary | ICD-10-CM | POA: Diagnosis not present

## 2015-11-12 DIAGNOSIS — D649 Anemia, unspecified: Secondary | ICD-10-CM | POA: Diagnosis not present

## 2015-11-12 DIAGNOSIS — I10 Essential (primary) hypertension: Secondary | ICD-10-CM | POA: Diagnosis not present

## 2015-11-12 DIAGNOSIS — Z471 Aftercare following joint replacement surgery: Secondary | ICD-10-CM | POA: Diagnosis not present

## 2015-11-12 DIAGNOSIS — N400 Enlarged prostate without lower urinary tract symptoms: Secondary | ICD-10-CM | POA: Diagnosis not present

## 2015-11-12 DIAGNOSIS — Z96651 Presence of right artificial knee joint: Secondary | ICD-10-CM | POA: Diagnosis not present

## 2015-11-14 DIAGNOSIS — M25561 Pain in right knee: Secondary | ICD-10-CM | POA: Diagnosis not present

## 2015-11-14 DIAGNOSIS — Z96651 Presence of right artificial knee joint: Secondary | ICD-10-CM | POA: Diagnosis not present

## 2015-11-18 DIAGNOSIS — M25561 Pain in right knee: Secondary | ICD-10-CM | POA: Diagnosis not present

## 2015-11-18 DIAGNOSIS — Z96651 Presence of right artificial knee joint: Secondary | ICD-10-CM | POA: Diagnosis not present

## 2015-11-20 DIAGNOSIS — M25561 Pain in right knee: Secondary | ICD-10-CM | POA: Diagnosis not present

## 2015-11-20 DIAGNOSIS — Z96651 Presence of right artificial knee joint: Secondary | ICD-10-CM | POA: Diagnosis not present

## 2015-11-25 DIAGNOSIS — Z96651 Presence of right artificial knee joint: Secondary | ICD-10-CM | POA: Diagnosis not present

## 2015-11-25 DIAGNOSIS — M25561 Pain in right knee: Secondary | ICD-10-CM | POA: Diagnosis not present

## 2015-11-27 DIAGNOSIS — Z96651 Presence of right artificial knee joint: Secondary | ICD-10-CM | POA: Diagnosis not present

## 2015-11-27 DIAGNOSIS — M25561 Pain in right knee: Secondary | ICD-10-CM | POA: Diagnosis not present

## 2015-12-01 DIAGNOSIS — M25561 Pain in right knee: Secondary | ICD-10-CM | POA: Diagnosis not present

## 2015-12-01 DIAGNOSIS — Z96651 Presence of right artificial knee joint: Secondary | ICD-10-CM | POA: Diagnosis not present

## 2015-12-03 DIAGNOSIS — Z96651 Presence of right artificial knee joint: Secondary | ICD-10-CM | POA: Diagnosis not present

## 2015-12-03 DIAGNOSIS — M25561 Pain in right knee: Secondary | ICD-10-CM | POA: Diagnosis not present

## 2015-12-03 DIAGNOSIS — D649 Anemia, unspecified: Secondary | ICD-10-CM | POA: Diagnosis not present

## 2015-12-03 DIAGNOSIS — Z471 Aftercare following joint replacement surgery: Secondary | ICD-10-CM | POA: Diagnosis not present

## 2015-12-03 DIAGNOSIS — I10 Essential (primary) hypertension: Secondary | ICD-10-CM | POA: Diagnosis not present

## 2015-12-05 DIAGNOSIS — M25561 Pain in right knee: Secondary | ICD-10-CM | POA: Diagnosis not present

## 2015-12-05 DIAGNOSIS — Z96651 Presence of right artificial knee joint: Secondary | ICD-10-CM | POA: Diagnosis not present

## 2015-12-08 DIAGNOSIS — Z96651 Presence of right artificial knee joint: Secondary | ICD-10-CM | POA: Diagnosis not present

## 2015-12-08 DIAGNOSIS — M25561 Pain in right knee: Secondary | ICD-10-CM | POA: Diagnosis not present

## 2015-12-09 DIAGNOSIS — Z96651 Presence of right artificial knee joint: Secondary | ICD-10-CM | POA: Diagnosis not present

## 2015-12-11 DIAGNOSIS — M25561 Pain in right knee: Secondary | ICD-10-CM | POA: Diagnosis not present

## 2015-12-11 DIAGNOSIS — Z96651 Presence of right artificial knee joint: Secondary | ICD-10-CM | POA: Diagnosis not present

## 2015-12-16 DIAGNOSIS — J0100 Acute maxillary sinusitis, unspecified: Secondary | ICD-10-CM | POA: Diagnosis not present

## 2015-12-18 DIAGNOSIS — Z96651 Presence of right artificial knee joint: Secondary | ICD-10-CM | POA: Diagnosis not present

## 2015-12-18 DIAGNOSIS — M25561 Pain in right knee: Secondary | ICD-10-CM | POA: Diagnosis not present

## 2015-12-24 DIAGNOSIS — M25561 Pain in right knee: Secondary | ICD-10-CM | POA: Diagnosis not present

## 2015-12-24 DIAGNOSIS — Z96651 Presence of right artificial knee joint: Secondary | ICD-10-CM | POA: Diagnosis not present

## 2015-12-26 DIAGNOSIS — M25561 Pain in right knee: Secondary | ICD-10-CM | POA: Diagnosis not present

## 2015-12-26 DIAGNOSIS — Z96651 Presence of right artificial knee joint: Secondary | ICD-10-CM | POA: Diagnosis not present

## 2015-12-30 DIAGNOSIS — M25561 Pain in right knee: Secondary | ICD-10-CM | POA: Diagnosis not present

## 2015-12-30 DIAGNOSIS — Z96651 Presence of right artificial knee joint: Secondary | ICD-10-CM | POA: Diagnosis not present

## 2016-01-02 DIAGNOSIS — Z96651 Presence of right artificial knee joint: Secondary | ICD-10-CM | POA: Diagnosis not present

## 2016-01-02 DIAGNOSIS — M25561 Pain in right knee: Secondary | ICD-10-CM | POA: Diagnosis not present

## 2016-01-05 DIAGNOSIS — Z96651 Presence of right artificial knee joint: Secondary | ICD-10-CM | POA: Diagnosis not present

## 2016-01-05 DIAGNOSIS — M25561 Pain in right knee: Secondary | ICD-10-CM | POA: Diagnosis not present

## 2016-01-08 DIAGNOSIS — Z96651 Presence of right artificial knee joint: Secondary | ICD-10-CM | POA: Diagnosis not present

## 2016-01-08 DIAGNOSIS — M25561 Pain in right knee: Secondary | ICD-10-CM | POA: Diagnosis not present

## 2016-01-12 DIAGNOSIS — Z96651 Presence of right artificial knee joint: Secondary | ICD-10-CM | POA: Diagnosis not present

## 2016-01-12 DIAGNOSIS — I10 Essential (primary) hypertension: Secondary | ICD-10-CM | POA: Diagnosis not present

## 2016-01-12 DIAGNOSIS — Z79899 Other long term (current) drug therapy: Secondary | ICD-10-CM | POA: Diagnosis not present

## 2016-01-12 DIAGNOSIS — E119 Type 2 diabetes mellitus without complications: Secondary | ICD-10-CM | POA: Diagnosis not present

## 2016-01-12 DIAGNOSIS — M25561 Pain in right knee: Secondary | ICD-10-CM | POA: Diagnosis not present

## 2016-01-12 DIAGNOSIS — E7800 Pure hypercholesterolemia, unspecified: Secondary | ICD-10-CM | POA: Diagnosis not present

## 2016-01-15 DIAGNOSIS — Z96651 Presence of right artificial knee joint: Secondary | ICD-10-CM | POA: Diagnosis not present

## 2016-01-15 DIAGNOSIS — M25561 Pain in right knee: Secondary | ICD-10-CM | POA: Diagnosis not present

## 2016-01-19 DIAGNOSIS — Z96651 Presence of right artificial knee joint: Secondary | ICD-10-CM | POA: Diagnosis not present

## 2016-01-19 DIAGNOSIS — M25561 Pain in right knee: Secondary | ICD-10-CM | POA: Diagnosis not present

## 2016-01-21 DIAGNOSIS — I10 Essential (primary) hypertension: Secondary | ICD-10-CM | POA: Diagnosis not present

## 2016-01-21 DIAGNOSIS — D649 Anemia, unspecified: Secondary | ICD-10-CM | POA: Diagnosis not present

## 2016-01-21 DIAGNOSIS — E7800 Pure hypercholesterolemia, unspecified: Secondary | ICD-10-CM | POA: Diagnosis not present

## 2016-02-04 DIAGNOSIS — M79674 Pain in right toe(s): Secondary | ICD-10-CM | POA: Diagnosis not present

## 2016-02-04 DIAGNOSIS — M79675 Pain in left toe(s): Secondary | ICD-10-CM | POA: Diagnosis not present

## 2016-02-04 DIAGNOSIS — B351 Tinea unguium: Secondary | ICD-10-CM | POA: Diagnosis not present

## 2016-03-02 DIAGNOSIS — M25512 Pain in left shoulder: Secondary | ICD-10-CM | POA: Diagnosis not present

## 2016-03-02 DIAGNOSIS — M7542 Impingement syndrome of left shoulder: Secondary | ICD-10-CM | POA: Diagnosis not present

## 2016-03-02 DIAGNOSIS — G8929 Other chronic pain: Secondary | ICD-10-CM | POA: Diagnosis not present

## 2016-03-25 DIAGNOSIS — H353131 Nonexudative age-related macular degeneration, bilateral, early dry stage: Secondary | ICD-10-CM | POA: Diagnosis not present

## 2016-04-02 DIAGNOSIS — H5712 Ocular pain, left eye: Secondary | ICD-10-CM | POA: Diagnosis not present

## 2016-04-15 DIAGNOSIS — D485 Neoplasm of uncertain behavior of skin: Secondary | ICD-10-CM | POA: Diagnosis not present

## 2016-04-15 DIAGNOSIS — Z85828 Personal history of other malignant neoplasm of skin: Secondary | ICD-10-CM | POA: Diagnosis not present

## 2016-04-15 DIAGNOSIS — L720 Epidermal cyst: Secondary | ICD-10-CM | POA: Diagnosis not present

## 2016-04-15 DIAGNOSIS — B353 Tinea pedis: Secondary | ICD-10-CM | POA: Diagnosis not present

## 2016-04-15 DIAGNOSIS — L812 Freckles: Secondary | ICD-10-CM | POA: Diagnosis not present

## 2016-04-15 DIAGNOSIS — D1800 Hemangioma unspecified site: Secondary | ICD-10-CM | POA: Diagnosis not present

## 2016-04-15 DIAGNOSIS — L578 Other skin changes due to chronic exposure to nonionizing radiation: Secondary | ICD-10-CM | POA: Diagnosis not present

## 2016-04-15 DIAGNOSIS — Z1283 Encounter for screening for malignant neoplasm of skin: Secondary | ICD-10-CM | POA: Diagnosis not present

## 2016-04-15 DIAGNOSIS — L821 Other seborrheic keratosis: Secondary | ICD-10-CM | POA: Diagnosis not present

## 2016-04-21 DIAGNOSIS — K219 Gastro-esophageal reflux disease without esophagitis: Secondary | ICD-10-CM | POA: Diagnosis not present

## 2016-04-21 DIAGNOSIS — Z1211 Encounter for screening for malignant neoplasm of colon: Secondary | ICD-10-CM | POA: Diagnosis not present

## 2016-04-21 DIAGNOSIS — E7800 Pure hypercholesterolemia, unspecified: Secondary | ICD-10-CM | POA: Diagnosis not present

## 2016-04-21 DIAGNOSIS — I10 Essential (primary) hypertension: Secondary | ICD-10-CM | POA: Diagnosis not present

## 2016-04-21 DIAGNOSIS — R413 Other amnesia: Secondary | ICD-10-CM | POA: Diagnosis not present

## 2016-04-21 DIAGNOSIS — D649 Anemia, unspecified: Secondary | ICD-10-CM | POA: Diagnosis not present

## 2016-04-22 DIAGNOSIS — Z96651 Presence of right artificial knee joint: Secondary | ICD-10-CM | POA: Diagnosis not present

## 2016-05-03 DIAGNOSIS — Z1211 Encounter for screening for malignant neoplasm of colon: Secondary | ICD-10-CM | POA: Diagnosis not present

## 2016-05-14 DIAGNOSIS — M79674 Pain in right toe(s): Secondary | ICD-10-CM | POA: Diagnosis not present

## 2016-05-14 DIAGNOSIS — M79675 Pain in left toe(s): Secondary | ICD-10-CM | POA: Diagnosis not present

## 2016-05-14 DIAGNOSIS — B351 Tinea unguium: Secondary | ICD-10-CM | POA: Diagnosis not present

## 2016-06-02 DIAGNOSIS — R3915 Urgency of urination: Secondary | ICD-10-CM | POA: Diagnosis not present

## 2016-06-02 DIAGNOSIS — N401 Enlarged prostate with lower urinary tract symptoms: Secondary | ICD-10-CM | POA: Diagnosis not present

## 2016-06-02 DIAGNOSIS — R3914 Feeling of incomplete bladder emptying: Secondary | ICD-10-CM | POA: Diagnosis not present

## 2016-06-03 DIAGNOSIS — K2990 Gastroduodenitis, unspecified, without bleeding: Secondary | ICD-10-CM | POA: Diagnosis not present

## 2016-06-03 DIAGNOSIS — R195 Other fecal abnormalities: Secondary | ICD-10-CM | POA: Diagnosis not present

## 2016-06-11 DIAGNOSIS — R195 Other fecal abnormalities: Secondary | ICD-10-CM | POA: Diagnosis not present

## 2016-06-30 DIAGNOSIS — M6738 Transient synovitis, other site: Secondary | ICD-10-CM | POA: Diagnosis not present

## 2016-07-07 DIAGNOSIS — E7800 Pure hypercholesterolemia, unspecified: Secondary | ICD-10-CM | POA: Diagnosis not present

## 2016-07-07 DIAGNOSIS — Z79899 Other long term (current) drug therapy: Secondary | ICD-10-CM | POA: Diagnosis not present

## 2016-07-07 DIAGNOSIS — I10 Essential (primary) hypertension: Secondary | ICD-10-CM | POA: Diagnosis not present

## 2016-07-07 DIAGNOSIS — Z23 Encounter for immunization: Secondary | ICD-10-CM | POA: Diagnosis not present

## 2016-07-07 DIAGNOSIS — M1990 Unspecified osteoarthritis, unspecified site: Secondary | ICD-10-CM | POA: Diagnosis not present

## 2016-07-07 DIAGNOSIS — R7309 Other abnormal glucose: Secondary | ICD-10-CM | POA: Diagnosis not present

## 2016-08-09 DIAGNOSIS — I10 Essential (primary) hypertension: Secondary | ICD-10-CM | POA: Diagnosis not present

## 2016-08-09 DIAGNOSIS — E7800 Pure hypercholesterolemia, unspecified: Secondary | ICD-10-CM | POA: Diagnosis not present

## 2016-08-09 DIAGNOSIS — Z79899 Other long term (current) drug therapy: Secondary | ICD-10-CM | POA: Diagnosis not present

## 2016-08-09 DIAGNOSIS — R7309 Other abnormal glucose: Secondary | ICD-10-CM | POA: Diagnosis not present

## 2016-09-17 DIAGNOSIS — H40003 Preglaucoma, unspecified, bilateral: Secondary | ICD-10-CM | POA: Diagnosis not present

## 2016-09-20 DIAGNOSIS — H40003 Preglaucoma, unspecified, bilateral: Secondary | ICD-10-CM | POA: Diagnosis not present

## 2016-09-21 DIAGNOSIS — M7752 Other enthesopathy of left foot: Secondary | ICD-10-CM | POA: Diagnosis not present

## 2016-09-21 DIAGNOSIS — R234 Changes in skin texture: Secondary | ICD-10-CM | POA: Diagnosis not present

## 2016-09-21 DIAGNOSIS — B351 Tinea unguium: Secondary | ICD-10-CM | POA: Diagnosis not present

## 2016-09-21 DIAGNOSIS — M79675 Pain in left toe(s): Secondary | ICD-10-CM | POA: Diagnosis not present

## 2016-09-21 DIAGNOSIS — Q6689 Other  specified congenital deformities of feet: Secondary | ICD-10-CM | POA: Diagnosis not present

## 2016-09-21 DIAGNOSIS — M79674 Pain in right toe(s): Secondary | ICD-10-CM | POA: Diagnosis not present

## 2016-10-26 DIAGNOSIS — Z96651 Presence of right artificial knee joint: Secondary | ICD-10-CM | POA: Diagnosis not present

## 2016-10-29 DIAGNOSIS — I10 Essential (primary) hypertension: Secondary | ICD-10-CM | POA: Diagnosis not present

## 2016-10-29 DIAGNOSIS — E7800 Pure hypercholesterolemia, unspecified: Secondary | ICD-10-CM | POA: Diagnosis not present

## 2016-10-29 DIAGNOSIS — D649 Anemia, unspecified: Secondary | ICD-10-CM | POA: Diagnosis not present

## 2016-11-25 DIAGNOSIS — H8110 Benign paroxysmal vertigo, unspecified ear: Secondary | ICD-10-CM | POA: Diagnosis not present

## 2017-01-28 DIAGNOSIS — I10 Essential (primary) hypertension: Secondary | ICD-10-CM | POA: Diagnosis not present

## 2017-01-28 DIAGNOSIS — Z79899 Other long term (current) drug therapy: Secondary | ICD-10-CM | POA: Diagnosis not present

## 2017-01-28 DIAGNOSIS — E7800 Pure hypercholesterolemia, unspecified: Secondary | ICD-10-CM | POA: Diagnosis not present

## 2017-01-28 DIAGNOSIS — R7309 Other abnormal glucose: Secondary | ICD-10-CM | POA: Diagnosis not present

## 2017-01-28 DIAGNOSIS — Z125 Encounter for screening for malignant neoplasm of prostate: Secondary | ICD-10-CM | POA: Diagnosis not present

## 2017-01-28 DIAGNOSIS — R42 Dizziness and giddiness: Secondary | ICD-10-CM | POA: Diagnosis not present

## 2017-02-15 DIAGNOSIS — I6523 Occlusion and stenosis of bilateral carotid arteries: Secondary | ICD-10-CM | POA: Diagnosis not present

## 2017-02-15 DIAGNOSIS — R42 Dizziness and giddiness: Secondary | ICD-10-CM | POA: Diagnosis not present

## 2017-02-24 DIAGNOSIS — M79675 Pain in left toe(s): Secondary | ICD-10-CM | POA: Diagnosis not present

## 2017-02-24 DIAGNOSIS — B351 Tinea unguium: Secondary | ICD-10-CM | POA: Diagnosis not present

## 2017-02-24 DIAGNOSIS — M79674 Pain in right toe(s): Secondary | ICD-10-CM | POA: Diagnosis not present

## 2017-03-25 DIAGNOSIS — I10 Essential (primary) hypertension: Secondary | ICD-10-CM | POA: Diagnosis not present

## 2017-03-25 DIAGNOSIS — E7800 Pure hypercholesterolemia, unspecified: Secondary | ICD-10-CM | POA: Diagnosis not present

## 2017-03-25 DIAGNOSIS — Z79899 Other long term (current) drug therapy: Secondary | ICD-10-CM | POA: Diagnosis not present

## 2017-03-25 DIAGNOSIS — R7309 Other abnormal glucose: Secondary | ICD-10-CM | POA: Diagnosis not present

## 2017-03-25 DIAGNOSIS — Z125 Encounter for screening for malignant neoplasm of prostate: Secondary | ICD-10-CM | POA: Diagnosis not present

## 2017-03-28 DIAGNOSIS — H353131 Nonexudative age-related macular degeneration, bilateral, early dry stage: Secondary | ICD-10-CM | POA: Diagnosis not present

## 2017-03-28 DIAGNOSIS — H40003 Preglaucoma, unspecified, bilateral: Secondary | ICD-10-CM | POA: Diagnosis not present

## 2017-03-30 DIAGNOSIS — M542 Cervicalgia: Secondary | ICD-10-CM | POA: Diagnosis not present

## 2017-03-30 DIAGNOSIS — I10 Essential (primary) hypertension: Secondary | ICD-10-CM | POA: Diagnosis not present

## 2017-03-30 DIAGNOSIS — M4802 Spinal stenosis, cervical region: Secondary | ICD-10-CM | POA: Diagnosis not present

## 2017-03-30 DIAGNOSIS — E7800 Pure hypercholesterolemia, unspecified: Secondary | ICD-10-CM | POA: Diagnosis not present

## 2017-04-01 ENCOUNTER — Other Ambulatory Visit: Payer: Self-pay | Admitting: Internal Medicine

## 2017-04-01 DIAGNOSIS — M5030 Other cervical disc degeneration, unspecified cervical region: Secondary | ICD-10-CM

## 2017-04-08 ENCOUNTER — Ambulatory Visit
Admission: RE | Admit: 2017-04-08 | Discharge: 2017-04-08 | Disposition: A | Discharge status: Home / Self Care | Payer: Medicare HMO | Source: Ambulatory Visit | Attending: Internal Medicine | Admitting: Internal Medicine

## 2017-04-08 DIAGNOSIS — M47812 Spondylosis without myelopathy or radiculopathy, cervical region: Secondary | ICD-10-CM | POA: Diagnosis not present

## 2017-04-08 DIAGNOSIS — M5030 Other cervical disc degeneration, unspecified cervical region: Secondary | ICD-10-CM

## 2017-04-08 DIAGNOSIS — M5382 Other specified dorsopathies, cervical region: Secondary | ICD-10-CM | POA: Insufficient documentation

## 2017-04-08 DIAGNOSIS — M542 Cervicalgia: Secondary | ICD-10-CM | POA: Diagnosis not present

## 2017-04-14 DIAGNOSIS — M2392 Unspecified internal derangement of left knee: Secondary | ICD-10-CM | POA: Diagnosis not present

## 2017-04-14 DIAGNOSIS — M25562 Pain in left knee: Secondary | ICD-10-CM | POA: Diagnosis not present

## 2017-04-20 DIAGNOSIS — L820 Inflamed seborrheic keratosis: Secondary | ICD-10-CM | POA: Diagnosis not present

## 2017-04-20 DIAGNOSIS — D1800 Hemangioma unspecified site: Secondary | ICD-10-CM | POA: Diagnosis not present

## 2017-04-20 DIAGNOSIS — Z85828 Personal history of other malignant neoplasm of skin: Secondary | ICD-10-CM | POA: Diagnosis not present

## 2017-04-20 DIAGNOSIS — D485 Neoplasm of uncertain behavior of skin: Secondary | ICD-10-CM | POA: Diagnosis not present

## 2017-04-20 DIAGNOSIS — L821 Other seborrheic keratosis: Secondary | ICD-10-CM | POA: Diagnosis not present

## 2017-04-20 DIAGNOSIS — L720 Epidermal cyst: Secondary | ICD-10-CM | POA: Diagnosis not present

## 2017-04-20 DIAGNOSIS — L812 Freckles: Secondary | ICD-10-CM | POA: Diagnosis not present

## 2017-04-20 DIAGNOSIS — D229 Melanocytic nevi, unspecified: Secondary | ICD-10-CM | POA: Diagnosis not present

## 2017-04-20 DIAGNOSIS — L578 Other skin changes due to chronic exposure to nonionizing radiation: Secondary | ICD-10-CM | POA: Diagnosis not present

## 2017-05-03 DIAGNOSIS — M542 Cervicalgia: Secondary | ICD-10-CM | POA: Diagnosis not present

## 2017-05-18 DIAGNOSIS — M7702 Medial epicondylitis, left elbow: Secondary | ICD-10-CM | POA: Diagnosis not present

## 2017-05-18 DIAGNOSIS — M542 Cervicalgia: Secondary | ICD-10-CM | POA: Diagnosis not present

## 2017-05-24 DIAGNOSIS — M542 Cervicalgia: Secondary | ICD-10-CM | POA: Diagnosis not present

## 2017-05-27 DIAGNOSIS — M542 Cervicalgia: Secondary | ICD-10-CM | POA: Diagnosis not present

## 2017-06-10 DIAGNOSIS — M542 Cervicalgia: Secondary | ICD-10-CM | POA: Diagnosis not present

## 2017-06-22 DIAGNOSIS — M542 Cervicalgia: Secondary | ICD-10-CM | POA: Diagnosis not present

## 2017-06-23 DIAGNOSIS — D649 Anemia, unspecified: Secondary | ICD-10-CM | POA: Diagnosis not present

## 2017-06-23 DIAGNOSIS — M79641 Pain in right hand: Secondary | ICD-10-CM | POA: Diagnosis not present

## 2017-06-23 DIAGNOSIS — I10 Essential (primary) hypertension: Secondary | ICD-10-CM | POA: Diagnosis not present

## 2017-06-23 DIAGNOSIS — E7800 Pure hypercholesterolemia, unspecified: Secondary | ICD-10-CM | POA: Diagnosis not present

## 2017-06-23 DIAGNOSIS — Z1211 Encounter for screening for malignant neoplasm of colon: Secondary | ICD-10-CM | POA: Diagnosis not present

## 2017-06-29 DIAGNOSIS — Z1211 Encounter for screening for malignant neoplasm of colon: Secondary | ICD-10-CM | POA: Diagnosis not present

## 2017-07-16 DIAGNOSIS — S61451A Open bite of right hand, initial encounter: Secondary | ICD-10-CM | POA: Diagnosis not present

## 2017-07-16 DIAGNOSIS — Z23 Encounter for immunization: Secondary | ICD-10-CM | POA: Diagnosis not present

## 2017-07-16 DIAGNOSIS — W5501XA Bitten by cat, initial encounter: Secondary | ICD-10-CM | POA: Diagnosis not present

## 2017-08-02 DIAGNOSIS — L821 Other seborrheic keratosis: Secondary | ICD-10-CM | POA: Diagnosis not present

## 2017-08-02 DIAGNOSIS — L739 Follicular disorder, unspecified: Secondary | ICD-10-CM | POA: Diagnosis not present

## 2017-08-02 DIAGNOSIS — L820 Inflamed seborrheic keratosis: Secondary | ICD-10-CM | POA: Diagnosis not present

## 2017-08-02 DIAGNOSIS — Z23 Encounter for immunization: Secondary | ICD-10-CM | POA: Diagnosis not present

## 2017-08-02 DIAGNOSIS — L578 Other skin changes due to chronic exposure to nonionizing radiation: Secondary | ICD-10-CM | POA: Diagnosis not present

## 2017-08-10 DIAGNOSIS — B351 Tinea unguium: Secondary | ICD-10-CM | POA: Diagnosis not present

## 2017-08-10 DIAGNOSIS — M79675 Pain in left toe(s): Secondary | ICD-10-CM | POA: Diagnosis not present

## 2017-08-10 DIAGNOSIS — M79674 Pain in right toe(s): Secondary | ICD-10-CM | POA: Diagnosis not present

## 2017-08-16 DIAGNOSIS — R234 Changes in skin texture: Secondary | ICD-10-CM | POA: Diagnosis not present

## 2017-08-16 DIAGNOSIS — L02212 Cutaneous abscess of back [any part, except buttock]: Secondary | ICD-10-CM | POA: Diagnosis not present

## 2017-08-16 DIAGNOSIS — R208 Other disturbances of skin sensation: Secondary | ICD-10-CM | POA: Diagnosis not present

## 2017-08-16 DIAGNOSIS — L720 Epidermal cyst: Secondary | ICD-10-CM | POA: Diagnosis not present

## 2017-08-30 DIAGNOSIS — L720 Epidermal cyst: Secondary | ICD-10-CM | POA: Diagnosis not present

## 2017-09-06 DIAGNOSIS — L720 Epidermal cyst: Secondary | ICD-10-CM | POA: Diagnosis not present

## 2017-09-13 DIAGNOSIS — L720 Epidermal cyst: Secondary | ICD-10-CM | POA: Diagnosis not present

## 2017-09-13 DIAGNOSIS — L578 Other skin changes due to chronic exposure to nonionizing radiation: Secondary | ICD-10-CM | POA: Diagnosis not present

## 2017-09-13 DIAGNOSIS — Z808 Family history of malignant neoplasm of other organs or systems: Secondary | ICD-10-CM | POA: Diagnosis not present

## 2017-09-13 DIAGNOSIS — L570 Actinic keratosis: Secondary | ICD-10-CM | POA: Diagnosis not present

## 2017-09-13 DIAGNOSIS — L821 Other seborrheic keratosis: Secondary | ICD-10-CM | POA: Diagnosis not present

## 2017-09-13 DIAGNOSIS — L820 Inflamed seborrheic keratosis: Secondary | ICD-10-CM | POA: Diagnosis not present

## 2017-09-16 DIAGNOSIS — Z125 Encounter for screening for malignant neoplasm of prostate: Secondary | ICD-10-CM | POA: Diagnosis not present

## 2017-09-16 DIAGNOSIS — D649 Anemia, unspecified: Secondary | ICD-10-CM | POA: Diagnosis not present

## 2017-09-16 DIAGNOSIS — H40003 Preglaucoma, unspecified, bilateral: Secondary | ICD-10-CM | POA: Diagnosis not present

## 2017-09-16 DIAGNOSIS — Z79899 Other long term (current) drug therapy: Secondary | ICD-10-CM | POA: Diagnosis not present

## 2017-09-16 DIAGNOSIS — I10 Essential (primary) hypertension: Secondary | ICD-10-CM | POA: Diagnosis not present

## 2017-09-16 DIAGNOSIS — R7309 Other abnormal glucose: Secondary | ICD-10-CM | POA: Diagnosis not present

## 2017-09-16 DIAGNOSIS — E7800 Pure hypercholesterolemia, unspecified: Secondary | ICD-10-CM | POA: Diagnosis not present

## 2017-09-21 DIAGNOSIS — H40003 Preglaucoma, unspecified, bilateral: Secondary | ICD-10-CM | POA: Diagnosis not present

## 2017-10-24 DIAGNOSIS — I10 Essential (primary) hypertension: Secondary | ICD-10-CM | POA: Diagnosis not present

## 2017-10-24 DIAGNOSIS — R7309 Other abnormal glucose: Secondary | ICD-10-CM | POA: Diagnosis not present

## 2017-10-24 DIAGNOSIS — Z79899 Other long term (current) drug therapy: Secondary | ICD-10-CM | POA: Diagnosis not present

## 2017-10-24 DIAGNOSIS — Z125 Encounter for screening for malignant neoplasm of prostate: Secondary | ICD-10-CM | POA: Diagnosis not present

## 2017-10-24 DIAGNOSIS — E7800 Pure hypercholesterolemia, unspecified: Secondary | ICD-10-CM | POA: Diagnosis not present

## 2017-10-27 DIAGNOSIS — Z96651 Presence of right artificial knee joint: Secondary | ICD-10-CM | POA: Diagnosis not present

## 2017-11-23 ENCOUNTER — Other Ambulatory Visit: Payer: Self-pay

## 2017-11-23 ENCOUNTER — Emergency Department
Admission: EM | Admit: 2017-11-23 | Discharge: 2017-11-23 | Disposition: A | Discharge status: Home / Self Care | Payer: Medicare HMO | Attending: Emergency Medicine | Admitting: Emergency Medicine

## 2017-11-23 ENCOUNTER — Emergency Department: Payer: Medicare HMO

## 2017-11-23 DIAGNOSIS — I10 Essential (primary) hypertension: Secondary | ICD-10-CM | POA: Diagnosis not present

## 2017-11-23 DIAGNOSIS — S80812A Abrasion, left lower leg, initial encounter: Secondary | ICD-10-CM

## 2017-11-23 DIAGNOSIS — S8992XA Unspecified injury of left lower leg, initial encounter: Secondary | ICD-10-CM | POA: Diagnosis not present

## 2017-11-23 DIAGNOSIS — Y999 Unspecified external cause status: Secondary | ICD-10-CM | POA: Diagnosis not present

## 2017-11-23 DIAGNOSIS — Y92008 Other place in unspecified non-institutional (private) residence as the place of occurrence of the external cause: Secondary | ICD-10-CM | POA: Diagnosis not present

## 2017-11-23 DIAGNOSIS — M25572 Pain in left ankle and joints of left foot: Secondary | ICD-10-CM | POA: Diagnosis not present

## 2017-11-23 DIAGNOSIS — S99912A Unspecified injury of left ankle, initial encounter: Secondary | ICD-10-CM | POA: Diagnosis not present

## 2017-11-23 DIAGNOSIS — Y939 Activity, unspecified: Secondary | ICD-10-CM | POA: Insufficient documentation

## 2017-11-23 DIAGNOSIS — Z79899 Other long term (current) drug therapy: Secondary | ICD-10-CM | POA: Diagnosis not present

## 2017-11-23 DIAGNOSIS — Z7982 Long term (current) use of aspirin: Secondary | ICD-10-CM | POA: Insufficient documentation

## 2017-11-23 DIAGNOSIS — W133XXA Fall through floor, initial encounter: Secondary | ICD-10-CM | POA: Insufficient documentation

## 2017-11-23 DIAGNOSIS — M25462 Effusion, left knee: Secondary | ICD-10-CM | POA: Insufficient documentation

## 2017-11-23 DIAGNOSIS — M79662 Pain in left lower leg: Secondary | ICD-10-CM | POA: Diagnosis not present

## 2017-11-23 MED ORDER — TRAMADOL HCL 50 MG PO TABS: 50.0000 mg | ORAL_TABLET | Freq: Two times a day (BID) | ORAL | 0 refills | Status: AC | PRN

## 2017-11-23 MED ORDER — BACITRACIN ZINC 500 UNIT/GM EX OINT
TOPICAL_OINTMENT | Freq: Once | CUTANEOUS | Status: AC
  Administered 2017-11-23: 1 via TOPICAL
  Filled 2017-11-23: qty 0.9

## 2017-11-23 MED ORDER — NAPROXEN 375 MG PO TABS: 375.0000 mg | ORAL_TABLET | Freq: Two times a day (BID) | ORAL | 0 refills | Status: AC

## 2017-11-23 NOTE — ED Triage Notes (Signed)
Pt presents to ED for falling through his attic roof. Pt was on the walk board in attic and stepped off and legs went through but someone caught him by his shoulders and pulled him back up before going all the way through. States L leg pain from ankle to knee. Has abraison noted to L shin. Also c/o R knee pain (hx of replacement on R knee). Uses cane to walk. Alert, oriented. Denies blood thinners, denies hitting head or LOC.

## 2017-11-23 NOTE — Discharge Instructions (Addendum)
Knee immobilizer for 3-5 days as needed

## 2017-11-23 NOTE — ED Triage Notes (Signed)
Pt moving both legs in triage. Bending both knees and moving both ankles around. No obvious deformity noted. Not tender to touch on L shin,

## 2017-11-23 NOTE — ED Triage Notes (Signed)
Pt states he iced leg and took tylenol and oxycodone at home.

## 2017-11-23 NOTE — ED Provider Notes (Signed)
[facility] Emergency Department Provider Note   ____________________________________________   First MD Initiated Contact with Patient 11/23/17 1513     (approximate)  I have reviewed the triage vital signs and the nursing notes.   HISTORY  Chief Complaint Fall and Leg Pain    HPI Herbert Molina is a 77 y.o. male patient complaining of left lower leg pain secondary to fall i through the ceiling.  Patient was caught by a friend before he fell completely through the ceiling.  Patient state pain from his knee down to his ankles.  Patient also state has abrasion to the anterior and lateral aspect of the left lower leg.  Patient able to bear weight with difficulty.  Patient ambulates with a cane.  Patient has a right knee replacement.  Patient rates pain as a 10/10.  Patient described the pain is "achy".  Patient applied ice to the leg and took a oxycodone prior to arrival. Past Medical History:  Diagnosis Date  . Anemia   . Arthritis   . BPH (benign prostatic hypertrophy)   . GERD (gastroesophageal reflux disease)   . History of hiatal hernia   . Hypertension   . Vertigo     Patient Active Problem List   Diagnosis Date Noted  . Right knee DJD 10/27/2015  . Total knee replacement status 10/27/2015    Past Surgical History:  Procedure Laterality Date  . HAMMER TOE SURGERY Left   . HERNIA REPAIR     umbilical  . KNEE ARTHROPLASTY Right 10/27/2015   Procedure: COMPUTER ASSISTED TOTAL KNEE ARTHROPLASTY;  Surgeon: Dereck Leep, MD;  Location: [facility] ORS;  Service: Orthopedics;  Laterality: Right;  . KNEE ARTHROSCOPY WITH MEDIAL MENISECTOMY Right 02/18/2015   Procedure: KNEE ARTHROSCOPY WITH MEDIAL MENISECTOMY;  Surgeon: Christophe Louis, MD;  Location: [facility] ORS;  Service: Orthopedics;  Laterality: Right;  . LASER OF PROSTATE W/ GREEN LIGHT PVP    . SEPTOPLASTY    . SHOULDER ARTHROSCOPY WITH CAPSULORRHAPHY Right     Prior to Admission medications     Medication Sig Start Date End Date Taking? Authorizing Provider  acyclovir ointment (ZOVIRAX) 5 % Apply 1 application topically daily as needed.    [provider]  aspirin EC 81 MG tablet Take 81 mg by mouth daily.    [provider]  atorvastatin (LIPITOR) 10 MG tablet Take 10 mg by mouth at bedtime.    [provider]  cholecalciferol (VITAMIN D) 1000 UNITS tablet Take 2,000 Units by mouth daily.     [provider]  co-enzyme Q-10 30 MG capsule Take 100 mg by mouth daily.     [provider]  docusate sodium (COLACE) 100 MG capsule Take 100 mg by mouth at bedtime.    [provider]  donepezil (ARICEPT) 5 MG tablet Take 5 mg by mouth at bedtime.    [provider]  enoxaparin (LOVENOX) 40 MG/0.4ML injection Inject 0.4 mLs (40 mg total) into the skin daily. 10/28/15   Watt Climes, PA  Garlic 7628 MG CAPS Take 1,000 mg by mouth 2 (two) times daily.     [provider]  Homeopathic Products ([employer] LEG CRAMPS PAIN RELIEF PO) Take 1 tablet by mouth daily as needed.    [provider]  HYDROcodone-acetaminophen (NORCO/VICODIN) 5-325 MG per tablet Take 1 tablet by mouth every 6 (six) hours as needed for moderate pain.    [provider]  ketoconazole (NIZORAL) 2 % cream  Apply 1 application topically daily as needed for irritation.    [provider]  meclizine (ANTIVERT) 25 MG tablet Take 25 mg by mouth 3 (three) times daily as needed for dizziness.    [provider]  Multiple Vitamins-Minerals (ONE-A-DAY 50 PLUS PO) Take 1 capsule by mouth daily.    [provider]  naproxen (NAPROSYN) 375 MG tablet Take 1 tablet (375 mg total) by mouth 2 (two) times daily with a meal. 11/23/17 11/23/18  Sable Feil, PA-C  Omega-3 Fatty Acids (OMEGA 3 500 PO) Take 1 tablet by mouth at bedtime.    [provider]  omeprazole (PRILOSEC) 20 MG capsule Take 20 mg by mouth 2 (two) times daily  before a meal.    [provider]  oxyCODONE (OXY IR/ROXICODONE) 5 MG immediate release tablet Take 1-2 tablets (5-10 mg total) by mouth every 4 (four) hours as needed for severe pain or breakthrough pain. 10/28/15   Watt Climes, PA  Potassium Gluconate 595 MG CAPS Take 1 capsule by mouth daily.    [provider]  sucralfate (CARAFATE) 1 G tablet Take 1 g by mouth daily.    [provider]  traMADol (ULTRAM) 50 MG tablet Take 1-2 tablets (50-100 mg total) by mouth every 4 (four) hours as needed for moderate pain. 10/28/15   Watt Climes, PA  traMADol (ULTRAM) 50 MG tablet Take 1 tablet (50 mg total) by mouth every 12 (twelve) hours as needed. 11/23/17   Sable Feil, PA-C  valACYclovir (VALTREX) 500 MG tablet Take 500 mg by mouth daily as needed.    [provider]  vitamin E 400 UNIT capsule Take 400 Units by mouth daily.    [provider]    Allergies Patient has no known allergies.  Family History  Problem Relation Age of Onset  . Stroke Mother   . Heart attack Father   . Stroke Father   . Heart attack Brother     Social History Social History   Tobacco Use  . Smoking status: Never Smoker  . Smokeless tobacco: Never Used  Substance Use Topics  . Alcohol use: No  . Drug use: No    Review of Systems Constitutional: No fever/chills Eyes: No visual changes. ENT: No sore throat. Cardiovascular: Denies chest pain. Respiratory: Denies shortness of breath. Gastrointestinal: No abdominal pain.  No nausea, no vomiting.  No diarrhea.  No constipation. Genitourinary: Negative for dysuria. Musculoskeletal: Negative for back pain. Skin: Negative for rash. Neurological: Negative for headaches, focal weakness or numbness. Endocrine:Hypertension   ____________________________________________   PHYSICAL EXAM:  VITAL SIGNS: ED Triage Vitals  Enc Vitals Group     BP 11/23/17 1341 126/82     Pulse Rate 11/23/17 1341 67     Resp  11/23/17 1341 16     Temp 11/23/17 1341 98.4 F (36.9 C)     Temp Source 11/23/17 1341 Oral     SpO2 11/23/17 1341 96 %     Weight 11/23/17 1342 205 lb (93 kg)     Height 11/23/17 1342 5\' 8"  (1.727 m)     Head Circumference --      Peak Flow --      Pain Score 11/23/17 1342 10     Pain Loc --      Pain Edu? --      Excl. in [place]? --    Constitutional: Alert and oriented. Well appearing and in no acute distress. Cardiovascular: Normal rate,  regular rhythm. Grossly normal heart sounds.  Good peripheral circulation. Respiratory: Normal respiratory effort.  No retractions. Lungs CTAB. Musculoskeletal: No obvious deformity edema or erythema to the knee.  Patient has moderate guarding palpation of the left knee and medial aspect of the left ankle.Marland Kitchen Neurologic:  Normal speech and language. No gross focal neurologic deficits are appreciated. No gait instability. Skin:  Skin is warm, dry and intact. No rash noted.  Superficial abrasion to the anterior lateral aspect of the left lower leg. Psychiatric: Mood and affect are normal. Speech and behavior are normal.  ____________________________________________   LABS (all labs ordered are listed, but only abnormal results are displayed)  Labs Reviewed - No data to display ____________________________________________  EKG   ____________________________________________  RADIOLOGY  ED MD interpretation: No acute bony abnormalities.  Patient has moderate arthritis.  Patient also has a mild effusion to the left knee.  No acute findings of the left tib-fib or left ankle.  Official radiology report(s): Dg Tibia/fibula Left  Result Date: 11/23/2017 CLINICAL DATA:  [place] through attic roof.  Lower extremity pain. EXAM: LEFT TIBIA AND FIBULA - 2 VIEW COMPARISON:  None. FINDINGS: There is no evidence of fracture or other focal bone lesions. Soft tissues are unremarkable. IMPRESSION: Negative. Electronically Signed   By: Nelson Chimes M.D.   On:  11/23/2017 14:40   Dg Ankle Complete Left  Result Date: 11/23/2017 CLINICAL DATA:  [place] through attic roof.  Ankle pain. EXAM: LEFT ANKLE COMPLETE - 3+ VIEW COMPARISON:  None. FINDINGS: There is no evidence of fracture, dislocation, or joint effusion. There is no evidence of arthropathy or other focal bone abnormality. Soft tissues are unremarkable. IMPRESSION: Negative. Electronically Signed   By: Nelson Chimes M.D.   On: 11/23/2017 14:40   Dg Knee Complete 4 Views Left  Result Date: 11/23/2017 CLINICAL DATA:  [place] through attic roof.  Knee pain. EXAM: LEFT KNEE - COMPLETE 4+ VIEW COMPARISON:  None. FINDINGS: Moderate size knee joint effusion. No evidence of fracture. Chronic degenerative chondrocalcinosis of the menisci. No significant joint space narrowing. IMPRESSION: Joint effusion which could be due to acute internal derangement or chronic degenerative disease. No sign of acute bone finding. Electronically Signed   By: Nelson Chimes M.D.   On: 11/23/2017 14:41    ____________________________________________   PROCEDURES  Procedure(s) performed: None  Procedures  Critical Care performed: No  ____________________________________________   INITIAL IMPRESSION / ASSESSMENT AND PLAN / ED COURSE  As part of my medical decision making, I reviewed the following data within the electronic MEDICAL RECORD NUMBER    Pain and effusion left knee.  Discussed x-ray findings with patient.  Patient was placed in a knee immobilizer for comfort and support for 3-5 days as needed.  Patient advised to follow-up with treating orthopedics if no improvement or worsening of complaint for the next week.      ____________________________________________   FINAL CLINICAL IMPRESSION(S) / ED DIAGNOSES  Final diagnoses:  Knee effusion, left  Abrasion of left leg, initial encounter     ED Discharge Orders        Ordered    traMADol (ULTRAM) 50 MG tablet  Every 12 hours PRN     11/23/17 1526     naproxen (NAPROSYN) 375 MG tablet  2 times daily with meals     11/23/17 1526       Note:  This document was prepared using Dragon voice recognition software and may include unintentional dictation errors.    Tamala Julian,  Hinda Lenis, PA-C 11/23/17 1534    Lisa Roca, MD 11/24/17 [phone]

## 2017-11-24 DIAGNOSIS — M25462 Effusion, left knee: Secondary | ICD-10-CM | POA: Diagnosis not present

## 2017-11-24 DIAGNOSIS — M25512 Pain in left shoulder: Secondary | ICD-10-CM | POA: Diagnosis not present

## 2017-11-24 DIAGNOSIS — M25511 Pain in right shoulder: Secondary | ICD-10-CM | POA: Diagnosis not present

## 2017-11-24 DIAGNOSIS — M12812 Other specific arthropathies, not elsewhere classified, left shoulder: Secondary | ICD-10-CM | POA: Diagnosis not present

## 2017-11-24 DIAGNOSIS — M12811 Other specific arthropathies, not elsewhere classified, right shoulder: Secondary | ICD-10-CM | POA: Diagnosis not present

## 2017-11-24 DIAGNOSIS — M1712 Unilateral primary osteoarthritis, left knee: Secondary | ICD-10-CM | POA: Diagnosis not present

## 2017-12-01 DIAGNOSIS — B351 Tinea unguium: Secondary | ICD-10-CM | POA: Diagnosis not present

## 2017-12-01 DIAGNOSIS — M79675 Pain in left toe(s): Secondary | ICD-10-CM | POA: Diagnosis not present

## 2017-12-01 DIAGNOSIS — M79674 Pain in right toe(s): Secondary | ICD-10-CM | POA: Diagnosis not present

## 2017-12-05 DIAGNOSIS — M12812 Other specific arthropathies, not elsewhere classified, left shoulder: Secondary | ICD-10-CM | POA: Diagnosis not present

## 2017-12-05 DIAGNOSIS — M12811 Other specific arthropathies, not elsewhere classified, right shoulder: Secondary | ICD-10-CM | POA: Diagnosis not present

## 2017-12-18 IMAGING — CR DG KNEE 1-2V PORT*R*
1 series · 2 of 2 positions shown · non-contrast
Comparison: None.

CLINICAL DATA: Right knee replacement

EXAM:
PORTABLE RIGHT KNEE - 1-2 VIEW

[Series 1: ap · 0.17mm/px · 2 of 2 slices shown]
[im 1/2]
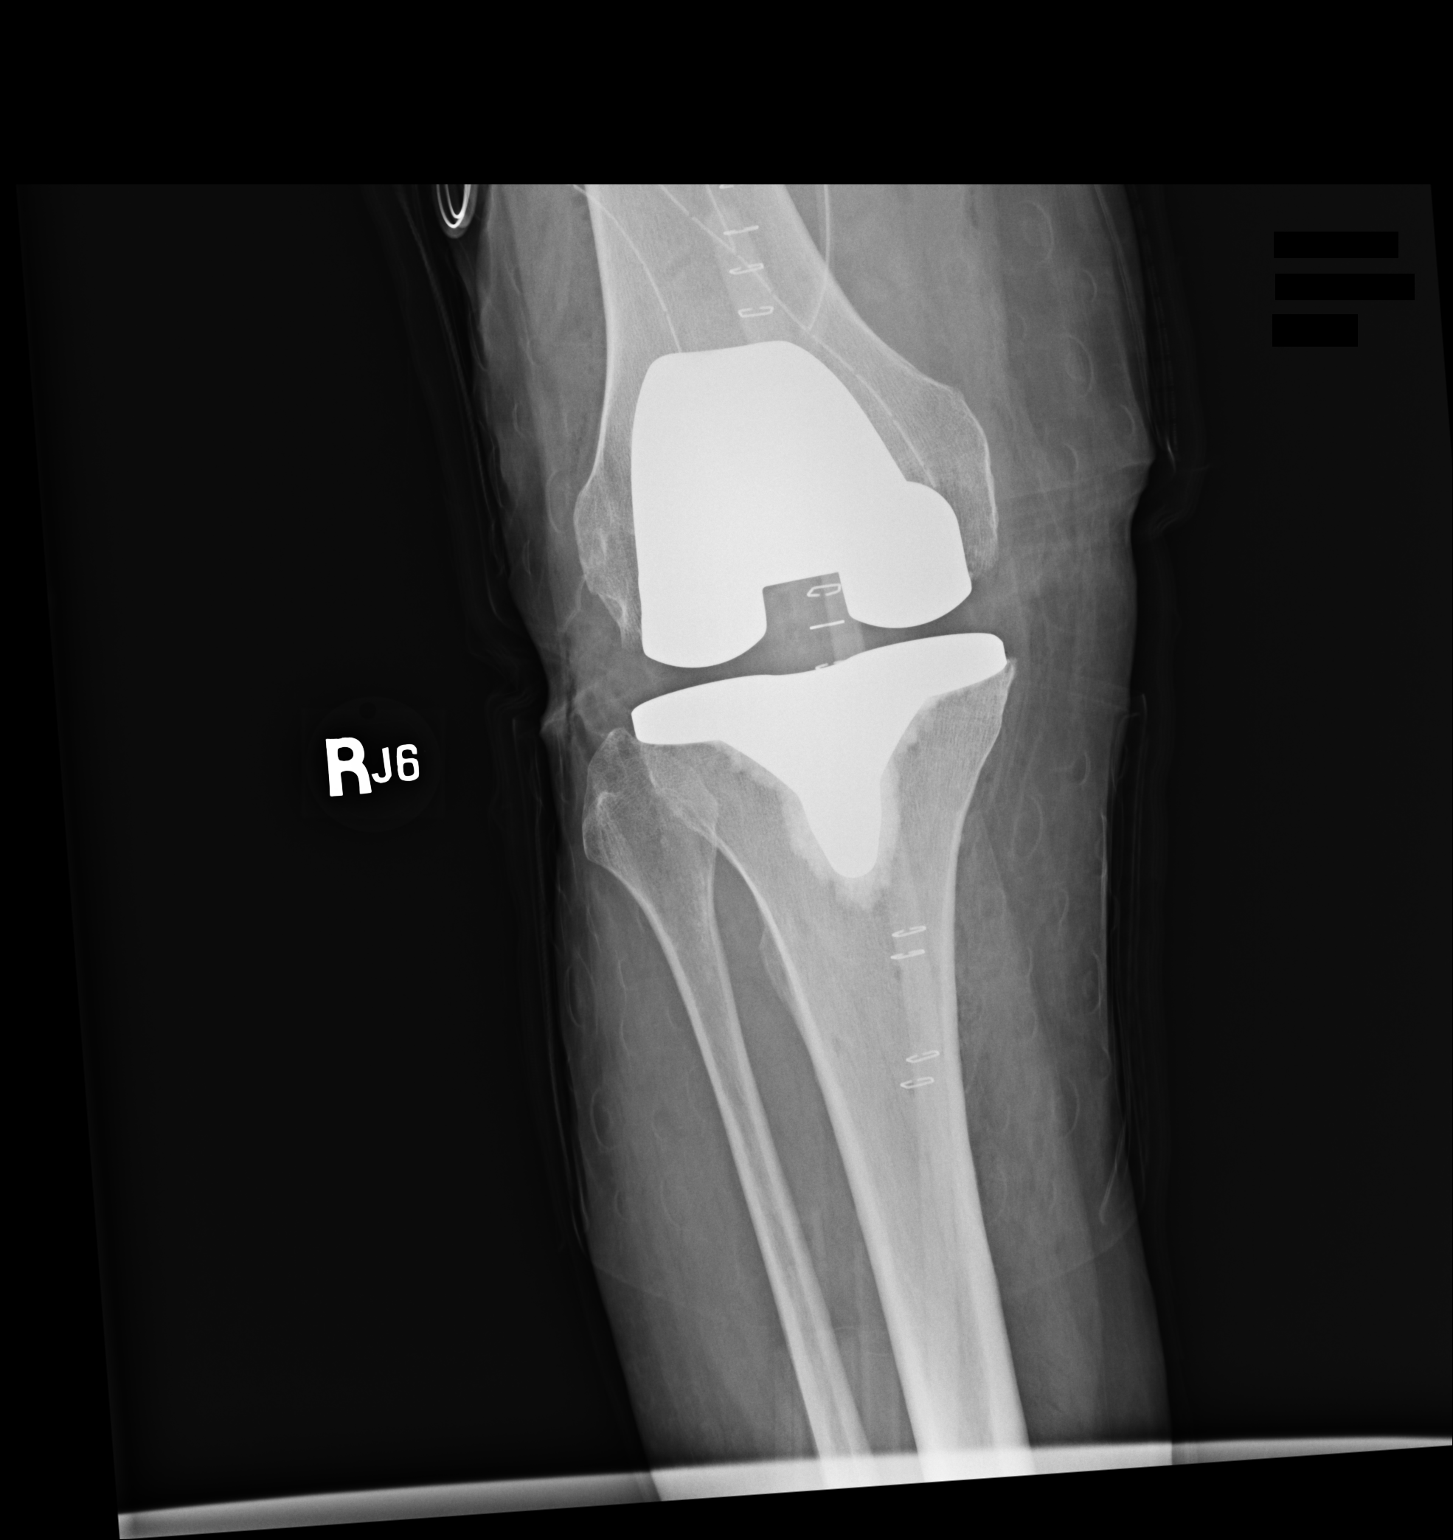
[im 2/2]
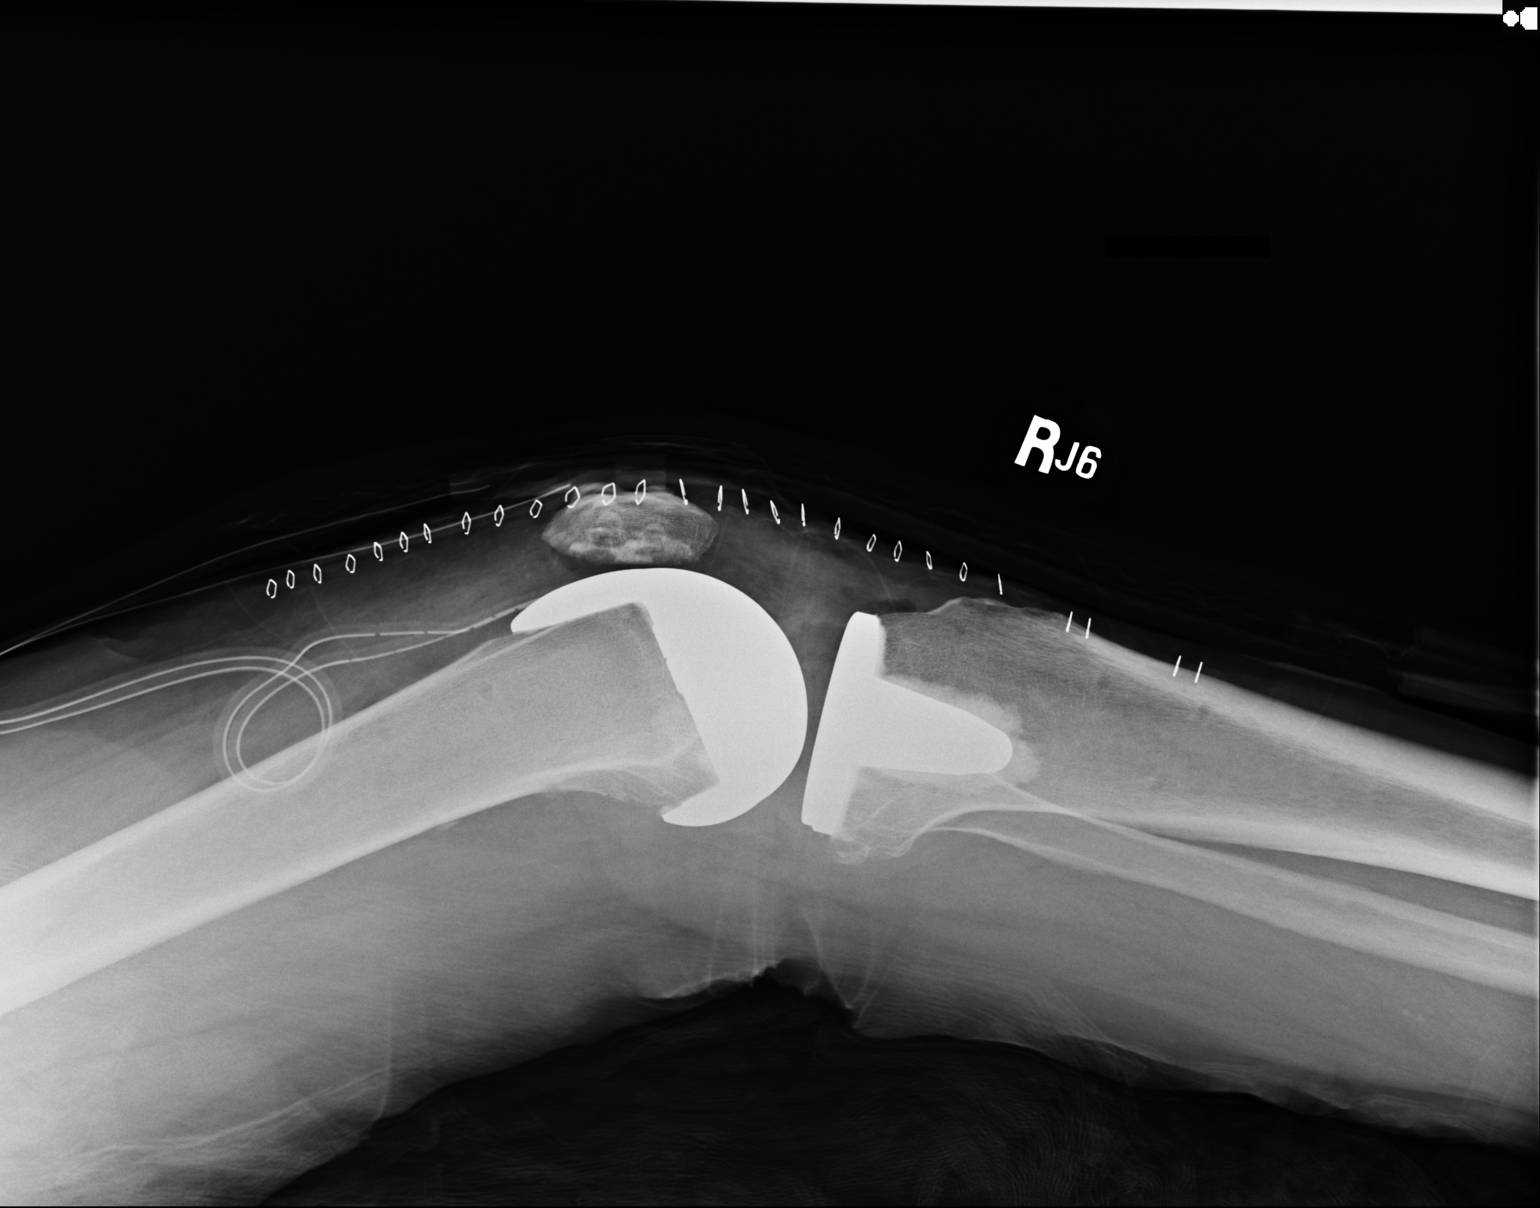

[2 of 2 positions shown; findings below may reference images not displayed]

FINDINGS: The right knee demonstrates a total knee arthroplasty without
evidence of hardware failure complication. There is no significant
joint effusion. There is no fracture or dislocation. The alignment
is anatomic. Surgical drains are present. Post-surgical changes
noted in the surrounding soft tissues.
IMPRESSION: Right total knee arthroplasty.

## 2017-12-21 DIAGNOSIS — W132XXA Fall from, out of or through roof, initial encounter: Secondary | ICD-10-CM | POA: Diagnosis not present

## 2017-12-21 DIAGNOSIS — S70312A Abrasion, left thigh, initial encounter: Secondary | ICD-10-CM | POA: Diagnosis not present

## 2018-01-26 DIAGNOSIS — M7050 Other bursitis of knee, unspecified knee: Secondary | ICD-10-CM | POA: Diagnosis not present

## 2018-01-26 DIAGNOSIS — M7052 Other bursitis of knee, left knee: Secondary | ICD-10-CM | POA: Diagnosis not present

## 2018-01-26 DIAGNOSIS — M1712 Unilateral primary osteoarthritis, left knee: Secondary | ICD-10-CM | POA: Diagnosis not present

## 2018-02-28 DIAGNOSIS — M779 Enthesopathy, unspecified: Secondary | ICD-10-CM | POA: Diagnosis not present

## 2018-02-28 DIAGNOSIS — M2580 Other specified joint disorders, unspecified joint: Secondary | ICD-10-CM | POA: Diagnosis not present

## 2018-02-28 DIAGNOSIS — Q6689 Other  specified congenital deformities of feet: Secondary | ICD-10-CM | POA: Diagnosis not present

## 2018-04-04 DIAGNOSIS — H40003 Preglaucoma, unspecified, bilateral: Secondary | ICD-10-CM | POA: Diagnosis not present

## 2018-04-04 DIAGNOSIS — H353131 Nonexudative age-related macular degeneration, bilateral, early dry stage: Secondary | ICD-10-CM | POA: Diagnosis not present

## 2018-04-07 DIAGNOSIS — M47816 Spondylosis without myelopathy or radiculopathy, lumbar region: Secondary | ICD-10-CM | POA: Diagnosis not present

## 2018-04-07 DIAGNOSIS — M7581 Other shoulder lesions, right shoulder: Secondary | ICD-10-CM | POA: Diagnosis not present

## 2018-04-25 DIAGNOSIS — R7309 Other abnormal glucose: Secondary | ICD-10-CM | POA: Diagnosis not present

## 2018-04-25 DIAGNOSIS — E7800 Pure hypercholesterolemia, unspecified: Secondary | ICD-10-CM | POA: Diagnosis not present

## 2018-04-25 DIAGNOSIS — Z79899 Other long term (current) drug therapy: Secondary | ICD-10-CM | POA: Diagnosis not present

## 2018-04-26 DIAGNOSIS — D692 Other nonthrombocytopenic purpura: Secondary | ICD-10-CM | POA: Diagnosis not present

## 2018-04-26 DIAGNOSIS — L720 Epidermal cyst: Secondary | ICD-10-CM | POA: Diagnosis not present

## 2018-04-26 DIAGNOSIS — L820 Inflamed seborrheic keratosis: Secondary | ICD-10-CM | POA: Diagnosis not present

## 2018-04-26 DIAGNOSIS — B353 Tinea pedis: Secondary | ICD-10-CM | POA: Diagnosis not present

## 2018-04-26 DIAGNOSIS — Z1283 Encounter for screening for malignant neoplasm of skin: Secondary | ICD-10-CM | POA: Diagnosis not present

## 2018-04-26 DIAGNOSIS — Z85828 Personal history of other malignant neoplasm of skin: Secondary | ICD-10-CM | POA: Diagnosis not present

## 2018-04-26 DIAGNOSIS — D485 Neoplasm of uncertain behavior of skin: Secondary | ICD-10-CM | POA: Diagnosis not present

## 2018-04-26 DIAGNOSIS — L570 Actinic keratosis: Secondary | ICD-10-CM | POA: Diagnosis not present

## 2018-04-26 DIAGNOSIS — L821 Other seborrheic keratosis: Secondary | ICD-10-CM | POA: Diagnosis not present

## 2018-04-28 DIAGNOSIS — Z125 Encounter for screening for malignant neoplasm of prostate: Secondary | ICD-10-CM | POA: Diagnosis not present

## 2018-04-28 DIAGNOSIS — D649 Anemia, unspecified: Secondary | ICD-10-CM | POA: Diagnosis not present

## 2018-04-28 DIAGNOSIS — Z0000 Encounter for general adult medical examination without abnormal findings: Secondary | ICD-10-CM | POA: Diagnosis not present

## 2018-04-28 DIAGNOSIS — E7800 Pure hypercholesterolemia, unspecified: Secondary | ICD-10-CM | POA: Diagnosis not present

## 2018-04-28 DIAGNOSIS — I10 Essential (primary) hypertension: Secondary | ICD-10-CM | POA: Diagnosis not present

## 2018-04-28 DIAGNOSIS — R7309 Other abnormal glucose: Secondary | ICD-10-CM | POA: Diagnosis not present

## 2018-04-28 DIAGNOSIS — Z79899 Other long term (current) drug therapy: Secondary | ICD-10-CM | POA: Diagnosis not present

## 2018-05-08 DIAGNOSIS — R208 Other disturbances of skin sensation: Secondary | ICD-10-CM | POA: Diagnosis not present

## 2018-05-08 DIAGNOSIS — L820 Inflamed seborrheic keratosis: Secondary | ICD-10-CM | POA: Diagnosis not present

## 2018-05-08 DIAGNOSIS — R234 Changes in skin texture: Secondary | ICD-10-CM | POA: Diagnosis not present

## 2018-06-12 DIAGNOSIS — D692 Other nonthrombocytopenic purpura: Secondary | ICD-10-CM | POA: Diagnosis not present

## 2018-06-12 DIAGNOSIS — L578 Other skin changes due to chronic exposure to nonionizing radiation: Secondary | ICD-10-CM | POA: Diagnosis not present

## 2018-06-12 DIAGNOSIS — B353 Tinea pedis: Secondary | ICD-10-CM | POA: Diagnosis not present

## 2018-06-12 DIAGNOSIS — D225 Melanocytic nevi of trunk: Secondary | ICD-10-CM | POA: Diagnosis not present

## 2018-06-12 DIAGNOSIS — B351 Tinea unguium: Secondary | ICD-10-CM | POA: Diagnosis not present

## 2018-06-12 DIAGNOSIS — L570 Actinic keratosis: Secondary | ICD-10-CM | POA: Diagnosis not present

## 2018-06-12 DIAGNOSIS — D2260 Melanocytic nevi of unspecified upper limb, including shoulder: Secondary | ICD-10-CM | POA: Diagnosis not present

## 2018-06-12 DIAGNOSIS — L821 Other seborrheic keratosis: Secondary | ICD-10-CM | POA: Diagnosis not present

## 2018-07-05 DIAGNOSIS — B351 Tinea unguium: Secondary | ICD-10-CM | POA: Diagnosis not present

## 2018-07-05 DIAGNOSIS — M79674 Pain in right toe(s): Secondary | ICD-10-CM | POA: Diagnosis not present

## 2018-07-05 DIAGNOSIS — M79675 Pain in left toe(s): Secondary | ICD-10-CM | POA: Diagnosis not present

## 2018-07-07 ENCOUNTER — Other Ambulatory Visit
Admission: RE | Admit: 2018-07-07 | Discharge: 2018-07-07 | Disposition: A | Discharge status: Home / Self Care | Payer: Medicare HMO | Source: Ambulatory Visit | Attending: Student | Admitting: Student

## 2018-07-07 DIAGNOSIS — R52 Pain, unspecified: Secondary | ICD-10-CM | POA: Diagnosis not present

## 2018-07-07 DIAGNOSIS — M25562 Pain in left knee: Secondary | ICD-10-CM | POA: Diagnosis not present

## 2018-07-07 DIAGNOSIS — M1712 Unilateral primary osteoarthritis, left knee: Secondary | ICD-10-CM | POA: Diagnosis not present

## 2018-07-07 LAB — SYNOVIAL FLUID, CRYSTAL: Crystals, Fluid: NONE SEEN

## 2018-09-15 DIAGNOSIS — H40003 Preglaucoma, unspecified, bilateral: Secondary | ICD-10-CM | POA: Diagnosis not present

## 2018-09-22 DIAGNOSIS — H40003 Preglaucoma, unspecified, bilateral: Secondary | ICD-10-CM | POA: Diagnosis not present

## 2019-03-08 DIAGNOSIS — M47816 Spondylosis without myelopathy or radiculopathy, lumbar region: Secondary | ICD-10-CM | POA: Diagnosis not present

## 2019-03-08 DIAGNOSIS — M25551 Pain in right hip: Secondary | ICD-10-CM | POA: Diagnosis not present

## 2019-03-08 DIAGNOSIS — M533 Sacrococcygeal disorders, not elsewhere classified: Secondary | ICD-10-CM | POA: Diagnosis not present

## 2019-04-09 DIAGNOSIS — B351 Tinea unguium: Secondary | ICD-10-CM | POA: Diagnosis not present

## 2019-04-09 DIAGNOSIS — M79674 Pain in right toe(s): Secondary | ICD-10-CM | POA: Diagnosis not present

## 2019-04-09 DIAGNOSIS — M79675 Pain in left toe(s): Secondary | ICD-10-CM | POA: Diagnosis not present

## 2019-04-19 ENCOUNTER — Other Ambulatory Visit: Payer: Self-pay | Admitting: Student

## 2019-04-19 DIAGNOSIS — M778 Other enthesopathies, not elsewhere classified: Secondary | ICD-10-CM

## 2019-04-23 ENCOUNTER — Ambulatory Visit: Payer: Medicare HMO

## 2019-04-26 DIAGNOSIS — H40003 Preglaucoma, unspecified, bilateral: Secondary | ICD-10-CM | POA: Diagnosis not present

## 2019-04-26 DIAGNOSIS — H353132 Nonexudative age-related macular degeneration, bilateral, intermediate dry stage: Secondary | ICD-10-CM | POA: Diagnosis not present

## 2019-04-29 ENCOUNTER — Ambulatory Visit: Payer: Medicare HMO

## 2019-05-02 DIAGNOSIS — M75112 Incomplete rotator cuff tear or rupture of left shoulder, not specified as traumatic: Secondary | ICD-10-CM | POA: Diagnosis not present

## 2019-05-02 DIAGNOSIS — S46812A Strain of other muscles, fascia and tendons at shoulder and upper arm level, left arm, initial encounter: Secondary | ICD-10-CM | POA: Diagnosis not present

## 2019-05-02 DIAGNOSIS — D2230 Melanocytic nevi of unspecified part of face: Secondary | ICD-10-CM | POA: Diagnosis not present

## 2019-05-02 DIAGNOSIS — M7582 Other shoulder lesions, left shoulder: Secondary | ICD-10-CM | POA: Diagnosis not present

## 2019-05-02 DIAGNOSIS — L820 Inflamed seborrheic keratosis: Secondary | ICD-10-CM | POA: Diagnosis not present

## 2019-05-02 DIAGNOSIS — L570 Actinic keratosis: Secondary | ICD-10-CM | POA: Diagnosis not present

## 2019-05-02 DIAGNOSIS — M75121 Complete rotator cuff tear or rupture of right shoulder, not specified as traumatic: Secondary | ICD-10-CM | POA: Diagnosis not present

## 2019-05-02 DIAGNOSIS — D692 Other nonthrombocytopenic purpura: Secondary | ICD-10-CM | POA: Diagnosis not present

## 2019-05-02 DIAGNOSIS — M12812 Other specific arthropathies, not elsewhere classified, left shoulder: Secondary | ICD-10-CM | POA: Diagnosis not present

## 2019-05-02 DIAGNOSIS — L821 Other seborrheic keratosis: Secondary | ICD-10-CM | POA: Diagnosis not present

## 2019-05-02 DIAGNOSIS — M7581 Other shoulder lesions, right shoulder: Secondary | ICD-10-CM | POA: Diagnosis not present

## 2019-05-02 DIAGNOSIS — S46811A Strain of other muscles, fascia and tendons at shoulder and upper arm level, right arm, initial encounter: Secondary | ICD-10-CM | POA: Diagnosis not present

## 2019-05-02 DIAGNOSIS — D225 Melanocytic nevi of trunk: Secondary | ICD-10-CM | POA: Diagnosis not present

## 2019-05-02 DIAGNOSIS — Z1283 Encounter for screening for malignant neoplasm of skin: Secondary | ICD-10-CM | POA: Diagnosis not present

## 2019-05-02 DIAGNOSIS — D1800 Hemangioma unspecified site: Secondary | ICD-10-CM | POA: Diagnosis not present

## 2019-05-02 DIAGNOSIS — L720 Epidermal cyst: Secondary | ICD-10-CM | POA: Diagnosis not present

## 2019-05-17 DIAGNOSIS — R7309 Other abnormal glucose: Secondary | ICD-10-CM | POA: Diagnosis not present

## 2019-05-17 DIAGNOSIS — Z79899 Other long term (current) drug therapy: Secondary | ICD-10-CM | POA: Diagnosis not present

## 2019-05-17 DIAGNOSIS — E7800 Pure hypercholesterolemia, unspecified: Secondary | ICD-10-CM | POA: Diagnosis not present

## 2019-05-21 DIAGNOSIS — M7582 Other shoulder lesions, left shoulder: Secondary | ICD-10-CM | POA: Diagnosis not present

## 2019-05-21 DIAGNOSIS — M75121 Complete rotator cuff tear or rupture of right shoulder, not specified as traumatic: Secondary | ICD-10-CM | POA: Diagnosis not present

## 2019-05-21 DIAGNOSIS — M1712 Unilateral primary osteoarthritis, left knee: Secondary | ICD-10-CM | POA: Diagnosis not present

## 2019-05-24 DIAGNOSIS — E7800 Pure hypercholesterolemia, unspecified: Secondary | ICD-10-CM | POA: Diagnosis not present

## 2019-05-24 DIAGNOSIS — Z79899 Other long term (current) drug therapy: Secondary | ICD-10-CM | POA: Diagnosis not present

## 2019-05-24 DIAGNOSIS — Z1211 Encounter for screening for malignant neoplasm of colon: Secondary | ICD-10-CM | POA: Diagnosis not present

## 2019-05-24 DIAGNOSIS — Z125 Encounter for screening for malignant neoplasm of prostate: Secondary | ICD-10-CM | POA: Diagnosis not present

## 2019-05-24 DIAGNOSIS — R079 Chest pain, unspecified: Secondary | ICD-10-CM | POA: Diagnosis not present

## 2019-05-24 DIAGNOSIS — R0600 Dyspnea, unspecified: Secondary | ICD-10-CM | POA: Diagnosis not present

## 2019-05-24 DIAGNOSIS — R7309 Other abnormal glucose: Secondary | ICD-10-CM | POA: Diagnosis not present

## 2019-05-24 DIAGNOSIS — I10 Essential (primary) hypertension: Secondary | ICD-10-CM | POA: Diagnosis not present

## 2019-05-24 DIAGNOSIS — Z0000 Encounter for general adult medical examination without abnormal findings: Secondary | ICD-10-CM | POA: Diagnosis not present

## 2019-05-30 DIAGNOSIS — Z1211 Encounter for screening for malignant neoplasm of colon: Secondary | ICD-10-CM | POA: Diagnosis not present

## 2019-05-31 IMAGING — MR MR CERVICAL SPINE W/O CM
5 series · 33 of 48 positions shown · non-contrast
Comparison: None.

CLINICAL DATA: Neck stiffness, posterior neck pain radiating into
both shoulder for 4 months. No acute injury or prior relevant
surgery.

EXAM:
MRI CERVICAL SPINE WITHOUT CONTRAST
TECHNIQUE: Multiplanar, multisequence MR imaging of the cervical spine was
performed. No intravenous contrast was administered.

[Series 3: T2 · sagittal · 3.0mm · 0.70mm/px · 6 of 15 slices shown (1 of 2)]
[im 1/15]
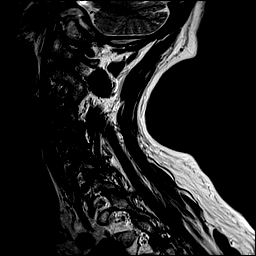
[im 3/15]
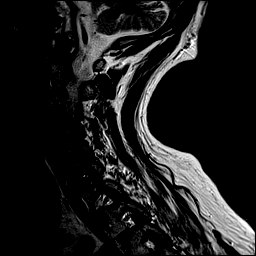
[im 6/15]
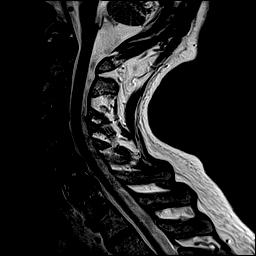
[im 9/15]
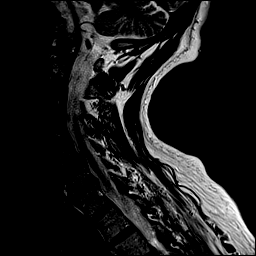
[im 12/15]
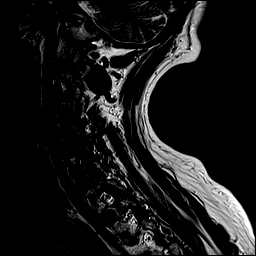
[im 15/15]
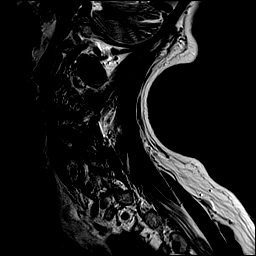

[Series 4: T1 · sagittal · 3.0mm · 0.70mm/px · 7 of 15 slices shown]
[im 1/15]
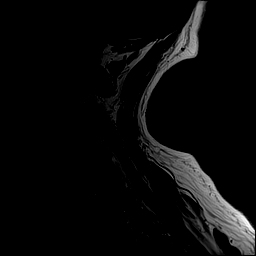
[im 3/15]
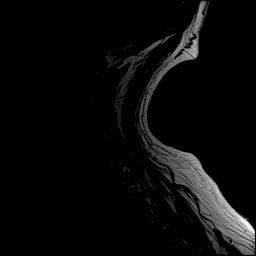
[im 5/15]
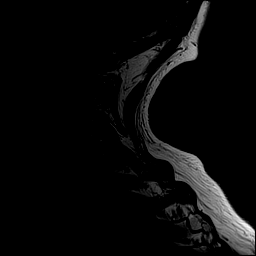
[im 8/15]
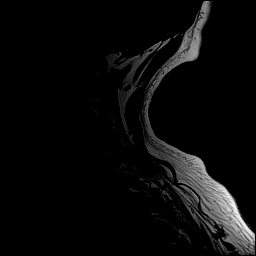
[im 10/15]
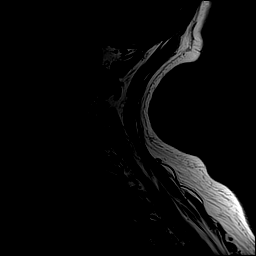
[im 12/15]
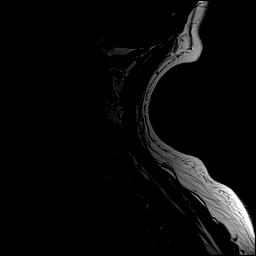
[im 15/15]
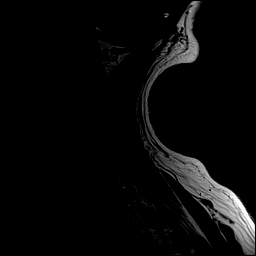

[Series 5: STIR · sagittal · 3.0mm · 0.35mm/px · 7 of 15 slices shown]
[im 1/15]
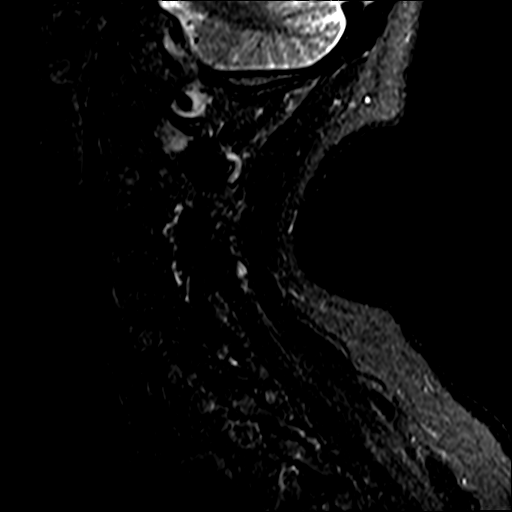
[im 3/15]
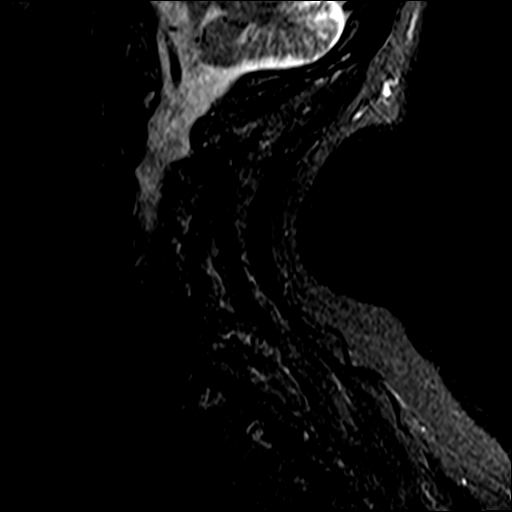
[im 5/15]
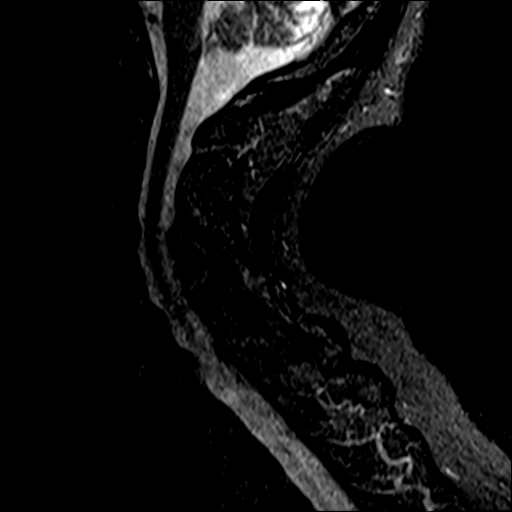
[im 8/15]
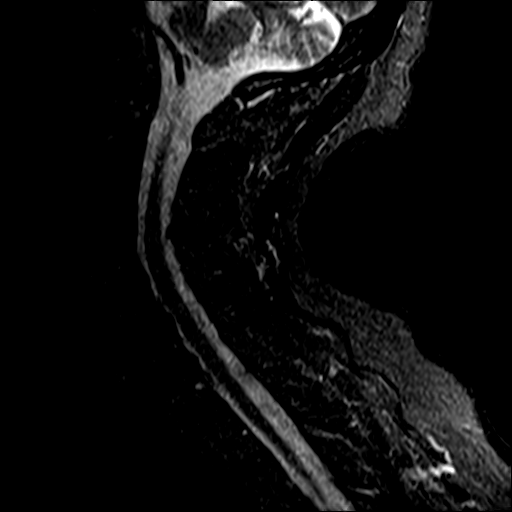
[im 10/15]
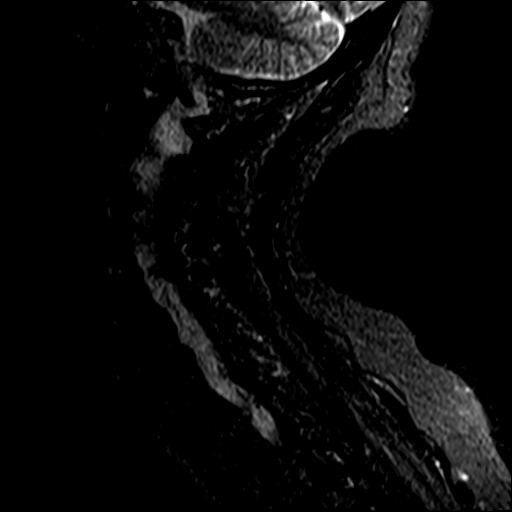
[im 12/15]
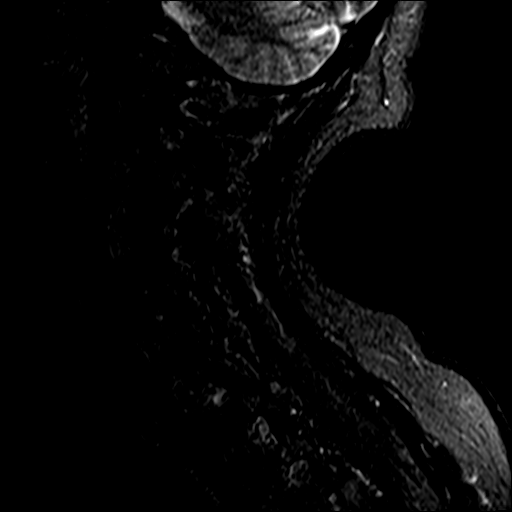
[im 15/15]
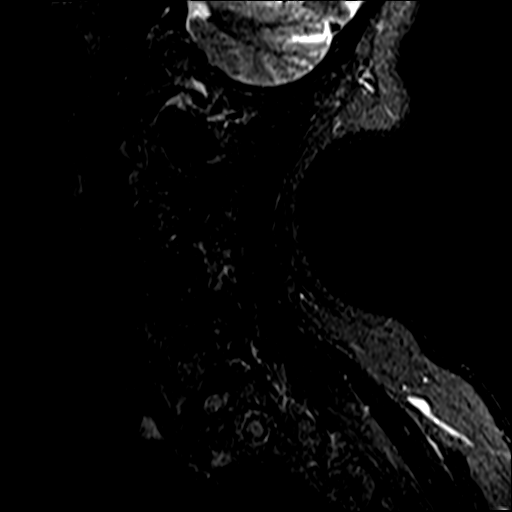

[Series 6: T2 · axial · 3.0mm · 0.70mm/px · z∈[-100,+2]mm · 8 of 29 slices shown (2 of 2)]
[im 1/29]
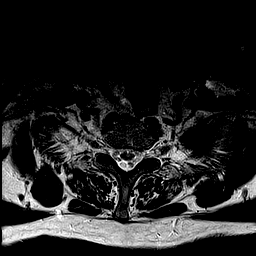
[im 5/29]
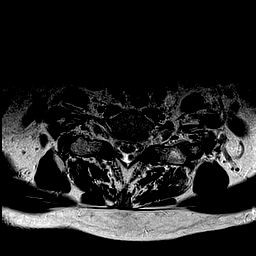
[im 9/29]
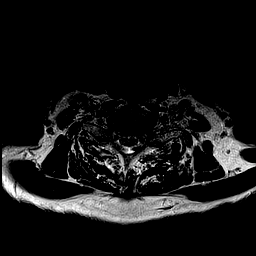
[im 13/29]
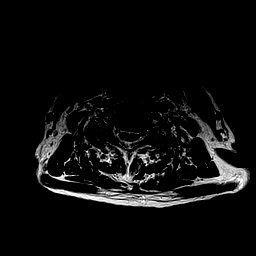
[im 16/29]
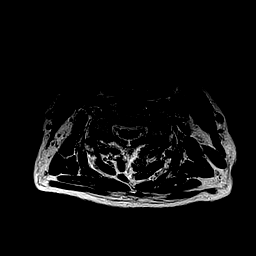
[im 20/29]
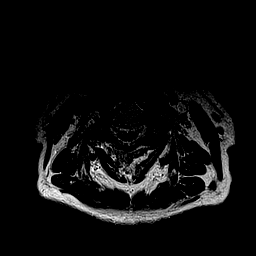
[im 24/29]
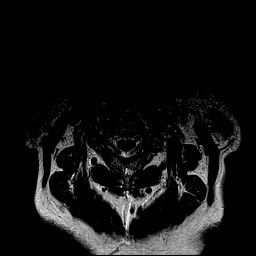
[im 29/29]
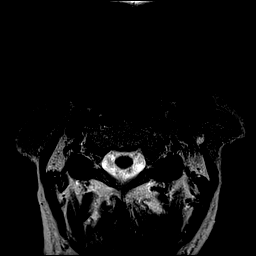

[Series 7: mpgr ax · axial · 3.0mm · 0.35mm/px · z∈[-100,-45]mm · 5 of 29 slices shown]
[im 1/29]
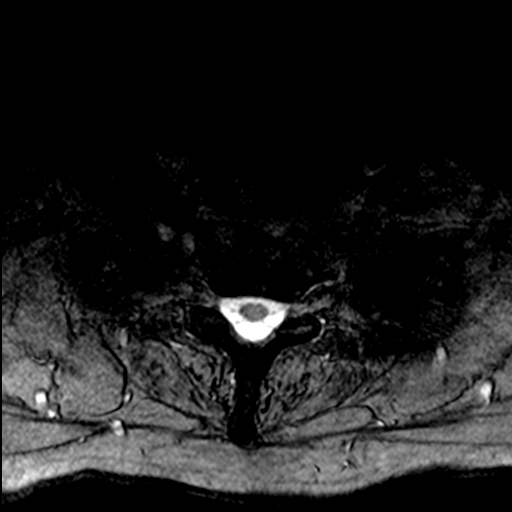
[im 5/29]
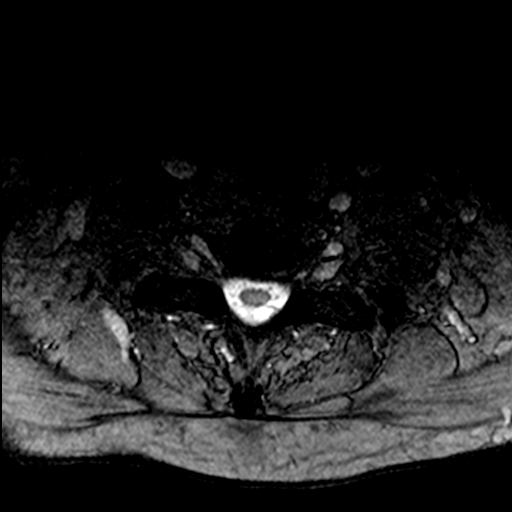
[im 9/29]
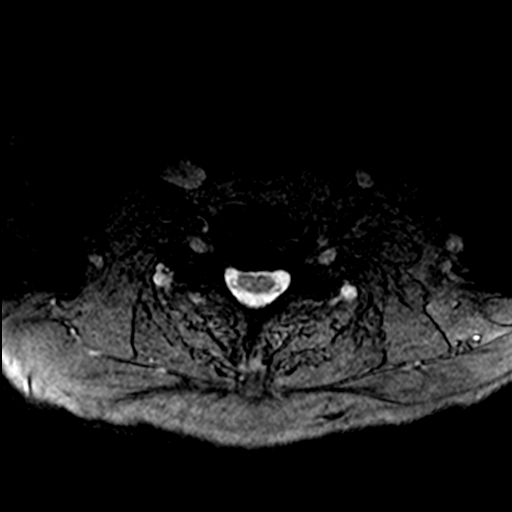
[im 13/29]
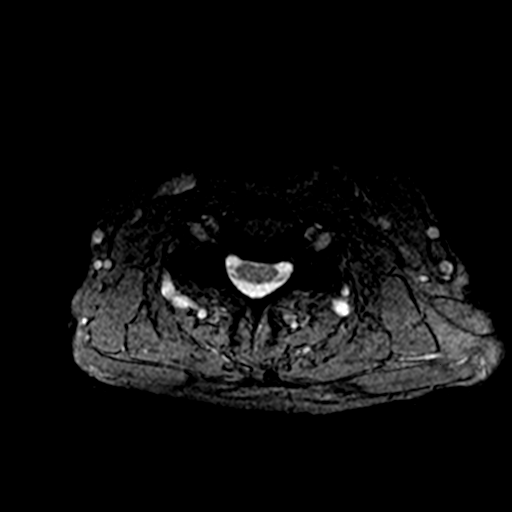
[im 16/29]
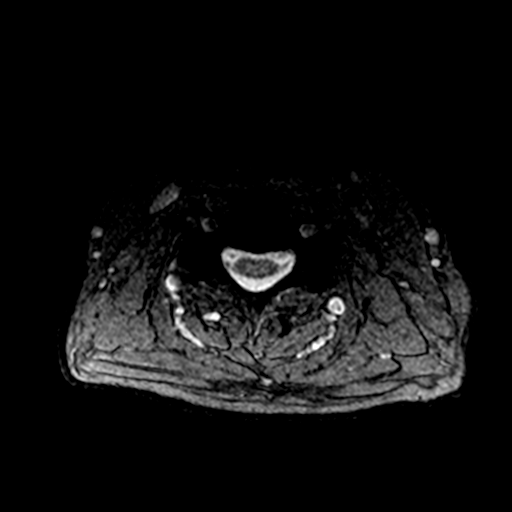

[33 of 48 positions shown; findings below may reference images not displayed]

FINDINGS: Alignment: Normal.

Vertebrae: No acute or suspicious osseous findings. There are
scattered endplate degenerative changes, greatest at C4-5 and C6-7.

Cord: Normal in signal and caliber.

Posterior Fossa, vertebral arteries, paraspinal tissues: Visualized
portions of the posterior fossa and paraspinal soft tissues appear
unremarkable. Bilateral vertebral artery flow voids.

Disc levels:

C2-3:  The left facet joint appears ankylosed.  Otherwise normal.

C3-4: Asymmetric uncinate spurring on the right with moderate
right-greater-than-left facet hypertrophy. There is resulting
moderate right and mild left foraminal narrowing. No cord deformity.

C4-5: Mild uncinate spurring and moderate facet hypertrophy
bilaterally. Mild foraminal narrowing bilaterally. No cord
deformity.

C5-6: Mild disc bulging with mild uncinate spurring and facet
hypertrophy bilaterally. Mild foraminal narrowing bilaterally. No
cord deformity.

C6-7: Spondylosis with posterior osteophytes, uncinate spurring and
facet hypertrophy bilaterally. Mild foraminal narrowing bilaterally.
No cord deformity.

C7-T1: Mild facet hypertrophy. No spinal stenosis or nerve root
encroachment.
IMPRESSION: 1. No acute findings, cord deformity or abnormal cord signal.
2. Multilevel cervical spondylosis and facet hypertrophy as
described, contributing to mild-to-moderate foraminal narrowing at
several levels. This may contribute to exiting nerve root
encroachment.

## 2019-07-04 DIAGNOSIS — G5603 Carpal tunnel syndrome, bilateral upper limbs: Secondary | ICD-10-CM | POA: Diagnosis not present

## 2019-07-20 DIAGNOSIS — M25552 Pain in left hip: Secondary | ICD-10-CM | POA: Diagnosis not present

## 2019-07-20 DIAGNOSIS — M545 Low back pain: Secondary | ICD-10-CM | POA: Diagnosis not present

## 2019-07-23 DIAGNOSIS — M545 Low back pain: Secondary | ICD-10-CM | POA: Diagnosis not present

## 2019-07-23 DIAGNOSIS — M25552 Pain in left hip: Secondary | ICD-10-CM | POA: Diagnosis not present

## 2019-07-28 DIAGNOSIS — M25462 Effusion, left knee: Secondary | ICD-10-CM | POA: Diagnosis not present

## 2019-07-28 DIAGNOSIS — M25469 Effusion, unspecified knee: Secondary | ICD-10-CM | POA: Diagnosis not present

## 2019-07-28 DIAGNOSIS — M1712 Unilateral primary osteoarthritis, left knee: Secondary | ICD-10-CM | POA: Diagnosis not present

## 2019-07-28 DIAGNOSIS — M25562 Pain in left knee: Secondary | ICD-10-CM | POA: Diagnosis not present

## 2019-07-28 DIAGNOSIS — Z79899 Other long term (current) drug therapy: Secondary | ICD-10-CM | POA: Diagnosis not present

## 2019-09-06 DIAGNOSIS — B351 Tinea unguium: Secondary | ICD-10-CM | POA: Diagnosis not present

## 2019-09-06 DIAGNOSIS — M79674 Pain in right toe(s): Secondary | ICD-10-CM | POA: Diagnosis not present

## 2019-09-06 DIAGNOSIS — M79675 Pain in left toe(s): Secondary | ICD-10-CM | POA: Diagnosis not present

## 2019-09-06 DIAGNOSIS — M8949 Other hypertrophic osteoarthropathy, multiple sites: Secondary | ICD-10-CM | POA: Diagnosis not present

## 2019-09-06 DIAGNOSIS — M7989 Other specified soft tissue disorders: Secondary | ICD-10-CM | POA: Diagnosis not present

## 2019-09-10 DIAGNOSIS — M1712 Unilateral primary osteoarthritis, left knee: Secondary | ICD-10-CM | POA: Diagnosis not present

## 2019-09-10 DIAGNOSIS — H02403 Unspecified ptosis of bilateral eyelids: Secondary | ICD-10-CM | POA: Diagnosis not present

## 2019-09-10 DIAGNOSIS — M75121 Complete rotator cuff tear or rupture of right shoulder, not specified as traumatic: Secondary | ICD-10-CM | POA: Diagnosis not present

## 2019-09-12 DIAGNOSIS — Z79899 Other long term (current) drug therapy: Secondary | ICD-10-CM | POA: Diagnosis not present

## 2019-09-12 DIAGNOSIS — M1380 Other specified arthritis, unspecified site: Secondary | ICD-10-CM | POA: Diagnosis not present

## 2019-09-12 DIAGNOSIS — M8949 Other hypertrophic osteoarthropathy, multiple sites: Secondary | ICD-10-CM | POA: Diagnosis not present

## 2019-10-12 DIAGNOSIS — M1380 Other specified arthritis, unspecified site: Secondary | ICD-10-CM | POA: Diagnosis not present

## 2019-10-12 DIAGNOSIS — Z79899 Other long term (current) drug therapy: Secondary | ICD-10-CM | POA: Diagnosis not present

## 2019-10-16 DIAGNOSIS — H00014 Hordeolum externum left upper eyelid: Secondary | ICD-10-CM | POA: Diagnosis not present

## 2019-10-18 DIAGNOSIS — H401131 Primary open-angle glaucoma, bilateral, mild stage: Secondary | ICD-10-CM | POA: Diagnosis not present

## 2019-10-19 DIAGNOSIS — M1712 Unilateral primary osteoarthritis, left knee: Secondary | ICD-10-CM | POA: Diagnosis not present

## 2019-10-24 DIAGNOSIS — Z79899 Other long term (current) drug therapy: Secondary | ICD-10-CM | POA: Diagnosis not present

## 2019-10-24 DIAGNOSIS — M8949 Other hypertrophic osteoarthropathy, multiple sites: Secondary | ICD-10-CM | POA: Diagnosis not present

## 2019-10-24 DIAGNOSIS — M1380 Other specified arthritis, unspecified site: Secondary | ICD-10-CM | POA: Diagnosis not present

## 2019-10-26 DIAGNOSIS — M1712 Unilateral primary osteoarthritis, left knee: Secondary | ICD-10-CM | POA: Diagnosis not present

## 2019-11-01 DIAGNOSIS — Z96651 Presence of right artificial knee joint: Secondary | ICD-10-CM | POA: Diagnosis not present

## 2019-11-01 DIAGNOSIS — M170 Bilateral primary osteoarthritis of knee: Secondary | ICD-10-CM | POA: Diagnosis not present

## 2019-11-02 DIAGNOSIS — M1712 Unilateral primary osteoarthritis, left knee: Secondary | ICD-10-CM | POA: Diagnosis not present

## 2019-11-12 DIAGNOSIS — M1380 Other specified arthritis, unspecified site: Secondary | ICD-10-CM | POA: Diagnosis not present

## 2019-11-12 DIAGNOSIS — Z79899 Other long term (current) drug therapy: Secondary | ICD-10-CM | POA: Diagnosis not present

## 2019-12-12 DIAGNOSIS — Z125 Encounter for screening for malignant neoplasm of prostate: Secondary | ICD-10-CM | POA: Diagnosis not present

## 2019-12-12 DIAGNOSIS — Z79899 Other long term (current) drug therapy: Secondary | ICD-10-CM | POA: Diagnosis not present

## 2019-12-12 DIAGNOSIS — R7309 Other abnormal glucose: Secondary | ICD-10-CM | POA: Diagnosis not present

## 2019-12-12 DIAGNOSIS — E7800 Pure hypercholesterolemia, unspecified: Secondary | ICD-10-CM | POA: Diagnosis not present

## 2019-12-12 DIAGNOSIS — M75121 Complete rotator cuff tear or rupture of right shoulder, not specified as traumatic: Secondary | ICD-10-CM | POA: Diagnosis not present

## 2019-12-12 DIAGNOSIS — I10 Essential (primary) hypertension: Secondary | ICD-10-CM | POA: Diagnosis not present

## 2019-12-25 DIAGNOSIS — Z79899 Other long term (current) drug therapy: Secondary | ICD-10-CM | POA: Diagnosis not present

## 2019-12-25 DIAGNOSIS — M8949 Other hypertrophic osteoarthropathy, multiple sites: Secondary | ICD-10-CM | POA: Diagnosis not present

## 2019-12-25 DIAGNOSIS — M1380 Other specified arthritis, unspecified site: Secondary | ICD-10-CM | POA: Diagnosis not present

## 2019-12-26 DIAGNOSIS — L600 Ingrowing nail: Secondary | ICD-10-CM | POA: Diagnosis not present

## 2019-12-26 DIAGNOSIS — M79675 Pain in left toe(s): Secondary | ICD-10-CM | POA: Diagnosis not present

## 2019-12-26 DIAGNOSIS — M79674 Pain in right toe(s): Secondary | ICD-10-CM | POA: Diagnosis not present

## 2019-12-26 DIAGNOSIS — M2580 Other specified joint disorders, unspecified joint: Secondary | ICD-10-CM | POA: Diagnosis not present

## 2019-12-26 DIAGNOSIS — B351 Tinea unguium: Secondary | ICD-10-CM | POA: Diagnosis not present

## 2020-01-08 DIAGNOSIS — S40021A Contusion of right upper arm, initial encounter: Secondary | ICD-10-CM | POA: Diagnosis not present

## 2020-01-08 DIAGNOSIS — S40022A Contusion of left upper arm, initial encounter: Secondary | ICD-10-CM | POA: Diagnosis not present

## 2020-01-15 DIAGNOSIS — M1712 Unilateral primary osteoarthritis, left knee: Secondary | ICD-10-CM | POA: Diagnosis not present

## 2020-02-22 DIAGNOSIS — R21 Rash and other nonspecific skin eruption: Secondary | ICD-10-CM | POA: Diagnosis not present

## 2020-02-22 DIAGNOSIS — M71339 Other bursal cyst, unspecified wrist: Secondary | ICD-10-CM | POA: Diagnosis not present

## 2020-02-22 DIAGNOSIS — H04123 Dry eye syndrome of bilateral lacrimal glands: Secondary | ICD-10-CM | POA: Diagnosis not present

## 2020-02-25 DIAGNOSIS — Z79899 Other long term (current) drug therapy: Secondary | ICD-10-CM | POA: Diagnosis not present

## 2020-02-25 DIAGNOSIS — M1380 Other specified arthritis, unspecified site: Secondary | ICD-10-CM | POA: Diagnosis not present

## 2020-03-07 DIAGNOSIS — Z0000 Encounter for general adult medical examination without abnormal findings: Secondary | ICD-10-CM | POA: Diagnosis not present

## 2020-03-07 DIAGNOSIS — E7800 Pure hypercholesterolemia, unspecified: Secondary | ICD-10-CM | POA: Diagnosis not present

## 2020-03-07 DIAGNOSIS — I10 Essential (primary) hypertension: Secondary | ICD-10-CM | POA: Diagnosis not present

## 2020-03-07 DIAGNOSIS — R7309 Other abnormal glucose: Secondary | ICD-10-CM | POA: Diagnosis not present

## 2020-03-07 DIAGNOSIS — Z79899 Other long term (current) drug therapy: Secondary | ICD-10-CM | POA: Diagnosis not present

## 2020-03-07 DIAGNOSIS — D649 Anemia, unspecified: Secondary | ICD-10-CM | POA: Diagnosis not present

## 2020-03-07 DIAGNOSIS — M1380 Other specified arthritis, unspecified site: Secondary | ICD-10-CM | POA: Diagnosis not present

## 2020-04-15 DIAGNOSIS — M778 Other enthesopathies, not elsewhere classified: Secondary | ICD-10-CM | POA: Diagnosis not present

## 2020-04-15 DIAGNOSIS — M79675 Pain in left toe(s): Secondary | ICD-10-CM | POA: Diagnosis not present

## 2020-04-15 DIAGNOSIS — B351 Tinea unguium: Secondary | ICD-10-CM | POA: Diagnosis not present

## 2020-04-15 DIAGNOSIS — M79674 Pain in right toe(s): Secondary | ICD-10-CM | POA: Diagnosis not present

## 2020-04-15 DIAGNOSIS — M2580 Other specified joint disorders, unspecified joint: Secondary | ICD-10-CM | POA: Diagnosis not present

## 2020-04-15 DIAGNOSIS — L600 Ingrowing nail: Secondary | ICD-10-CM | POA: Diagnosis not present

## 2020-04-16 ENCOUNTER — Ambulatory Visit: Payer: Medicare HMO | Admitting: Dermatology

## 2020-04-16 ENCOUNTER — Other Ambulatory Visit: Payer: Self-pay

## 2020-04-16 DIAGNOSIS — L570 Actinic keratosis: Secondary | ICD-10-CM

## 2020-04-16 DIAGNOSIS — L578 Other skin changes due to chronic exposure to nonionizing radiation: Secondary | ICD-10-CM | POA: Diagnosis not present

## 2020-04-16 DIAGNOSIS — L814 Other melanin hyperpigmentation: Secondary | ICD-10-CM

## 2020-04-16 DIAGNOSIS — D229 Melanocytic nevi, unspecified: Secondary | ICD-10-CM | POA: Diagnosis not present

## 2020-04-16 DIAGNOSIS — Z87828 Personal history of other (healed) physical injury and trauma: Secondary | ICD-10-CM

## 2020-04-16 DIAGNOSIS — D1800 Hemangioma unspecified site: Secondary | ICD-10-CM

## 2020-04-16 DIAGNOSIS — Z1283 Encounter for screening for malignant neoplasm of skin: Secondary | ICD-10-CM | POA: Diagnosis not present

## 2020-04-16 DIAGNOSIS — Z808 Family history of malignant neoplasm of other organs or systems: Secondary | ICD-10-CM

## 2020-04-16 DIAGNOSIS — L720 Epidermal cyst: Secondary | ICD-10-CM

## 2020-04-16 DIAGNOSIS — L821 Other seborrheic keratosis: Secondary | ICD-10-CM | POA: Diagnosis not present

## 2020-04-16 DIAGNOSIS — L738 Other specified follicular disorders: Secondary | ICD-10-CM | POA: Diagnosis not present

## 2020-04-16 DIAGNOSIS — T1490XA Injury, unspecified, initial encounter: Secondary | ICD-10-CM

## 2020-04-16 DIAGNOSIS — Z85828 Personal history of other malignant neoplasm of skin: Secondary | ICD-10-CM

## 2020-04-16 DIAGNOSIS — D692 Other nonthrombocytopenic purpura: Secondary | ICD-10-CM | POA: Diagnosis not present

## 2020-04-16 DIAGNOSIS — L820 Inflamed seborrheic keratosis: Secondary | ICD-10-CM | POA: Diagnosis not present

## 2020-04-16 NOTE — Progress Notes (Signed)
Follow-Up Visit   Subjective  Herbert Molina is a 79 y.o. male who presents for the following: Annual Exam (Total body skin exam), check spots (bil arms, scaly), and Cyst (R neck, itches). The patient presents for Upper Body Skin Exam (UBSE) for skin cancer screening and mole check.  The following portions of the chart were reviewed this encounter and updated as appropriate:  Tobacco  Allergies  Meds  Problems  Med Hx  Surg Hx  Fam Hx     Review of Systems:  No other skin or systemic complaints except as noted in HPI or Assessment and Plan.  Objective  Well appearing patient in no apparent distress; mood and affect are within normal limits.  All skin waist up examined.  Objective  scalp x 2, bil arms x 7 (9): Erythematous keratotic or waxy stuck-on papule or plaque.   Objective  mid forehead x 1, R sideburn x 1 (2): Pink scaly macules   Objective  L top of shoulder, R cheek, R axilla: Cystic paps  Objective  Posterior scalp: Bony prominence   Assessment & Plan    Purpura - Violaceous macules and patches - Benign - Related to age, sun damage and/or use of blood thinners - Observe - Can use OTC arnica containing moisturizer such as Dermend Bruise Formula if desired - Call for worsening or other concerns  Lentigines - Scattered tan macules - Discussed due to sun exposure - Benign, observe - Call for any changes  Seborrheic Keratoses - Stuck-on, waxy, tan-brown papules and plaques  - Discussed benign etiology and prognosis. - Observe - Call for any changes  Melanocytic Nevi - Tan-brown and/or pink-flesh-colored symmetric macules and papules - Benign appearing on exam today - Observation - Call clinic for new or changing moles - Recommend daily use of broad spectrum spf 30+ sunscreen to sun-exposed areas.   Hemangiomas - Red papules - Discussed benign nature - Observe - Call for any changes  Actinic Damage - diffuse scaly erythematous  macules with underlying dyspigmentation - Recommend daily broad spectrum sunscreen SPF 30+ to sun-exposed areas, reapply every 2 hours as needed.  - Call for new or changing lesions.  Skin cancer screening performed today.  Sebaceous Hyperplasia - Small yellow papules with a central dell - Benign - Observe  Inflamed seborrheic keratosis (9) scalp x 2, bil arms x 7  Destruction of lesion - scalp x 2, bil arms x 7 Complexity: simple   Destruction method: cryotherapy   Informed consent: discussed and consent obtained   Timeout:  patient name, date of birth, surgical site, and procedure verified Lesion destroyed using liquid nitrogen: Yes   Region frozen until ice ball extended beyond lesion: Yes   Outcome: patient tolerated procedure well with no complications   Post-procedure details: wound care instructions given    AK (actinic keratosis) (2) mid forehead x 1, R sideburn x 1  Destruction of lesion - mid forehead x 1, R sideburn x 1 Complexity: simple   Destruction method: cryotherapy   Informed consent: discussed and consent obtained   Timeout:  patient name, date of birth, surgical site, and procedure verified Lesion destroyed using liquid nitrogen: Yes   Region frozen until ice ball extended beyond lesion: Yes   Outcome: patient tolerated procedure well with no complications   Post-procedure details: wound care instructions given    Epidermal cyst L top of shoulder, R cheek, R axilla  Benign, observe  Traumatic injury Posterior scalp  Hx of Traumatic  Injury from yrs ago,   Pt c/o pain when sleeping, recommend pt discuss with PCP  Skin cancer screening  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  - Call if any new or changing lesions are noted between office  visits  Family History of Melanoma - Mother  Return in about 6 months (around 10/17/2020) for UBSE.   I, Othelia Pulling, RMA, am acting as scribe for Sarina Ser, MD .  Documentation: I have reviewed the above documentation for accuracy and completeness, and I agree with the above.  Sarina Ser, MD

## 2020-04-16 NOTE — Patient Instructions (Signed)
Cryotherapy Aftercare  . Wash gently with soap and water everyday.   . Apply Vaseline and Band-Aid daily until healed.  

## 2020-04-18 DIAGNOSIS — H353132 Nonexudative age-related macular degeneration, bilateral, intermediate dry stage: Secondary | ICD-10-CM | POA: Diagnosis not present

## 2020-04-20 ENCOUNTER — Encounter: Payer: Self-pay | Admitting: Dermatology

## 2020-05-09 DIAGNOSIS — M1712 Unilateral primary osteoarthritis, left knee: Secondary | ICD-10-CM | POA: Diagnosis not present

## 2020-05-09 DIAGNOSIS — M75121 Complete rotator cuff tear or rupture of right shoulder, not specified as traumatic: Secondary | ICD-10-CM | POA: Diagnosis not present

## 2020-05-09 DIAGNOSIS — M25462 Effusion, left knee: Secondary | ICD-10-CM | POA: Diagnosis not present

## 2020-05-15 ENCOUNTER — Other Ambulatory Visit: Payer: Self-pay

## 2020-05-15 ENCOUNTER — Ambulatory Visit: Payer: Medicare HMO | Admitting: Dermatology

## 2020-05-15 DIAGNOSIS — L820 Inflamed seborrheic keratosis: Secondary | ICD-10-CM

## 2020-05-15 DIAGNOSIS — L578 Other skin changes due to chronic exposure to nonionizing radiation: Secondary | ICD-10-CM

## 2020-05-15 DIAGNOSIS — D692 Other nonthrombocytopenic purpura: Secondary | ICD-10-CM | POA: Diagnosis not present

## 2020-05-15 DIAGNOSIS — L570 Actinic keratosis: Secondary | ICD-10-CM | POA: Diagnosis not present

## 2020-05-15 NOTE — Progress Notes (Signed)
   Follow-Up Visit   Subjective  Herbert Molina is a 79 y.o. male who presents for the following: Skin Problem (Irritated growth on the scalp x 1 month, tender and painful ).  The following portions of the chart were reviewed this encounter and updated as appropriate:  Tobacco  Allergies  Meds  Problems  Med Hx  Surg Hx  Fam Hx     Review of Systems:  No other skin or systemic complaints except as noted in HPI or Assessment and Plan.  Objective  Well appearing patient in no apparent distress; mood and affect are within normal limits.  A focused examination was performed including face,scalp,ears, arms . Relevant physical exam findings are noted in the Assessment and Plan.  Objective  Scalp,ears,face (18): Erythematous thin papules/macules with gritty scale.   Objective  Head - Anterior (Face), R forearm, L forearm (6): Erythematous keratotic or waxy stuck-on papule or plaque.    Assessment & Plan  AK (actinic keratosis) (18) Scalp,ears,face  Destruction of lesion - Scalp,ears,face Complexity: simple   Destruction method: cryotherapy   Informed consent: discussed and consent obtained   Timeout:  patient name, date of birth, surgical site, and procedure verified Lesion destroyed using liquid nitrogen: Yes   Region frozen until ice ball extended beyond lesion: Yes   Outcome: patient tolerated procedure well with no complications   Post-procedure details: wound care instructions given    Inflamed seborrheic keratosis (6) Head - Anterior (Face), R forearm, L forearm  Destruction of lesion - Head - Anterior (Face), R forearm, L forearm Complexity: simple   Destruction method: cryotherapy   Informed consent: discussed and consent obtained   Timeout:  patient name, date of birth, surgical site, and procedure verified Lesion destroyed using liquid nitrogen: Yes   Region frozen until ice ball extended beyond lesion: Yes   Outcome: patient tolerated procedure well with no  complications   Post-procedure details: wound care instructions given    Actinic Damage - diffuse scaly erythematous macules with underlying dyspigmentation - Recommend daily broad spectrum sunscreen SPF 30+ to sun-exposed areas, reapply every 2 hours as needed.  - Call for new or changing lesions. Purpura - Violaceous macules and patches - Benign - Related to age, sun damage and/or use of blood thinners - Observe - Can use OTC arnica containing moisturizer such as Dermend Bruise Formula if desired - Call for worsening or other concerns  Return if symptoms worsen or fail to improve, for as scheduled .  IMarye Round, CMA, am acting as scribe for Sarina Ser, MD .  Documentation: I have reviewed the above documentation for accuracy and completeness, and I agree with the above.  Sarina Ser, MD

## 2020-05-15 NOTE — Patient Instructions (Signed)
Cryotherapy Aftercare  . Wash gently with soap and water everyday.   . Apply Vaseline and Band-Aid daily until healed.  

## 2020-05-21 ENCOUNTER — Encounter: Payer: Self-pay | Admitting: Dermatology

## 2020-06-11 DIAGNOSIS — Z79899 Other long term (current) drug therapy: Secondary | ICD-10-CM | POA: Diagnosis not present

## 2020-06-11 DIAGNOSIS — R7309 Other abnormal glucose: Secondary | ICD-10-CM | POA: Diagnosis not present

## 2020-06-11 DIAGNOSIS — I10 Essential (primary) hypertension: Secondary | ICD-10-CM | POA: Diagnosis not present

## 2020-06-11 DIAGNOSIS — E7800 Pure hypercholesterolemia, unspecified: Secondary | ICD-10-CM | POA: Diagnosis not present

## 2020-06-20 DIAGNOSIS — Z79899 Other long term (current) drug therapy: Secondary | ICD-10-CM | POA: Diagnosis not present

## 2020-06-20 DIAGNOSIS — I10 Essential (primary) hypertension: Secondary | ICD-10-CM | POA: Diagnosis not present

## 2020-06-20 DIAGNOSIS — Z125 Encounter for screening for malignant neoplasm of prostate: Secondary | ICD-10-CM | POA: Diagnosis not present

## 2020-06-20 DIAGNOSIS — E7800 Pure hypercholesterolemia, unspecified: Secondary | ICD-10-CM | POA: Diagnosis not present

## 2020-06-26 DIAGNOSIS — M0600 Rheumatoid arthritis without rheumatoid factor, unspecified site: Secondary | ICD-10-CM | POA: Diagnosis not present

## 2020-06-26 DIAGNOSIS — Z79899 Other long term (current) drug therapy: Secondary | ICD-10-CM | POA: Diagnosis not present

## 2020-06-26 DIAGNOSIS — M1380 Other specified arthritis, unspecified site: Secondary | ICD-10-CM | POA: Diagnosis not present

## 2020-06-26 DIAGNOSIS — M8949 Other hypertrophic osteoarthropathy, multiple sites: Secondary | ICD-10-CM | POA: Diagnosis not present

## 2020-07-03 DIAGNOSIS — K120 Recurrent oral aphthae: Secondary | ICD-10-CM | POA: Diagnosis not present

## 2020-07-07 DIAGNOSIS — M2580 Other specified joint disorders, unspecified joint: Secondary | ICD-10-CM | POA: Diagnosis not present

## 2020-07-07 DIAGNOSIS — M79674 Pain in right toe(s): Secondary | ICD-10-CM | POA: Diagnosis not present

## 2020-07-07 DIAGNOSIS — M79675 Pain in left toe(s): Secondary | ICD-10-CM | POA: Diagnosis not present

## 2020-07-07 DIAGNOSIS — L600 Ingrowing nail: Secondary | ICD-10-CM | POA: Diagnosis not present

## 2020-07-07 DIAGNOSIS — B351 Tinea unguium: Secondary | ICD-10-CM | POA: Diagnosis not present

## 2020-07-07 DIAGNOSIS — M778 Other enthesopathies, not elsewhere classified: Secondary | ICD-10-CM | POA: Diagnosis not present

## 2020-08-20 DIAGNOSIS — M1712 Unilateral primary osteoarthritis, left knee: Secondary | ICD-10-CM | POA: Diagnosis not present

## 2020-08-20 DIAGNOSIS — M75121 Complete rotator cuff tear or rupture of right shoulder, not specified as traumatic: Secondary | ICD-10-CM | POA: Diagnosis not present

## 2020-08-20 DIAGNOSIS — M7052 Other bursitis of knee, left knee: Secondary | ICD-10-CM | POA: Diagnosis not present

## 2020-08-20 DIAGNOSIS — M25462 Effusion, left knee: Secondary | ICD-10-CM | POA: Diagnosis not present

## 2020-09-08 DIAGNOSIS — Z125 Encounter for screening for malignant neoplasm of prostate: Secondary | ICD-10-CM | POA: Diagnosis not present

## 2020-09-08 DIAGNOSIS — Z79899 Other long term (current) drug therapy: Secondary | ICD-10-CM | POA: Diagnosis not present

## 2020-09-18 DIAGNOSIS — I10 Essential (primary) hypertension: Secondary | ICD-10-CM | POA: Diagnosis not present

## 2020-09-19 ENCOUNTER — Other Ambulatory Visit: Payer: Self-pay | Admitting: Internal Medicine

## 2020-09-19 DIAGNOSIS — R519 Headache, unspecified: Secondary | ICD-10-CM

## 2020-09-22 DIAGNOSIS — R234 Changes in skin texture: Secondary | ICD-10-CM | POA: Diagnosis not present

## 2020-09-22 DIAGNOSIS — M2580 Other specified joint disorders, unspecified joint: Secondary | ICD-10-CM | POA: Diagnosis not present

## 2020-09-22 DIAGNOSIS — L600 Ingrowing nail: Secondary | ICD-10-CM | POA: Diagnosis not present

## 2020-09-22 DIAGNOSIS — B351 Tinea unguium: Secondary | ICD-10-CM | POA: Diagnosis not present

## 2020-09-22 DIAGNOSIS — M79675 Pain in left toe(s): Secondary | ICD-10-CM | POA: Diagnosis not present

## 2020-09-22 DIAGNOSIS — M79674 Pain in right toe(s): Secondary | ICD-10-CM | POA: Diagnosis not present

## 2020-09-25 DIAGNOSIS — R42 Dizziness and giddiness: Secondary | ICD-10-CM | POA: Diagnosis not present

## 2020-09-25 DIAGNOSIS — H539 Unspecified visual disturbance: Secondary | ICD-10-CM | POA: Diagnosis not present

## 2020-09-28 ENCOUNTER — Other Ambulatory Visit: Payer: Self-pay

## 2020-09-28 ENCOUNTER — Ambulatory Visit
Admission: RE | Admit: 2020-09-28 | Discharge: 2020-09-28 | Disposition: A | Discharge status: Home / Self Care | Payer: Medicare HMO | Source: Ambulatory Visit | Attending: Internal Medicine | Admitting: Internal Medicine

## 2020-09-28 DIAGNOSIS — J0130 Acute sphenoidal sinusitis, unspecified: Secondary | ICD-10-CM | POA: Diagnosis not present

## 2020-09-28 DIAGNOSIS — J322 Chronic ethmoidal sinusitis: Secondary | ICD-10-CM | POA: Diagnosis not present

## 2020-09-28 DIAGNOSIS — R519 Headache, unspecified: Secondary | ICD-10-CM | POA: Diagnosis not present

## 2020-09-28 DIAGNOSIS — J323 Chronic sphenoidal sinusitis: Secondary | ICD-10-CM | POA: Diagnosis not present

## 2020-09-28 DIAGNOSIS — J0120 Acute ethmoidal sinusitis, unspecified: Secondary | ICD-10-CM | POA: Diagnosis not present

## 2020-10-10 DIAGNOSIS — R55 Syncope and collapse: Secondary | ICD-10-CM | POA: Diagnosis not present

## 2020-10-10 DIAGNOSIS — H539 Unspecified visual disturbance: Secondary | ICD-10-CM | POA: Diagnosis not present

## 2020-10-10 DIAGNOSIS — I6523 Occlusion and stenosis of bilateral carotid arteries: Secondary | ICD-10-CM | POA: Diagnosis not present

## 2020-10-10 DIAGNOSIS — E785 Hyperlipidemia, unspecified: Secondary | ICD-10-CM | POA: Diagnosis not present

## 2020-10-16 DIAGNOSIS — H40003 Preglaucoma, unspecified, bilateral: Secondary | ICD-10-CM | POA: Diagnosis not present

## 2020-10-16 DIAGNOSIS — H353132 Nonexudative age-related macular degeneration, bilateral, intermediate dry stage: Secondary | ICD-10-CM | POA: Diagnosis not present

## 2020-10-29 DIAGNOSIS — H0011 Chalazion right upper eyelid: Secondary | ICD-10-CM | POA: Diagnosis not present

## 2020-11-06 ENCOUNTER — Ambulatory Visit: Payer: Medicare HMO | Admitting: Dermatology

## 2020-11-11 DIAGNOSIS — H0011 Chalazion right upper eyelid: Secondary | ICD-10-CM | POA: Diagnosis not present

## 2020-12-04 DIAGNOSIS — B351 Tinea unguium: Secondary | ICD-10-CM | POA: Diagnosis not present

## 2020-12-04 DIAGNOSIS — M542 Cervicalgia: Secondary | ICD-10-CM | POA: Diagnosis not present

## 2020-12-04 DIAGNOSIS — M79675 Pain in left toe(s): Secondary | ICD-10-CM | POA: Diagnosis not present

## 2020-12-04 DIAGNOSIS — M47812 Spondylosis without myelopathy or radiculopathy, cervical region: Secondary | ICD-10-CM | POA: Diagnosis not present

## 2020-12-04 DIAGNOSIS — M1712 Unilateral primary osteoarthritis, left knee: Secondary | ICD-10-CM | POA: Diagnosis not present

## 2020-12-04 DIAGNOSIS — S161XXA Strain of muscle, fascia and tendon at neck level, initial encounter: Secondary | ICD-10-CM | POA: Diagnosis not present

## 2020-12-04 DIAGNOSIS — M4012 Other secondary kyphosis, cervical region: Secondary | ICD-10-CM | POA: Diagnosis not present

## 2020-12-04 DIAGNOSIS — M79674 Pain in right toe(s): Secondary | ICD-10-CM | POA: Diagnosis not present

## 2020-12-15 DIAGNOSIS — M8949 Other hypertrophic osteoarthropathy, multiple sites: Secondary | ICD-10-CM | POA: Diagnosis not present

## 2020-12-15 DIAGNOSIS — M0600 Rheumatoid arthritis without rheumatoid factor, unspecified site: Secondary | ICD-10-CM | POA: Diagnosis not present

## 2020-12-15 DIAGNOSIS — Z79899 Other long term (current) drug therapy: Secondary | ICD-10-CM | POA: Diagnosis not present

## 2020-12-15 DIAGNOSIS — M1380 Other specified arthritis, unspecified site: Secondary | ICD-10-CM | POA: Diagnosis not present

## 2020-12-18 DIAGNOSIS — Z96651 Presence of right artificial knee joint: Secondary | ICD-10-CM | POA: Diagnosis not present

## 2020-12-23 DIAGNOSIS — S161XXA Strain of muscle, fascia and tendon at neck level, initial encounter: Secondary | ICD-10-CM | POA: Diagnosis not present

## 2020-12-23 DIAGNOSIS — M47812 Spondylosis without myelopathy or radiculopathy, cervical region: Secondary | ICD-10-CM | POA: Diagnosis not present

## 2020-12-31 DIAGNOSIS — S161XXA Strain of muscle, fascia and tendon at neck level, initial encounter: Secondary | ICD-10-CM | POA: Diagnosis not present

## 2020-12-31 DIAGNOSIS — M47812 Spondylosis without myelopathy or radiculopathy, cervical region: Secondary | ICD-10-CM | POA: Diagnosis not present

## 2021-01-02 DIAGNOSIS — S161XXA Strain of muscle, fascia and tendon at neck level, initial encounter: Secondary | ICD-10-CM | POA: Diagnosis not present

## 2021-01-02 DIAGNOSIS — M47812 Spondylosis without myelopathy or radiculopathy, cervical region: Secondary | ICD-10-CM | POA: Diagnosis not present

## 2021-01-02 DIAGNOSIS — R7309 Other abnormal glucose: Secondary | ICD-10-CM | POA: Diagnosis not present

## 2021-01-02 DIAGNOSIS — Z79899 Other long term (current) drug therapy: Secondary | ICD-10-CM | POA: Diagnosis not present

## 2021-01-02 DIAGNOSIS — N400 Enlarged prostate without lower urinary tract symptoms: Secondary | ICD-10-CM | POA: Diagnosis not present

## 2021-01-02 DIAGNOSIS — E7800 Pure hypercholesterolemia, unspecified: Secondary | ICD-10-CM | POA: Diagnosis not present

## 2021-01-05 DIAGNOSIS — M47812 Spondylosis without myelopathy or radiculopathy, cervical region: Secondary | ICD-10-CM | POA: Diagnosis not present

## 2021-01-05 DIAGNOSIS — S161XXA Strain of muscle, fascia and tendon at neck level, initial encounter: Secondary | ICD-10-CM | POA: Diagnosis not present

## 2021-01-07 ENCOUNTER — Encounter: Payer: Self-pay | Admitting: Dermatology

## 2021-01-07 ENCOUNTER — Ambulatory Visit: Payer: Medicare HMO | Admitting: Dermatology

## 2021-01-07 ENCOUNTER — Other Ambulatory Visit: Payer: Self-pay

## 2021-01-07 DIAGNOSIS — Z1283 Encounter for screening for malignant neoplasm of skin: Secondary | ICD-10-CM | POA: Diagnosis not present

## 2021-01-07 DIAGNOSIS — L570 Actinic keratosis: Secondary | ICD-10-CM | POA: Diagnosis not present

## 2021-01-07 DIAGNOSIS — L578 Other skin changes due to chronic exposure to nonionizing radiation: Secondary | ICD-10-CM

## 2021-01-07 DIAGNOSIS — L720 Epidermal cyst: Secondary | ICD-10-CM

## 2021-01-07 DIAGNOSIS — S41111A Laceration without foreign body of right upper arm, initial encounter: Secondary | ICD-10-CM

## 2021-01-07 DIAGNOSIS — D692 Other nonthrombocytopenic purpura: Secondary | ICD-10-CM | POA: Diagnosis not present

## 2021-01-07 DIAGNOSIS — L821 Other seborrheic keratosis: Secondary | ICD-10-CM | POA: Diagnosis not present

## 2021-01-07 DIAGNOSIS — L820 Inflamed seborrheic keratosis: Secondary | ICD-10-CM | POA: Diagnosis not present

## 2021-01-07 MED ORDER — MUPIROCIN 2 % EX OINT
TOPICAL_OINTMENT | CUTANEOUS | 1 refills | Status: DC
Start: 2021-01-07 — End: 2022-04-20

## 2021-01-07 NOTE — Progress Notes (Signed)
Follow-Up Visit   Subjective  Herbert Molina is a 80 y.o. male who presents for the following: Follow-up (Pt concerned about spots on his face, neck, arms, legs growing and irritating) and Skin Problem (Check a spot on the right pt bumped his arm 2 months ago ). The patient presents for Upper Body Skin Exam (UBSE) for skin cancer screening and mole check.  The following portions of the chart were reviewed this encounter and updated as appropriate:   Tobacco  Allergies  Meds  Problems  Med Hx  Surg Hx  Fam Hx     Review of Systems:  No other skin or systemic complaints except as noted in HPI or Assessment and Plan.  Objective  Well appearing patient in no apparent distress; mood and affect are within normal limits.  A focused examination was performed including face,neck,back,arms,legs . Relevant physical exam findings are noted in the Assessment and Plan.  Objective  Right Forearm - Anterior: Sore area   Objective  posterior lateral base of neck: 1.0 cm Subcutaneous papule/nodule with erythema and edema, tender to touch.   Objective  scalp x 4 (4): Erythematous thin papules/macules with gritty scale.   Objective  right mandible x 1, right thigh x 1, right knee x 1, right tricep x 2, left tricep x 1 (6): Erythematous keratotic or waxy stuck-on papule or plaque.    Assessment & Plan    Seborrheic Keratoses - Stuck-on, waxy, tan-brown papules and/or plaques  - Benign-appearing - Discussed benign etiology and prognosis. - Observe - Call for any changes  Actinic Damage - Chronic condition, secondary to cumulative UV/sun exposure - diffuse scaly erythematous macules with underlying dyspigmentation - Recommend daily broad spectrum sunscreen SPF 30+ to sun-exposed areas, reapply every 2 hours as needed.  - Staying in the shade or wearing long sleeves, sun glasses (UVA+UVB protection) and wide brim hats (4-inch brim around the entire circumference of the hat) are also  recommended for sun protection.  - Call for new or changing lesions.  Laceration of right upper extremity, initial encounter Right Forearm - Anterior Start Mupirocin ointment apply to sore area on the arm twice a day  Ordered Medications: mupirocin ointment (BACTROBAN) 2 %  Epidermal cyst posterior lateral base of neck Symptomatic cyst  Recommend surgery removal   AK (actinic keratosis) (4) scalp x 4 Destruction of lesion - scalp x 4 Complexity: simple   Destruction method: cryotherapy   Informed consent: discussed and consent obtained   Timeout:  patient name, date of birth, surgical site, and procedure verified Lesion destroyed using liquid nitrogen: Yes   Region frozen until ice ball extended beyond lesion: Yes   Outcome: patient tolerated procedure well with no complications   Post-procedure details: wound care instructions given    Inflamed seborrheic keratosis (6) right mandible x 1, right thigh x 1, right knee x 1, right tricep x 2, left tricep x 1 Destruction of lesion - right mandible x 1, right thigh x 1, right knee x 1, right tricep x 2, left tricep x 1 Complexity: simple   Destruction method: cryotherapy   Informed consent: discussed and consent obtained   Timeout:  patient name, date of birth, surgical site, and procedure verified Lesion destroyed using liquid nitrogen: Yes   Region frozen until ice ball extended beyond lesion: Yes   Outcome: patient tolerated procedure well with no complications   Post-procedure details: wound care instructions given    Purpura - Chronic; persistent and recurrent.  Treatable, but not curable. - Violaceous macules and patches - Benign - Related to trauma, age, sun damage and/or use of blood thinners, chronic use of topical and/or oral steroids - Observe - Can use OTC arnica containing moisturizer such as Dermend Bruise Formula if desired - Call for worsening or other concerns  Return for cyst surgery .  IMarye Round,  CMA, am acting as scribe for Sarina Ser, MD .  Documentation: I have reviewed the above documentation for accuracy and completeness, and I agree with the above.  Sarina Ser, MD

## 2021-01-07 NOTE — Patient Instructions (Addendum)
    Pre-Operative Instructions  You are scheduled for a surgical procedure at [facility]. We recommend you read the following instructions. If you have any questions or concerns, please call the office at [phone].  1. Shower and wash the entire body with soap and water the day of your surgery paying special attention to cleansing at and around the planned surgery site.  2. Avoid aspirin or aspirin containing products at least fourteen (14) days prior to your surgical procedure and for at least one week (7 Days) after your surgical procedure. If you take aspirin on a regular basis for heart disease or history of stroke or for any other reason, we may recommend you continue taking aspirin but please notify us if you take this on a regular basis. Aspirin can cause more bleeding to occur during surgery as well as prolonged bleeding and bruising after surgery.   3. Avoid other nonsteroidal pain medications at least one week prior to surgery and at least one week prior to your surgery. These include medications such as Ibuprofen (Motrin, Advil and Nuprin), Naprosyn, Voltaren, Relafen, etc. If medications are used for therapeutic reasons, please inform us as they can cause increased bleeding or prolonged bleeding during and bruising after surgical procedures.   4. Please advise [place] if you are taking any "blood thinner" medications such as Coumadin or Dipyridamole or Plavix or similar medications. These cause increased bleeding and prolonged bleeding during procedures and bruising after surgical procedures. We may have to consider discontinuing these medications briefly prior to and shortly after your surgery if safe to do so.   5. Please inform us of all medications you are currently taking. All medications that are taken regularly should be taken the day of surgery as you always do. Nevertheless, we need to be informed of what medications you are taking prior to surgery to know whether they  will affect the procedure or cause any complications.   6. Please inform us of any medication allergies. Also inform us of whether you have allergies to Latex or rubber products or whether you have had any adverse reaction to Lidocaine or Epinephrine.  7. Please inform us of any prosthetic or artificial body parts such as artificial heart valve, joint replacements, etc., or similar condition that might require preoperative antibiotics.   8. We recommend avoidance of alcohol at least two weeks prior to surgery and continued avoidance for at least two weeks after surgery.   9. We recommend discontinuation of tobacco smoking at least two weeks prior to surgery and continued abstinence for at least two weeks after surgery.  10. Do not plan strenuous exercise, strenuous work or strenuous lifting for approximately four weeks after your surgery.   11. We request if you are unable to make your scheduled surgical appointment, please call us at least a week in advance or as soon as you are aware of a problem so that we can cancel or reschedule the appointment.   12. You MAY TAKE TYLENOL (acetaminophen) for pain as it is not a blood thinner.   13. PLEASE PLAN TO BE IN TOWN FOR TWO WEEKS FOLLOWING SURGERY, THIS IS IMPORTANT SO YOU CAN BE CHECKED FOR DRESSING CHANGES, SUTURE REMOVAL AND TO MONITOR FOR POSSIBLE COMPLICATIONS.     Cryotherapy Aftercare  . Wash gently with soap and water everyday.   Marland Kitchen Apply Vaseline and Band-Aid daily until healed.

## 2021-01-08 DIAGNOSIS — M47812 Spondylosis without myelopathy or radiculopathy, cervical region: Secondary | ICD-10-CM | POA: Diagnosis not present

## 2021-01-08 DIAGNOSIS — S161XXA Strain of muscle, fascia and tendon at neck level, initial encounter: Secondary | ICD-10-CM | POA: Diagnosis not present

## 2021-01-12 DIAGNOSIS — S161XXA Strain of muscle, fascia and tendon at neck level, initial encounter: Secondary | ICD-10-CM | POA: Diagnosis not present

## 2021-01-12 DIAGNOSIS — M47812 Spondylosis without myelopathy or radiculopathy, cervical region: Secondary | ICD-10-CM | POA: Diagnosis not present

## 2021-01-13 ENCOUNTER — Encounter: Payer: Self-pay | Admitting: Dermatology

## 2021-01-15 DIAGNOSIS — S161XXA Strain of muscle, fascia and tendon at neck level, initial encounter: Secondary | ICD-10-CM | POA: Diagnosis not present

## 2021-01-15 DIAGNOSIS — M47812 Spondylosis without myelopathy or radiculopathy, cervical region: Secondary | ICD-10-CM | POA: Diagnosis not present

## 2021-01-20 DIAGNOSIS — S161XXA Strain of muscle, fascia and tendon at neck level, initial encounter: Secondary | ICD-10-CM | POA: Diagnosis not present

## 2021-01-20 DIAGNOSIS — M47812 Spondylosis without myelopathy or radiculopathy, cervical region: Secondary | ICD-10-CM | POA: Diagnosis not present

## 2021-02-17 ENCOUNTER — Encounter: Payer: Medicare HMO | Admitting: Dermatology

## 2021-02-24 ENCOUNTER — Encounter: Payer: Medicare HMO | Admitting: Dermatology

## 2021-03-05 DIAGNOSIS — M25872 Other specified joint disorders, left ankle and foot: Secondary | ICD-10-CM | POA: Diagnosis not present

## 2021-03-05 DIAGNOSIS — L600 Ingrowing nail: Secondary | ICD-10-CM | POA: Diagnosis not present

## 2021-03-05 DIAGNOSIS — B351 Tinea unguium: Secondary | ICD-10-CM | POA: Diagnosis not present

## 2021-03-05 DIAGNOSIS — M778 Other enthesopathies, not elsewhere classified: Secondary | ICD-10-CM | POA: Diagnosis not present

## 2021-03-05 DIAGNOSIS — R234 Changes in skin texture: Secondary | ICD-10-CM | POA: Diagnosis not present

## 2021-04-01 DIAGNOSIS — Z79899 Other long term (current) drug therapy: Secondary | ICD-10-CM | POA: Diagnosis not present

## 2021-04-01 DIAGNOSIS — E7800 Pure hypercholesterolemia, unspecified: Secondary | ICD-10-CM | POA: Diagnosis not present

## 2021-04-01 DIAGNOSIS — I10 Essential (primary) hypertension: Secondary | ICD-10-CM | POA: Diagnosis not present

## 2021-04-08 DIAGNOSIS — M25552 Pain in left hip: Secondary | ICD-10-CM | POA: Diagnosis not present

## 2021-04-08 DIAGNOSIS — M1612 Unilateral primary osteoarthritis, left hip: Secondary | ICD-10-CM | POA: Diagnosis not present

## 2021-04-08 DIAGNOSIS — M1712 Unilateral primary osteoarthritis, left knee: Secondary | ICD-10-CM | POA: Diagnosis not present

## 2021-04-14 DIAGNOSIS — H353132 Nonexudative age-related macular degeneration, bilateral, intermediate dry stage: Secondary | ICD-10-CM | POA: Diagnosis not present

## 2021-04-15 DIAGNOSIS — Z79899 Other long term (current) drug therapy: Secondary | ICD-10-CM | POA: Diagnosis not present

## 2021-04-15 DIAGNOSIS — E7800 Pure hypercholesterolemia, unspecified: Secondary | ICD-10-CM | POA: Diagnosis not present

## 2021-04-15 DIAGNOSIS — I10 Essential (primary) hypertension: Secondary | ICD-10-CM | POA: Diagnosis not present

## 2021-04-15 DIAGNOSIS — R7309 Other abnormal glucose: Secondary | ICD-10-CM | POA: Diagnosis not present

## 2021-04-17 DIAGNOSIS — D649 Anemia, unspecified: Secondary | ICD-10-CM | POA: Diagnosis not present

## 2021-04-17 DIAGNOSIS — Z79899 Other long term (current) drug therapy: Secondary | ICD-10-CM | POA: Diagnosis not present

## 2021-04-17 DIAGNOSIS — E7800 Pure hypercholesterolemia, unspecified: Secondary | ICD-10-CM | POA: Diagnosis not present

## 2021-04-17 DIAGNOSIS — R7309 Other abnormal glucose: Secondary | ICD-10-CM | POA: Diagnosis not present

## 2021-04-17 DIAGNOSIS — I10 Essential (primary) hypertension: Secondary | ICD-10-CM | POA: Diagnosis not present

## 2021-05-05 DIAGNOSIS — S51811A Laceration without foreign body of right forearm, initial encounter: Secondary | ICD-10-CM | POA: Diagnosis not present

## 2021-05-06 DIAGNOSIS — B351 Tinea unguium: Secondary | ICD-10-CM | POA: Diagnosis not present

## 2021-05-06 DIAGNOSIS — M79674 Pain in right toe(s): Secondary | ICD-10-CM | POA: Diagnosis not present

## 2021-05-06 DIAGNOSIS — M79675 Pain in left toe(s): Secondary | ICD-10-CM | POA: Diagnosis not present

## 2021-05-13 DIAGNOSIS — M25511 Pain in right shoulder: Secondary | ICD-10-CM | POA: Diagnosis not present

## 2021-05-13 DIAGNOSIS — G8929 Other chronic pain: Secondary | ICD-10-CM | POA: Diagnosis not present

## 2021-05-13 DIAGNOSIS — M75121 Complete rotator cuff tear or rupture of right shoulder, not specified as traumatic: Secondary | ICD-10-CM | POA: Diagnosis not present

## 2021-07-20 ENCOUNTER — Ambulatory Visit: Payer: Medicare HMO | Admitting: Dermatology

## 2021-08-13 DIAGNOSIS — B351 Tinea unguium: Secondary | ICD-10-CM | POA: Diagnosis not present

## 2021-08-13 DIAGNOSIS — R234 Changes in skin texture: Secondary | ICD-10-CM | POA: Diagnosis not present

## 2021-08-13 DIAGNOSIS — L600 Ingrowing nail: Secondary | ICD-10-CM | POA: Diagnosis not present

## 2021-08-13 DIAGNOSIS — M778 Other enthesopathies, not elsewhere classified: Secondary | ICD-10-CM | POA: Diagnosis not present

## 2021-08-13 DIAGNOSIS — M25871 Other specified joint disorders, right ankle and foot: Secondary | ICD-10-CM | POA: Diagnosis not present

## 2021-09-02 DIAGNOSIS — M75121 Complete rotator cuff tear or rupture of right shoulder, not specified as traumatic: Secondary | ICD-10-CM | POA: Diagnosis not present

## 2021-09-02 DIAGNOSIS — M1712 Unilateral primary osteoarthritis, left knee: Secondary | ICD-10-CM | POA: Diagnosis not present

## 2021-09-02 DIAGNOSIS — M25511 Pain in right shoulder: Secondary | ICD-10-CM | POA: Diagnosis not present

## 2021-09-02 DIAGNOSIS — G8929 Other chronic pain: Secondary | ICD-10-CM | POA: Diagnosis not present

## 2021-09-07 ENCOUNTER — Other Ambulatory Visit: Payer: Self-pay

## 2021-09-07 ENCOUNTER — Ambulatory Visit: Payer: Medicare HMO | Admitting: Dermatology

## 2021-09-07 DIAGNOSIS — L578 Other skin changes due to chronic exposure to nonionizing radiation: Secondary | ICD-10-CM

## 2021-09-07 DIAGNOSIS — L570 Actinic keratosis: Secondary | ICD-10-CM

## 2021-09-07 DIAGNOSIS — D692 Other nonthrombocytopenic purpura: Secondary | ICD-10-CM

## 2021-09-07 DIAGNOSIS — L820 Inflamed seborrheic keratosis: Secondary | ICD-10-CM

## 2021-09-07 NOTE — Patient Instructions (Signed)

## 2021-09-07 NOTE — Progress Notes (Signed)
   Follow-Up Visit   Subjective  Herbert Molina is a 80 y.o. male who presents for the following: Other (Spots of scalp - keep coming back and are scaly. He uses apple cider vinegar and alcohol to calm them down). The patient has spots, moles and lesions to be evaluated, some may be new or changing and the patient has concerns that these could be cancer.  The following portions of the chart were reviewed this encounter and updated as appropriate:   Tobacco  Allergies  Meds  Problems  Med Hx  Surg Hx  Fam Hx     Review of Systems:  No other skin or systemic complaints except as noted in HPI or Assessment and Plan.  Objective  Well appearing patient in no apparent distress; mood and affect are within normal limits.  A focused examination was performed including face, scalp. Relevant physical exam findings are noted in the Assessment and Plan.  Scalp (20) Erythematous thin papules/macules with gritty scale.   Right Anterior Mandible Erythematous keratotic or waxy stuck-on papule or plaque.    Assessment & Plan   Actinic Damage - chronic, secondary to cumulative UV radiation exposure/sun exposure over time - diffuse scaly erythematous macules with underlying dyspigmentation - Recommend daily broad spectrum sunscreen SPF 30+ to sun-exposed areas, reapply every 2 hours as needed.  - Recommend staying in the shade or wearing long sleeves, sun glasses (UVA+UVB protection) and wide brim hats (4-inch brim around the entire circumference of the hat). - Call for new or changing lesions.  Purpura - Chronic; persistent and recurrent.  Treatable, but not curable. - Violaceous macules and patches - Benign - Related to trauma, age, sun damage and/or use of blood thinners, chronic use of topical and/or oral steroids - Observe - Can use OTC arnica containing moisturizer such as Dermend Bruise Formula if desired - Call for worsening or other concerns  AK (actinic keratosis)  (20) Scalp  Destruction of lesion - Scalp Complexity: simple   Destruction method: cryotherapy   Informed consent: discussed and consent obtained   Timeout:  patient name, date of birth, surgical site, and procedure verified Lesion destroyed using liquid nitrogen: Yes   Region frozen until ice ball extended beyond lesion: Yes   Outcome: patient tolerated procedure well with no complications   Post-procedure details: wound care instructions given    Inflamed seborrheic keratosis Right Anterior Mandible  Destruction of lesion - Right Anterior Mandible Complexity: simple   Destruction method: cryotherapy   Informed consent: discussed and consent obtained   Timeout:  patient name, date of birth, surgical site, and procedure verified Lesion destroyed using liquid nitrogen: Yes   Region frozen until ice ball extended beyond lesion: Yes   Outcome: patient tolerated procedure well with no complications   Post-procedure details: wound care instructions given    Return in about 4 months (around 01/06/2022) for AK follow up.  I, Ashok Cordia, CMA, am acting as scribe for Sarina Ser, MD . Documentation: I have reviewed the above documentation for accuracy and completeness, and I agree with the above.  Sarina Ser, MD

## 2021-09-16 ENCOUNTER — Encounter: Payer: Self-pay | Admitting: Dermatology

## 2021-10-13 DIAGNOSIS — H40003 Preglaucoma, unspecified, bilateral: Secondary | ICD-10-CM | POA: Diagnosis not present

## 2021-10-26 ENCOUNTER — Other Ambulatory Visit: Payer: Self-pay

## 2021-10-26 ENCOUNTER — Ambulatory Visit: Payer: Medicare HMO | Admitting: Dermatology

## 2021-10-26 DIAGNOSIS — L820 Inflamed seborrheic keratosis: Secondary | ICD-10-CM | POA: Diagnosis not present

## 2021-10-26 DIAGNOSIS — L570 Actinic keratosis: Secondary | ICD-10-CM | POA: Diagnosis not present

## 2021-10-26 DIAGNOSIS — L821 Other seborrheic keratosis: Secondary | ICD-10-CM

## 2021-10-26 DIAGNOSIS — D691 Qualitative platelet defects: Secondary | ICD-10-CM | POA: Diagnosis not present

## 2021-10-26 DIAGNOSIS — L578 Other skin changes due to chronic exposure to nonionizing radiation: Secondary | ICD-10-CM | POA: Diagnosis not present

## 2021-10-26 MED ORDER — FLUOROURACIL 5 % EX CREA: TOPICAL_CREAM | CUTANEOUS | 0 refills | Status: DC

## 2021-10-26 NOTE — Patient Instructions (Addendum)
Purpura - Chronic; persistent and recurrent.  Treatable, but not curable. - Violaceous macules and patches on the arms  - Benign - Related to trauma, age, sun damage and/or use of blood thinners, chronic use of topical and/or oral steroids - Observe - Can use OTC arnica containing moisturizer such as Dermend Bruise Formula if desired - Call for worsening or other concerns    5-Fluorouracil/Calcipotriene Patient Education   In 1 month begin  5FU/Calcipotriene cream apply to scalp twice a day for 7 days then stop  [facility]  Actinic keratoses are the dry, red scaly spots on the skin caused by sun damage. A portion of these spots can turn into skin cancer with time, and treating them can help prevent development of skin cancer.   Treatment of these spots requires removal of the defective skin cells. There are various ways to remove actinic keratoses, including freezing with liquid nitrogen, treatment with creams, or treatment with a blue light procedure in the office.   5-fluorouracil cream is a topical cream used to treat actinic keratoses. It works by interfering with the growth of abnormal fast-growing skin cells, such as actinic keratoses. These cells peel off and are replaced by healthy ones.   5-fluorouracil/calcipotriene is a combination of the 5-fluorouracil cream with a vitamin D analog cream called calcipotriene. The calcipotriene alone does not treat actinic keratoses. However, when it is combined with 5-fluorouracil, it helps the 5-fluorouracil treat the actinic keratoses much faster so that the same results can be achieved with a much shorter treatment time.  INSTRUCTIONS FOR 5-FLUOROURACIL/CALCIPOTRIENE CREAM:   5-fluorouracil/calcipotriene cream typically only needs to be used for 4-7 days. A thin layer should be applied twice a day to the treatment areas recommended by your physician.   If your physician prescribed you separate tubes of 5-fluourouracil and  calcipotriene, apply a thin layer of 5-fluorouracil followed by a thin layer of calcipotriene.   Avoid contact with your eyes, nostrils, and mouth. Do not use 5-fluorouracil/calcipotriene cream on infected or open wounds.   You will develop redness, irritation and some crusting at areas where you have pre-cancer damage/actinic keratoses. IF YOU DEVELOP PAIN, BLEEDING, OR SIGNIFICANT CRUSTING, STOP THE TREATMENT EARLY - you have already gotten a good response and the actinic keratoses should clear up well.  Wash your hands after applying 5-fluorouracil 5% cream on your skin.   A moisturizer or sunscreen with a minimum SPF 30 should be applied each morning.   Once you have finished the treatment, you can apply a thin layer of Vaseline twice a day to irritated areas to soothe and calm the areas more quickly. If you experience significant discomfort, contact your physician.  For some patients it is necessary to repeat the treatment for best results.  SIDE EFFECTS: When using 5-fluorouracil/calcipotriene cream, you may have mild irritation, such as redness, dryness, swelling, or a mild burning sensation. This usually resolves within 2 weeks. The more actinic keratoses you have, the more redness and inflammation you can expect during treatment. Eye irritation has been reported rarely. If this occurs, please let us know.  If you have any trouble using this cream, please call the office. If you have any other questions about this information, please do not hesitate to ask me before you leave the office.   Cryotherapy Aftercare  Wash gently with soap and water everyday.   Apply Vaseline and Band-Aid daily until healed.    If You Need Anything After Your Visit  If  you have any questions or concerns for your doctor, please call our main line at [phone] and press option 4 to reach your doctor's medical assistant. If no one answers, please leave a voicemail as directed and we will return your call  as soon as possible. Messages left after 4 pm will be answered the following business day.   You may also send [place] a message via [place]. We typically respond to MyChart messages within 1-2 business days.  For prescription refills, please ask your pharmacy to contact our office. Our fax number is [phone].  If you have an urgent issue when the clinic is closed that cannot wait until the next business day, you can page your doctor at the number below.    Please note that while we do our best to be available for urgent issues outside of office hours, we are not available 24/7.   If you have an urgent issue and are unable to reach [place], you may choose to seek medical care at your doctor's office, retail clinic, urgent care center, or emergency room.  If you have a medical emergency, please immediately call 911 or go to the emergency department.  Pager Numbers  - Dr. Nehemiah Massed: [phone]  - Dr. Laurence Ferrari: [phone]  - Dr. Nicole Kindred: [phone]  In the event of inclement weather, please call our main line at [phone] for an update on the status of any delays or closures.  Dermatology Medication Tips: Please keep the boxes that topical medications come in in order to help keep track of the instructions about where and how to use these. Pharmacies typically print the medication instructions only on the boxes and not directly on the medication tubes.   If your medication is too expensive, please contact our office at [phone] option 4 or send [place] a message through [place].   We are unable to tell what your co-pay for medications will be in advance as this is different depending on your insurance coverage. However, we may be able to find a substitute medication at lower cost or fill out paperwork to get insurance to cover a needed medication.   If a prior authorization is required to get your medication covered by your insurance company, please allow [place] 1-2 business days to complete  this process.  Drug prices often vary depending on where the prescription is filled and some pharmacies may offer cheaper prices.  The website www.goodrx.com contains coupons for medications through different pharmacies. The prices here do not account for what the cost may be with help from insurance (it may be cheaper with your insurance), but the website can give you the price if you did not use any insurance.  - You can print the associated coupon and take it with your prescription to the pharmacy.  - You may also stop by our office during regular business hours and pick up a GoodRx coupon card.  - If you need your prescription sent electronically to a different pharmacy, notify our office through [facility] or by phone at [phone] option 4.     Si Usted Necesita Algo Despus de Su Visita  Tambin puede enviarnos un mensaje a travs de Pharmacist, community. Por lo general respondemos a los mensajes de MyChart en el transcurso de 1 a 2 das hbiles.  Para renovar recetas, por favor pida a su farmacia que se ponga en contacto con nuestra oficina. Harland Dingwall de fax es [place] [phone].  Si tiene un asunto urgente cuando la clnica est cerrada  y que no puede esperar hasta el siguiente da hbil, puede llamar/localizar a su doctor(a) al nmero que aparece a continuacin.   Por favor, tenga en cuenta que aunque hacemos todo lo posible para estar disponibles para asuntos urgentes fuera del horario de [place], no estamos disponibles las 24 horas del da, los 7 das de la [place].   Si tiene un problema urgente y no puede comunicarse con nosotros, puede optar por buscar atencin mdica  en el consultorio de su doctor(a), en una clnica privada, en un centro de atencin urgente o en una sala de emergencias.  Si tiene Engineering geologist, por favor llame inmediatamente al 911 o vaya a la sala de emergencias.  Nmeros de bper  - Dr. Nehemiah Massed: [phone]  - Dra. Moye: [phone]  -  Dra. Nicole Kindred: [phone]  En caso de inclemencias del [place], por favor llame a Johnsie Kindred principal al [phone] para una actualizacin sobre el [place] de cualquier retraso o cierre.  Consejos para la medicacin en dermatologa: Por favor, guarde las cajas en las que vienen los medicamentos de uso tpico para ayudarle a seguir las instrucciones sobre dnde y cmo usarlos. Las farmacias generalmente imprimen las instrucciones del medicamento slo en las cajas y no directamente en los tubos del [place].   Si su medicamento es muy caro, por favor, pngase en contacto con Zigmund Daniel llamando al [phone] y presione la opcin 4 o envenos un mensaje a travs de Pharmacist, community.   No podemos decirle cul ser su copago por los medicamentos por adelantado ya que esto es diferente dependiendo de la cobertura de su seguro. Sin embargo, es posible que podamos encontrar un medicamento sustituto a Electrical engineer un formulario para que el seguro cubra el medicamento que se considera necesario.   Si se requiere una autorizacin previa para que su compaa de seguros [place] su medicamento, por favor permtanos de 1 a 2 das hbiles para completar este proceso.  Los precios de los medicamentos varan con frecuencia dependiendo del Environmental consultant de dnde se surte la receta y alguna farmacias pueden ofrecer precios ms baratos.  El sitio web www.goodrx.com tiene cupones para medicamentos de Airline pilot. Los precios aqu no tienen en cuenta lo que podra costar con la ayuda del seguro (puede ser ms barato con su seguro), pero el sitio web puede darle el precio si no utiliz Research scientist (physical sciences).  - Puede imprimir el cupn correspondiente y llevarlo con su receta a la farmacia.  - Tambin puede pasar por nuestra oficina durante el horario de atencin regular y Charity fundraiser una tarjeta de cupones de GoodRx.  - Si necesita que su receta se enve electrnicamente a una farmacia diferente, informe a nuestra  oficina a travs de MyChart de  o por telfono llamando al [phone] y presione la opcin 4.

## 2021-10-26 NOTE — Progress Notes (Signed)
Follow-Up Visit   Subjective  Herbert Molina is a 81 y.o. male who presents for the following: Actinic Keratosis (6 weeks f/u on Aks on scalp, pt report scalp is tender ). The patient has spots, moles and lesions to be evaluated, some may be new or changing and the patient has concerns that these could be cancer.   The following portions of the chart were reviewed this encounter and updated as appropriate:   Tobacco   Allergies   Meds   Problems   Med Hx   Surg Hx   Fam Hx      Review of Systems:  No other skin or systemic complaints except as noted in HPI or Assessment and Plan.  Objective  Well appearing patient in no apparent distress; mood and affect are within normal limits.  A focused examination was performed including face,scalp,arms. Relevant physical exam findings are noted in the Assessment and Plan.  Scalp x 17, left x 3  (20) (20) Erythematous thin papules/macules with gritty scale.   left ear concha x 1, arms x 4 (5) Stuck-on, waxy, tan-brown papule   Assessment & Plan  AK (actinic keratosis) (20) Scalp x 17, left x 3  (20)  Actinic keratoses are precancerous spots that appear secondary to cumulative UV radiation exposure/sun exposure over time. They are chronic with expected duration over 1 year. A portion of actinic keratoses will progress to squamous cell carcinoma of the skin. It is not possible to reliably predict which spots will progress to skin cancer and so treatment is recommended to prevent development of skin cancer.  Recommend daily broad spectrum sunscreen SPF 30+ to sun-exposed areas, reapply every 2 hours as needed.  Recommend staying in the shade or wearing long sleeves, sun glasses (UVA+UVB protection) and wide brim hats (4-inch brim around the entire circumference of the hat). Call for new or changing lesions.   Destruction of lesion - Scalp x 17, left x 3  (20) Complexity: simple   Destruction method: cryotherapy   Informed consent: discussed  and consent obtained   Timeout:  patient name, date of birth, surgical site, and procedure verified Lesion destroyed using liquid nitrogen: Yes   Region frozen until ice ball extended beyond lesion: Yes   Outcome: patient tolerated procedure well with no complications   Post-procedure details: wound care instructions given    Related Medications fluorouracil (EFUDEX) 5 % cream Apply to scalp twice a day for 7 days, then stop  Inflamed seborrheic keratosis (5) left ear concha x 1, arms x 4  Reassured benign age-related growth.  Recommend observation.  Discussed cryotherapy if spot(s) become irritated or inflamed.   Destruction of lesion - left ear concha x 1, arms x 4 Complexity: simple   Destruction method: cryotherapy   Informed consent: discussed and consent obtained   Timeout:  patient name, date of birth, surgical site, and procedure verified Lesion destroyed using liquid nitrogen: Yes   Region frozen until ice ball extended beyond lesion: Yes   Outcome: patient tolerated procedure well with no complications   Post-procedure details: wound care instructions given     Actinic Damage - Severe, confluent actinic changes with pre-cancerous actinic keratoses  - Severe, chronic, not at goal, secondary to cumulative UV radiation exposure over time - diffuse scaly erythematous macules and papules with underlying dyspigmentation - Discussed Prescription "Field Treatment" for Severe, Chronic Confluent Actinic Changes with Pre-Cancerous Actinic Keratoses In 1 month begin  5FU/Calcipotriene cream apply to scalp twice a  day for 7 days then stop  Field treatment involves treatment of an entire area of skin that has confluent Actinic Changes (Sun/ Ultraviolet light damage) and PreCancerous Actinic Keratoses by method of PhotoDynamic Therapy (PDT) and/or prescription Topical Chemotherapy agents such as 5-fluorouracil, 5-fluorouracil/calcipotriene, and/or imiquimod.  The purpose is to decrease  the number of clinically evident and subclinical PreCancerous lesions to prevent progression to development of skin cancer by chemically destroying early precancer changes that may or may not be visible.  It has been shown to reduce the risk of developing skin cancer in the treated area. As a result of treatment, redness, scaling, crusting, and open sores may occur during treatment course. One or more than one of these methods may be used and may have to be used several times to control, suppress and eliminate the PreCancerous changes. Discussed treatment course, expected reaction, and possible side effects. - Recommend daily broad spectrum sunscreen SPF 30+ to sun-exposed areas, reapply every 2 hours as needed.  - Staying in the shade or wearing long sleeves, sun glasses (UVA+UVB protection) and wide brim hats (4-inch brim around the entire circumference of the hat) are also recommended. - Call for new or changing lesions.   Purpura - Chronic; persistent and recurrent.  Treatable, but not curable. - Violaceous macules and patches - Benign - Related to trauma, age, sun damage and/or use of blood thinners, chronic use of topical and/or oral steroids - Observe - Can use OTC arnica containing moisturizer such as Dermend Bruise Formula if desired - Call for worsening or other concerns   Seborrheic Keratoses - Stuck-on, waxy, tan-brown papules and/or plaques  - Benign-appearing - Discussed benign etiology and prognosis. - Observe - Call for any changes  Return in about 3 months (around 01/24/2022) for Aks .  IMarye Round, CMA, am acting as scribe for Sarina Ser, MD .  Documentation: I have reviewed the above documentation for accuracy and completeness, and I agree with the above.  Sarina Ser, MD

## 2021-10-31 ENCOUNTER — Encounter: Payer: Self-pay | Admitting: Dermatology

## 2021-11-02 DIAGNOSIS — M159 Polyosteoarthritis, unspecified: Secondary | ICD-10-CM | POA: Diagnosis not present

## 2021-11-02 DIAGNOSIS — Z79899 Other long term (current) drug therapy: Secondary | ICD-10-CM | POA: Diagnosis not present

## 2021-11-02 DIAGNOSIS — M1380 Other specified arthritis, unspecified site: Secondary | ICD-10-CM | POA: Diagnosis not present

## 2021-11-02 DIAGNOSIS — M0609 Rheumatoid arthritis without rheumatoid factor, multiple sites: Secondary | ICD-10-CM | POA: Diagnosis not present

## 2021-11-04 DIAGNOSIS — B351 Tinea unguium: Secondary | ICD-10-CM | POA: Diagnosis not present

## 2021-11-04 DIAGNOSIS — M25871 Other specified joint disorders, right ankle and foot: Secondary | ICD-10-CM | POA: Diagnosis not present

## 2021-11-04 DIAGNOSIS — Q828 Other specified congenital malformations of skin: Secondary | ICD-10-CM | POA: Diagnosis not present

## 2021-11-04 DIAGNOSIS — M79674 Pain in right toe(s): Secondary | ICD-10-CM | POA: Diagnosis not present

## 2021-11-04 DIAGNOSIS — M79675 Pain in left toe(s): Secondary | ICD-10-CM | POA: Diagnosis not present

## 2021-11-04 DIAGNOSIS — L600 Ingrowing nail: Secondary | ICD-10-CM | POA: Diagnosis not present

## 2021-11-04 DIAGNOSIS — M25872 Other specified joint disorders, left ankle and foot: Secondary | ICD-10-CM | POA: Diagnosis not present

## 2021-11-04 DIAGNOSIS — M778 Other enthesopathies, not elsewhere classified: Secondary | ICD-10-CM | POA: Diagnosis not present

## 2021-11-11 DIAGNOSIS — E7800 Pure hypercholesterolemia, unspecified: Secondary | ICD-10-CM | POA: Diagnosis not present

## 2021-11-11 DIAGNOSIS — Z79899 Other long term (current) drug therapy: Secondary | ICD-10-CM | POA: Diagnosis not present

## 2021-11-11 DIAGNOSIS — R7309 Other abnormal glucose: Secondary | ICD-10-CM | POA: Diagnosis not present

## 2021-11-11 DIAGNOSIS — Z125 Encounter for screening for malignant neoplasm of prostate: Secondary | ICD-10-CM | POA: Diagnosis not present

## 2021-11-18 DIAGNOSIS — E785 Hyperlipidemia, unspecified: Secondary | ICD-10-CM | POA: Diagnosis not present

## 2021-11-18 DIAGNOSIS — M1990 Unspecified osteoarthritis, unspecified site: Secondary | ICD-10-CM | POA: Diagnosis not present

## 2021-11-18 DIAGNOSIS — R413 Other amnesia: Secondary | ICD-10-CM | POA: Diagnosis not present

## 2021-11-18 DIAGNOSIS — Z0000 Encounter for general adult medical examination without abnormal findings: Secondary | ICD-10-CM | POA: Diagnosis not present

## 2021-11-18 DIAGNOSIS — I10 Essential (primary) hypertension: Secondary | ICD-10-CM | POA: Diagnosis not present

## 2021-11-22 DIAGNOSIS — S51811A Laceration without foreign body of right forearm, initial encounter: Secondary | ICD-10-CM | POA: Diagnosis not present

## 2021-11-23 ENCOUNTER — Other Ambulatory Visit: Payer: Self-pay

## 2021-11-23 ENCOUNTER — Encounter: Payer: Medicare HMO | Attending: Physician Assistant | Admitting: Physician Assistant

## 2021-11-23 DIAGNOSIS — X58XXXA Exposure to other specified factors, initial encounter: Secondary | ICD-10-CM | POA: Insufficient documentation

## 2021-11-23 DIAGNOSIS — L98498 Non-pressure chronic ulcer of skin of other sites with other specified severity: Secondary | ICD-10-CM | POA: Diagnosis not present

## 2021-11-23 DIAGNOSIS — I10 Essential (primary) hypertension: Secondary | ICD-10-CM | POA: Insufficient documentation

## 2021-11-23 DIAGNOSIS — S51811A Laceration without foreign body of right forearm, initial encounter: Secondary | ICD-10-CM | POA: Insufficient documentation

## 2021-11-23 NOTE — Progress Notes (Signed)
HARRELL, NIEHOFF ([id number]) Visit Report for 11/23/2021 Allergy List Details Patient Name: Herbert Molina, Herbert Molina. Date of Service: 11/23/2021 3:30 PM Medical Record Number: [id number] Patient Account Number: [id number] Date of Birth/Sex: 10-25-1940 (81 y.o. M) Treating RN: Cornell Barman Primary Care Mariamawit Depaoli: Fulton Reek Other Clinician: Referring Mandy Peeks: Fulton Reek Treating Lucila Klecka/Extender: Suella Grove in Treatment: 0 Allergies Active Allergies No Known Drug Allergies Allergy Notes Electronic Signature(s) Signed: 11/23/2021 5:11:02 PM By: Gretta Cool, BSN, RN, CWS, Kim RN, BSN Entered By: Gretta Cool, BSN, RN, CWS, Kim on 11/23/2021 15:35:16 Lobban, [name] ([id number]) -------------------------------------------------------------------------------- Arrival Information Details Patient Name: Herbert Molina Date of Service: 11/23/2021 3:30 PM Medical Record Number: [id number] Patient Account Number: [id number] Date of Birth/Sex: October 10, 1940 (81 y.o. M) Treating RN: Cornell Barman Primary Care Emersen Carroll: Fulton Reek Other Clinician: Referring Hayze Gazda: Fulton Reek Treating Eulamae Greenstein/Extender: Skipper Cliche in Treatment: 0 Visit Information Patient Arrived: Ambulatory Arrival Time: 15:23 Accompanied By: wife Transfer Assistance: None Patient Identification Verified: Yes Secondary Verification Process Completed: Yes Patient Requires Transmission-Based No Precautions: Patient Has Alerts: Yes Patient Alerts: Patient on Blood Thinner NOT DIABETIC 81mg  aspirin Electronic Signature(s) Signed: 11/23/2021 5:11:47 PM By: Gretta Cool, BSN, RN, CWS, Kim RN, BSN Previous Signature: 11/23/2021 5:11:02 PM Version By: Gretta Cool, BSN, RN, CWS, Kim RN, BSN Entered By: Gretta Cool, BSN, RN, CWS, Kim on 11/23/2021 17:11:47 Bear, [name] ([id number]) -------------------------------------------------------------------------------- Clinic Level of Care Assessment Details Patient Name: Herbert Molina. Date of Service:  11/23/2021 3:30 PM Medical Record Number: [id number] Patient Account Number: [id number] Date of Birth/Sex: Jul 30, 1941 (81 y.o. M) Treating RN: Cornell Barman Primary Care Eion Timbrook: Fulton Reek Other Clinician: Referring Norine Reddington: Fulton Reek Treating Sheylin Scharnhorst/Extender: Skipper Cliche in Treatment: 0 Clinic Level of Care Assessment Items TOOL 2 Quantity Score []  - Use when only an EandM is performed on the INITIAL visit 0 ASSESSMENTS - Nursing Assessment / Reassessment X - General Physical Exam (combine w/ comprehensive assessment (listed just below) when performed on new 1 20 pt. evals) X- 1 25 Comprehensive Assessment (HX, ROS, Risk Assessments, Wounds Hx, etc.) ASSESSMENTS - Wound and Skin Assessment / Reassessment X - Simple Wound Assessment / Reassessment - one wound 1 5 []  - 0 Complex Wound Assessment / Reassessment - multiple wounds []  - 0 Dermatologic / Skin Assessment (not related to wound area) ASSESSMENTS - Ostomy and/or Continence Assessment and Care []  - Incontinence Assessment and Management 0 []  - 0 Ostomy Care Assessment and Management (repouching, etc.) PROCESS - Coordination of Care X - Simple Patient / Family Education for ongoing care 1 15 []  - 0 Complex (extensive) Patient / Family Education for ongoing care X- 1 10 Staff obtains Programmer, systems, Records, Test Results / Process Orders []  - 0 Staff telephones HHA, Nursing Homes / Clarify orders / etc []  - 0 Routine Transfer to another Facility (non-emergent condition) []  - 0 Routine Hospital Admission (non-emergent condition) X- 1 15 New Admissions / Biomedical engineer / Ordering NPWT, Apligraf, etc. []  - 0 Emergency Hospital Admission (emergent condition) X- 1 10 Simple Discharge Coordination []  - 0 Complex (extensive) Discharge Coordination PROCESS - Special Needs []  - Pediatric / Minor Patient Management 0 []  - 0 Isolation Patient Management []  - 0 Hearing / Language / Visual special  needs []  - 0 Assessment of Community assistance (transportation, D/C planning, etc.) []  - 0 Additional assistance / Altered mentation []  - 0 Support Surface(s) Assessment (bed, cushion, seat, etc.) INTERVENTIONS - Wound Cleansing / Measurement X - Wound Imaging (photographs - any number of wounds) 1  5 []  - 0 Wound Tracing (instead of photographs) X- 1 5 Simple Wound Measurement - one wound []  - 0 Complex Wound Measurement - multiple wounds Lipford, Ibrahim L. ([id number]) X- 1 5 Simple Wound Cleansing - one wound []  - 0 Complex Wound Cleansing - multiple wounds INTERVENTIONS - Wound Dressings X - Small Wound Dressing one or multiple wounds 1 10 []  - 0 Medium Wound Dressing one or multiple wounds []  - 0 Large Wound Dressing one or multiple wounds []  - 0 Application of Medications - injection INTERVENTIONS - Miscellaneous []  - External ear exam 0 []  - 0 Specimen Collection (cultures, biopsies, blood, body fluids, etc.) []  - 0 Specimen(s) / Culture(s) sent or taken to Lab for analysis []  - 0 Patient Transfer (multiple staff / Civil Service fast streamer / Similar devices) []  - 0 Simple Staple / Suture removal (25 or less) []  - 0 Complex Staple / Suture removal (26 or more) []  - 0 Hypo / Hyperglycemic Management (close monitor of Blood Glucose) []  - 0 Ankle / Brachial Index (ABI) - do not check if billed separately Has the patient been seen at the hospital within the last three years: Yes Total Score: 125 Level Of Care: New/Established - Level 4 Electronic Signature(s) Signed: 11/23/2021 5:11:02 PM By: Gretta Cool, BSN, RN, CWS, Kim RN, BSN Entered By: Gretta Cool, BSN, RN, CWS, Kim on 11/23/2021 15:52:14 Crounse, [name] ([id number]) -------------------------------------------------------------------------------- Encounter Discharge Information Details Patient Name: Herbert Molina. Date of Service: 11/23/2021 3:30 PM Medical Record Number: [id number] Patient Account Number: [id number] Date of  Birth/Sex: 12-31-1940 (81 y.o. M) Treating RN: Cornell Barman Primary Care Keirston Saephanh: Fulton Reek Other Clinician: Referring Odessia Asleson: Fulton Reek Treating Hakan Nudelman/Extender: Skipper Cliche in Treatment: 0 Encounter Discharge Information Items Discharge Condition: Stable Ambulatory Status: Ambulatory Discharge Destination: Home Transportation: Private Auto Accompanied By: self Schedule Follow-up Appointment: Yes Clinical Summary of Care: Electronic Signature(s) Signed: 11/23/2021 5:11:02 PM By: Gretta Cool, BSN, RN, CWS, Kim RN, BSN Entered By: Gretta Cool, BSN, RN, CWS, Kim on 11/23/2021 15:53:40 Emert, [name] ([id number]) -------------------------------------------------------------------------------- Lower Extremity Assessment Details Patient Name: Herbert Molina. Date of Service: 11/23/2021 3:30 PM Medical Record Number: [id number] Patient Account Number: [id number] Date of Birth/Sex: [birth year] (81 y.o. M) Treating RN: Cornell Barman Primary Care Salomon Ganser: Fulton Reek Other Clinician: Referring Vivan Vanderveer: Fulton Reek Treating Adalaya Irion/Extender: Suella Grove in Treatment: 0 Electronic Signature(s) Signed: 11/23/2021 5:11:02 PM By: Gretta Cool, BSN, RN, CWS, Kim RN, BSN Entered By: Gretta Cool, BSN, RN, CWS, Kim on 11/23/2021 15:35:07 Marschner, [name] ([id number]) -------------------------------------------------------------------------------- Multi Wound Chart Details Patient Name: Herbert Molina. Date of Service: 11/23/2021 3:30 PM Medical Record Number: [id number] Patient Account Number: [id number] Date of Birth/Sex: [birth year] (81 y.o. M) Treating RN: Cornell Barman Primary Care Jameal Razzano: Fulton Reek Other Clinician: Referring Esperanza Madrazo: Fulton Reek Treating Zameer Borman/Extender: Skipper Cliche in Treatment: 0 Vital Signs Height(in): 68 Pulse(bpm): 53 Weight(lbs): 180 Blood Pressure(mmHg): 128/75 Body Mass Index(BMI): 27.4 Temperature(F): 98.7 Respiratory Rate(breaths/min):  16 Photos: [N/A:N/A] Wound Location: Right, Anterior Forehead N/A N/A Wounding Event: Trauma N/A N/A Primary Etiology: Trauma, Other N/A N/A Date Acquired: 11/21/2021 N/A N/A Weeks of Treatment: 0 N/A N/A Wound Status: Open N/A N/A Wound Recurrence: No N/A N/A Measurements L x W x D (cm) 0.2x3.2x0.1 N/A N/A Area (cm) : 0.503 N/A N/A Volume (cm) : 0.05 N/A N/A Classification: Partial Thickness N/A N/A Exudate Amount: Medium N/A N/A Exudate Type: Sanguinous N/A N/A Exudate Color: red N/A N/A Granulation Amount: None Present (0%) N/A N/A Necrotic Amount: None Present (0%) N/A  N/A Exposed Structures: Fascia: No N/A N/A Fat Layer (Subcutaneous Tissue): No Tendon: No Muscle: No Joint: No Bone: No Limited to Skin Breakdown Epithelialization: None N/A N/A Treatment Notes Wound #1 (Forehead) Wound Laterality: Right, Anterior Cleanser Normal Saline Discharge Instruction: Wash your hands with soap and water. Remove old dressing, discard into plastic bag and place into trash. Cleanse the wound with Normal Saline prior to applying a clean dressing using gauze sponges, not tissues or cotton balls. Do not scrub or use excessive force. Pat dry using gauze sponges, not tissue or cotton balls. Peri-Wound Care Topical Primary Dressing GERAL, COKER ([id number]) steri-strips Secondary Dressing ABD Pad 5x9 (in/in) Discharge Instruction: Cover with ABD pad Secured With Conforming Stretch Gauze Bandage 4x75 (in/in) Discharge Instruction: Apply as directed Compression Wrap Compression Stockings Add-Ons Stretch Net Size 5 10 (yds) Discharge Instruction: Place over dressing to secure into place. Electronic Signature(s) Signed: 11/23/2021 5:12:30 PM By: Gretta Cool, BSN, RN, CWS, Kim RN, BSN Previous Signature: 11/23/2021 5:11:02 PM Version By: Gretta Cool, BSN, RN, CWS, Kim RN, BSN Entered By: Gretta Cool, BSN, RN, CWS, Kim on 11/23/2021 17:12:30 Bevans, [name]  ([id number]) -------------------------------------------------------------------------------- [place] Details Patient Name: TRAYVEON, BECKFORD. Date of Service: 11/23/2021 3:30 PM Medical Record Number: [id number] Patient Account Number: [id number] Date of Birth/Sex: [birth year] (81 y.o. M) Treating RN: Cornell Barman Primary Care Madesyn Ast: Fulton Reek Other Clinician: Referring Surah Pelley: Fulton Reek Treating Zaylei Mullane/Extender: Skipper Cliche in Treatment: 0 Active Inactive Orientation to the Wound Care Program Nursing Diagnoses: Knowledge deficit related to the wound healing center program Goals: Patient/caregiver will verbalize understanding of the [facility] Date Initiated: 11/23/2021 Target Resolution Date: 11/23/2021 Goal Status: Active Interventions: Provide education on orientation to the wound center Notes: Wound/Skin Impairment Nursing Diagnoses: Impaired tissue integrity Goals: Patient/caregiver will verbalize understanding of skin care regimen Date Initiated: 11/23/2021 Target Resolution Date: 11/23/2021 Goal Status: Active Ulcer/skin breakdown will have a volume reduction of 30% by week 4 Date Initiated: 11/23/2021 Target Resolution Date: 12/21/2021 Goal Status: Active Interventions: Assess ulceration(s) every visit Provide education on ulcer and skin care Treatment Activities: Skin care regimen initiated : 11/23/2021 Notes: Electronic Signature(s) Signed: 11/23/2021 5:11:35 PM By: Gretta Cool, BSN, RN, CWS, Kim RN, BSN Previous Signature: 11/23/2021 5:11:02 PM Version By: Gretta Cool, BSN, RN, CWS, Kim RN, BSN Entered By: Gretta Cool, BSN, RN, CWS, Kim on 11/23/2021 17:11:35 Dorer, [name] ([id number]) -------------------------------------------------------------------------------- Pain Assessment Details Patient Name: Herbert Molina. Date of Service: 11/23/2021 3:30 PM Medical Record Number: [id number] Patient Account Number:  [id number] Date of Birth/Sex: 08/28/1941 (81 y.o. M) Treating RN: Cornell Barman Primary Care Winslow Ederer: Fulton Reek Other Clinician: Referring Edie Darley: Fulton Reek Treating Aretha Levi/Extender: Skipper Cliche in Treatment: 0 Active Problems Location of Pain Severity and Description of Pain Patient Has Paino No Site Locations Rate the pain. Current Pain Level: 4 Character of Pain Describe the Pain: Burning, Tender Pain Management and Medication Current Pain Management: Electronic Signature(s) Signed: 11/23/2021 5:11:55 PM By: Gretta Cool, BSN, RN, CWS, Kim RN, BSN Previous Signature: 11/23/2021 5:11:02 PM Version By: Gretta Cool, BSN, RN, CWS, Kim RN, BSN Entered By: Gretta Cool, BSN, RN, CWS, Kim on 11/23/2021 17:11:55 Kaltenbach, [name] ([id number]) -------------------------------------------------------------------------------- Patient/Caregiver Education Details Patient Name: Herbert Molina Date of Service: 11/23/2021 3:30 PM Medical Record Number: [id number] Patient Account Number: [id number] Date of Birth/Gender: Jun 19, 1941 (81 y.o. M) Treating RN: Cornell Barman Primary Care Physician: Fulton Reek Other Clinician: Referring Physician: Fulton Reek Treating Physician/Extender: Skipper Cliche in Treatment: 0 Education Assessment Education  Provided To: Patient Education Topics Provided Welcome To [facility]: Handouts: Welcome To [facility] Methods: Demonstration, Explain/Verbal Responses: State content correctly Wound/Skin Impairment: Handouts: Caring for Your Ulcer, Other: Change dressing as needed Methods: Demonstration, Explain/Verbal Responses: State content correctly Electronic Signature(s) Signed: 11/23/2021 5:11:02 PM By: Gretta Cool, BSN, RN, CWS, Kim RN, BSN Entered By: Gretta Cool, BSN, RN, CWS, Kim on 11/23/2021 15:52:50 Blau, [name] ([id number]) -------------------------------------------------------------------------------- Wound Assessment  Details Patient Name: Herbert Molina. Date of Service: 11/23/2021 3:30 PM Medical Record Number: [id number] Patient Account Number: [id number] Date of Birth/Sex: 07-May-1941 (81 y.o. M) Treating RN: Cornell Barman Primary Care Yaris Ferrell: Fulton Reek Other Clinician: Referring Paymon Rosensteel: Fulton Reek Treating Caelen Higinbotham/Extender: Suella Grove in Treatment: 0 Wound Status Wound Number: 1 Primary Etiology: Trauma, Other Wound Location: Right, Anterior Forehead Wound Status: Open Wounding Event: Trauma Date Acquired: 11/21/2021 Weeks Of Treatment: 0 Clustered Wound: No Photos Photo Uploaded By: Gretta Cool, BSN, RN, CWS, Kim on 11/23/2021 15:47:33 Wound Measurements Length: (cm) 0.2 Width: (cm) 3.2 Depth: (cm) 0.1 Area: (cm) 0.503 Volume: (cm) 0.05 % Reduction in Area: % Reduction in Volume: Epithelialization: None Undermining: No Wound Description Classification: Partial Thickness Exudate Amount: Medium Exudate Type: Sanguinous Exudate Color: red Foul Odor After Cleansing: No Slough/Fibrino No Wound Bed Granulation Amount: None Present (0%) Exposed Structure Necrotic Amount: None Present (0%) Fascia Exposed: No Fat Layer (Subcutaneous Tissue) Exposed: No Tendon Exposed: No Muscle Exposed: No Joint Exposed: No Bone Exposed: No Limited to Skin Breakdown Treatment Notes Wound #1 (Forehead) Wound Laterality: Right, Anterior Cleanser Normal Saline Discharge Instruction: Wash your hands with soap and water. Remove old dressing, discard into plastic bag and place into trash. Cleanse the wound with Normal Saline prior to applying a clean dressing using gauze sponges, not tissues or cotton balls. Do not Morales, Phyllip L. ([id number]) scrub or use excessive force. Pat dry using gauze sponges, not tissue or cotton balls. Peri-Wound Care Topical Primary Dressing steri-strips Secondary Dressing ABD Pad 5x9 (in/in) Discharge Instruction: Cover with ABD pad Secured With Conforming Stretch  Gauze Bandage 4x75 (in/in) Discharge Instruction: Apply as directed Compression Wrap Compression Stockings Add-Ons Stretch Net Size 5 10 (yds) Discharge Instruction: Place over dressing to secure into place. Electronic Signature(s) Signed: 11/23/2021 5:11:02 PM By: Gretta Cool, BSN, RN, CWS, Kim RN, BSN Entered By: Gretta Cool, BSN, RN, CWS, Kim on 11/23/2021 15:34:17 Garrette, [name] ([id number]) -------------------------------------------------------------------------------- [place] Details Patient Name: Herbert Molina Date of Service: 11/23/2021 3:30 PM Medical Record Number: [id number] Patient Account Number: [id number] Date of Birth/Sex: Feb 26, 1941 (81 y.o. M) Treating RN: Cornell Barman Primary Care Glendell Schlottman: Fulton Reek Other Clinician: Referring Venia Riveron: Fulton Reek Treating Deane Melick/Extender: Skipper Cliche in Treatment: 0 Vital Signs Time Taken: 15:35 Temperature (F): 98.7 Height (in): 68 Pulse (bpm): 68 Weight (lbs): 180 Respiratory Rate (breaths/min): 16 Body Mass Index (BMI): 27.4 Blood Pressure (mmHg): 128/75 Reference Range: 80 - 120 mg / dl Electronic Signature(s) Signed: 11/23/2021 5:12:02 PM By: Gretta Cool, BSN, RN, CWS, Kim RN, BSN Previous Signature: 11/23/2021 5:11:02 PM Version By: Gretta Cool, BSN, RN, CWS, Kim RN, BSN Entered By: Gretta Cool, BSN, RN, CWS, Kim on 11/23/2021 17:12:02

## 2021-11-23 NOTE — Progress Notes (Signed)
DERIC, BOCOCK ([id number]) Visit Report for 11/23/2021 Abuse Risk Screen Details Patient Name: Herbert Molina, Herbert Molina. Date of Service: 11/23/2021 3:30 PM Medical Record Number: [id number] Patient Account Number: [id number] Date of Birth/Sex: 12-25-1940 (81 y.o. M) Treating RN: Cornell Barman Primary Care Earlena Werst: Fulton Reek Other Clinician: Referring Ammiel Guiney: Fulton Reek Treating Richrd Kuzniar/Extender: Suella Grove in Treatment: 0 Abuse Risk Screen Items Answer ABUSE RISK SCREEN: Has anyone close to you tried to hurt or harm you recentlyo No Do you feel uncomfortable with anyone in your familyo No Has anyone forced you do things that you didnot want to doo No Electronic Signature(s) Signed: 11/23/2021 5:11:02 PM By: Gretta Cool, BSN, RN, CWS, Kim RN, BSN Entered By: Gretta Cool, BSN, RN, CWS, Kim on 11/23/2021 15:39:35 Shober, [name] ([id number]) -------------------------------------------------------------------------------- Activities of Daily Living Details Patient Name: Herbert Molina, Herbert L. Date of Service: 11/23/2021 3:30 PM Medical Record Number: [id number] Patient Account Number: [id number] Date of Birth/Sex: 1940-12-26 (81 y.o. M) Treating RN: Cornell Barman Primary Care Taaliyah Delpriore: Fulton Reek Other Clinician: Referring Angelo Prindle: Fulton Reek Treating Howie Rufus/Extender: Suella Grove in Treatment: 0 Activities of Daily Living Items Answer Activities of Daily Living (Please select one for each item) Drive Automobile Completely Able Take Medications Completely Able Use Telephone Completely Able Care for Appearance Completely Able Use Toilet Completely Able Bath / Shower Completely Able Dress Self Completely Able Feed Self Completely Able Walk Completely Able Get In / Out Bed Completely Able Housework Completely Able Prepare Meals Completely Able Handle Money Completely Able Shop for Self Completely Able Electronic Signature(s) Signed: 11/23/2021 5:11:02 PM By: Gretta Cool, BSN, RN, CWS, Kim RN,  BSN Entered By: Gretta Cool, BSN, RN, CWS, Kim on 11/23/2021 15:39:45 Panzer, [name] ([id number]) -------------------------------------------------------------------------------- Education Screening Details Patient Name: Herbert Molina. Date of Service: 11/23/2021 3:30 PM Medical Record Number: [id number] Patient Account Number: [id number] Date of Birth/Sex: 1941/05/12 (81 y.o. M) Treating RN: Cornell Barman Primary Care Donovyn Guidice: Fulton Reek Other Clinician: Referring Ridhaan Dreibelbis: Fulton Reek Treating Doneen Ollinger/Extender: Suella Grove in Treatment: 0 Primary Learner Assessed: Patient Learning Preferences/Education Level/Primary Language Learning Preference: Explanation Highest Education Level: High School Preferred Language: English Cognitive Barrier Language Barrier: No Translator Needed: No Memory Deficit: No Emotional Barrier: No Cultural/Religious Beliefs Affecting Medical Care: No Knowledge/Comprehension Knowledge Level: High Comprehension Level: High Ability to understand written instructions: High Ability to understand verbal instructions: High Motivation Anxiety Level: Calm Cooperation: Cooperative Education Importance: Acknowledges Need Interest in Health Problems: Asks Questions Perception: Coherent Willingness to Engage in Self-Management High Activities: Readiness to Engage in Self-Management High Activities: Electronic Signature(s) Signed: 11/23/2021 5:11:02 PM By: Gretta Cool, BSN, RN, CWS, Kim RN, BSN Entered By: Gretta Cool, BSN, RN, CWS, Kim on 11/23/2021 15:40:11 Mcneill, [name] ([id number]) -------------------------------------------------------------------------------- Fall Risk Assessment Details Patient Name: Herbert Molina Date of Service: 11/23/2021 3:30 PM Medical Record Number: [id number] Patient Account Number: [id number] Date of Birth/Sex: December 22, 1940 (81 y.o. M) Treating RN: Cornell Barman Primary Care Isabelle Matt: Fulton Reek Other Clinician: Referring Russ Looper:  Fulton Reek Treating Lorren Rossetti/Extender: Suella Grove in Treatment: 0 Fall Risk Assessment Items Have you had 2 or more falls in the last 12 monthso 0 No Have you had any fall that resulted in injury in the last 12 monthso 0 No FALLS RISK SCREEN History of falling - immediate or within 3 months 0 No Secondary diagnosis (Do you have 2 or more medical diagnoseso) 0 No Ambulatory aid None/bed rest/wheelchair/nurse 0 Yes Crutches/cane/walker 0 No Furniture 0 No Intravenous therapy Access/Saline/Heparin Lock 0 No Gait/Transferring Normal/ bed rest/ wheelchair 0 Yes Weak (short steps with  or without shuffle, stooped but able to lift head while walking, may 0 No seek support from furniture) Impaired (short steps with shuffle, may have difficulty arising from chair, head down, impaired 0 No balance) Mental Status Oriented to own ability 0 Yes Electronic Signature(s) Signed: 11/23/2021 5:11:02 PM By: Gretta Cool, BSN, RN, CWS, Kim RN, BSN Entered By: Gretta Cool, BSN, RN, CWS, Kim on 11/23/2021 15:43:45 Dorris, [name] ([id number]) -------------------------------------------------------------------------------- Foot Assessment Details Patient Name: Herbert Molina. Date of Service: 11/23/2021 3:30 PM Medical Record Number: [id number] Patient Account Number: [id number] Date of Birth/Sex: 24-Jun-1941 (81 y.o. M) Treating RN: Cornell Barman Primary Care Bleu Minerd: Fulton Reek Other Clinician: Referring Kaymen Adrian: Fulton Reek Treating Melton Walls/Extender: Suella Grove in Treatment: 0 Foot Assessment Items Site Locations + = Sensation present, - = Sensation absent, C = Callus, U = Ulcer R = Redness, W = Warmth, M = Maceration, PU = Pre-ulcerative lesion F = Fissure, S = Swelling, D = Dryness Assessment Right: Left: Other Deformity: No No Prior Foot Ulcer: No No Prior Amputation: No No Charcot Joint: No No Ambulatory Status: Ambulatory Without Help Gait: Steady Electronic Signature(s) Signed: 11/23/2021  5:11:02 PM By: Gretta Cool, BSN, RN, CWS, Kim RN, BSN Entered By: Gretta Cool, BSN, RN, CWS, Kim on 11/23/2021 15:44:12 Pilley, [name] ([id number]) -------------------------------------------------------------------------------- Nutrition Risk Screening Details Patient Name: Herbert Molina. Date of Service: 11/23/2021 3:30 PM Medical Record Number: [id number] Patient Account Number: [id number] Date of Birth/Sex: [birth year] (81 y.o. M) Treating RN: Cornell Barman Primary Care Sudie Bandel: Fulton Reek Other Clinician: Referring Nashayla Telleria: Fulton Reek Treating Hollyn Stucky/Extender: Suella Grove in Treatment: 0 Height (in): 68 Weight (lbs): 180 Body Mass Index (BMI): 27.4 Nutrition Risk Screening Items Score Screening NUTRITION RISK SCREEN: I have an illness or condition that made me change the kind and/or amount of food I eat 0 No I eat fewer than two meals per day 0 No I eat few fruits and vegetables, or milk products 0 No I have three or more drinks of beer, liquor or wine almost every day 0 No I have tooth or mouth problems that make it hard for me to eat 0 No I don't always have enough money to buy the food I need 0 No I eat alone most of the time 0 No I take three or more different prescribed or over-the-counter drugs a day 0 No Without wanting to, I have lost or gained 10 pounds in the last six months 0 No I am not always physically able to shop, cook and/or feed myself 0 No Nutrition Protocols Good Risk Protocol 0 No interventions needed Moderate Risk Protocol High Risk Proctocol Risk Level: Good Risk Score: 0 Electronic Signature(s) Signed: 11/23/2021 5:11:02 PM By: Gretta Cool, BSN, RN, CWS, Kim RN, BSN Entered By: Gretta Cool, BSN, RN, CWS, Kim on 11/23/2021 15:44:00

## 2021-11-23 NOTE — Progress Notes (Signed)
Herbert Molina, Herbert Molina ([id number]) Visit Report for 11/23/2021 Chief Complaint Document Details Patient Name: Herbert Molina, Herbert Molina. Date of Service: 11/23/2021 3:30 PM Medical Record Number: [id number] Patient Account Number: [id number] Date of Birth/Sex: Apr 22, 1941 (81 y.o. M) Treating RN: Primary Care Provider: Fulton Reek Other Clinician: Referring Provider: Fulton Reek Treating Provider/Extender: Skipper Cliche in Treatment: 0 Information Obtained from: Patient Chief Complaint Right forearm laceration Electronic Signature(s) Signed: 11/23/2021 5:10:53 PM By: Worthy Keeler PA-C Entered By: Worthy Keeler on 11/23/2021 17:10:53 [place], Herbert Molina ([id number]) -------------------------------------------------------------------------------- HPI Details Patient Name: Herbert Molina Date of Service: 11/23/2021 3:30 PM Medical Record Number: [id number] Patient Account Number: [id number] Date of Birth/Sex: 1941-06-08 (81 y.o. M) Treating RN: Primary Care Provider: Fulton Reek Other Clinician: Referring Provider: Fulton Reek Treating Provider/Extender: Skipper Cliche in Treatment: 0 History of Present Illness HPI Description: 11/23/2021 patient is actually seen for evaluation here in the clinic today due to the fact that he had a laceration to his right forearm on Saturday, 2 days ago. He tells me this happened at about 4 PM in the evening. He was seen at the urgent care at around roughly the 18-hour mark after that point and they did not really do anything. In fact I do not feel like they treated this appropriately and they should have cleaned the wound and attempted to reapproximate the skin as well as applying Steri-Strips to hold this back in place. Obviously none of this was done. Nonetheless based on what I am seeing currently I feel like that is the main thing that I would suggest doing at this point. I think this will cut down his healing time and will actually help this to  seal up much more rapidly and quickly preventing infection as well. He was given an antibiotic he is going to be picking that up when he leaves here today. He was actually being seen with his wife who is my patient and told me about what was going on and I was concerned about the way this was treated to be perfectly honest. He does have a history of hypertension but no other major medical problems that would affect wound healing at this point he is on aspirin only as a blood thinner. Electronic Signature(s) Signed: 11/23/2021 5:12:12 PM By: Worthy Keeler PA-C Entered By: Worthy Keeler on 11/23/2021 17:12:12 Herbert Molina, Herbert Molina ([id number]) -------------------------------------------------------------------------------- Physical Exam Details Patient Name: Herbert Molina. Date of Service: 11/23/2021 3:30 PM Medical Record Number: [id number] Patient Account Number: [id number] Date of Birth/Sex: Dec 13, 1940 (81 y.o. M) Treating RN: Cornell Barman Primary Care Provider: Fulton Reek Other Clinician: Referring Provider: Fulton Reek Treating Provider/Extender: Skipper Cliche in Treatment: 0 Constitutional sitting or standing blood pressure is within target range for patient.. pulse regular and within target range for patient.Marland Kitchen respirations regular, non- labored and within target range for patient.Marland Kitchen temperature within target range for patient.. Well-nourished and well-hydrated in no acute distress. Eyes conjunctiva clear no eyelid edema noted. pupils equal round and reactive to light and accommodation. Ears, Nose, Mouth, and Throat no gross abnormality of ear auricles or external auditory canals. normal hearing noted during conversation. mucus membranes moist. Respiratory normal breathing without difficulty. Musculoskeletal normal gait and posture. no significant deformity or arthritic changes, no loss or range of motion, no clubbing. Psychiatric this patient is able to make decisions and  demonstrates good insight into disease process. Alert and Oriented x 3. pleasant and cooperative. Notes Upon inspection patient did have a laceration  that unfortunately resulted in a skin tear. The good news is that this does not appear to be deep and does not appear to be too large I think with appropriate reapproximation were actually going to be able to get this to heal much more effectively and quickly. The patient voiced understanding and is in agreement with giving this a trial here as well. I therefore did after cleaning the wound and reapproximate the tissue in order to Steri-Strip this down and I think this is good to allow it to reattach and heal quite nicely. He has been using mupirocin so it is quite clean and to be honest he also had a Telfa dressing which was doing extremely well. Electronic Signature(s) Signed: 11/23/2021 5:13:08 PM By: Worthy Keeler PA-C Entered By: Worthy Keeler on 11/23/2021 17:13:07 Herbert Molina, Herbert Molina ([id number]) -------------------------------------------------------------------------------- Physician Orders Details Patient Name: Herbert Molina Date of Service: 11/23/2021 3:30 PM Medical Record Number: [id number] Patient Account Number: [id number] Date of Birth/Sex: [birth year] (81 y.o. M) Treating RN: Cornell Barman Primary Care Provider: Fulton Reek Other Clinician: Referring Provider: Fulton Reek Treating Provider/Extender: Skipper Cliche in Treatment: 0 Verbal / Phone Orders: No Diagnosis Coding Follow-up Appointments o Return Appointment in 1 week. Wound Treatment Wound #1 - Forehead Wound Laterality: Right, Anterior Cleanser: Normal Saline (Home Health) PRN/7 Days Discharge Instructions: Wash your hands with soap and water. Remove old dressing, discard into plastic bag and place into trash. Cleanse the wound with Normal Saline prior to applying a clean dressing using gauze sponges, not tissues or cotton balls. Do not scrub or use  excessive force. Pat dry using gauze sponges, not tissue or cotton balls. Primary Dressing: steri-strips PRN/7 Days Secondary Dressing: ABD Pad 5x9 (in/in) PRN/7 Days Discharge Instructions: Cover with ABD pad Secured With: Conforming Stretch Gauze Bandage 4x75 (in/in) PRN/7 Days Discharge Instructions: Apply as directed Add-Ons: Stretch Net Size 5 10 (yds) PRN/7 Days Discharge Instructions: Place over dressing to secure into place. Electronic Signature(s) Signed: 11/23/2021 5:11:02 PM By: Gretta Cool, BSN, RN, CWS, Kim RN, BSN Signed: 11/23/2021 5:14:22 PM By: Worthy Keeler PA-C Entered By: Gretta Cool, BSN, RN, CWS, Kim on 11/23/2021 15:50:55 Herbert Molina, Herbert Molina ([id number]) -------------------------------------------------------------------------------- Problem List Details Patient Name: Herbert Molina, Herbert Molina. Date of Service: 11/23/2021 3:30 PM Medical Record Number: [id number] Patient Account Number: [id number] Date of Birth/Sex: 30-Jun-1941 (81 y.o. M) Treating RN: Primary Care Provider: Fulton Reek Other Clinician: Referring Provider: Fulton Reek Treating Provider/Extender: Skipper Cliche in Treatment: 0 Active Problems ICD-10 Encounter Code Description Active Date MDM Diagnosis S51.811A Laceration without foreign body of right forearm, initial encounter 11/23/2021 No Yes I10 Essential (primary) hypertension 11/23/2021 No Yes Inactive Problems Resolved Problems Electronic Signature(s) Signed: 11/23/2021 5:10:38 PM By: Worthy Keeler PA-C Entered By: Worthy Keeler on 11/23/2021 17:10:38 Herbert Molina, Herbert Molina ([id number]) -------------------------------------------------------------------------------- Progress Note Details Patient Name: Herbert Molina. Date of Service: 11/23/2021 3:30 PM Medical Record Number: [id number] Patient Account Number: [id number] Date of Birth/Sex: [birth year] (81 y.o. M) Treating RN: Cornell Barman Primary Care Provider: Fulton Reek Other Clinician: Referring  Provider: Fulton Reek Treating Provider/Extender: Skipper Cliche in Treatment: 0 Subjective Chief Complaint Information obtained from Patient Right forearm laceration History of Present Illness (HPI) 11/23/2021 patient is actually seen for evaluation here in the clinic today due to the fact that he had a laceration to his right forearm on Saturday, 2 days ago. He tells me this happened at about 4 PM in the evening. He was seen at the  urgent care at around roughly the 18-hour mark after that point and they did not really do anything. In fact I do not feel like they treated this appropriately and they should have cleaned the wound and attempted to reapproximate the skin as well as applying Steri-Strips to hold this back in place. Obviously none of this was done. Nonetheless based on what I am seeing currently I feel like that is the main thing that I would suggest doing at this point. I think this will cut down his healing time and will actually help this to seal up much more rapidly and quickly preventing infection as well. He was given an antibiotic he is going to be picking that up when he leaves here today. He was actually being seen with his wife who is my patient and told me about what was going on and I was concerned about the way this was treated to be perfectly honest. He does have a history of hypertension but no other major medical problems that would affect wound healing at this point he is on aspirin only as a blood thinner. Patient History Information obtained from Chart. Allergies No Known Drug Allergies Social History Never smoker, Marital Status - Married, Alcohol Use - Never, Drug Use - No History, Caffeine Use - Daily. Medical History Hematologic/Lymphatic Patient has history of Anemia Cardiovascular Patient has history of Hypertension Medical And Surgical History Notes Constitutional Symptoms (General Health) Basal Cell Carcinoma Right Medial Forehead, Anemia,  Arthritis, BPH, GERD, Hx hiatal hernia, Hypertension, Vertigo Review of Systems (ROS) Integumentary (Skin) Complains or has symptoms of Wounds. Objective Constitutional sitting or standing blood pressure is within target range for patient.. pulse regular and within target range for patient.Marland Kitchen respirations regular, non- labored and within target range for patient.Marland Kitchen temperature within target range for patient.. Well-nourished and well-hydrated in no acute distress. Vitals Time Taken: 3:35 PM, Height: 68 in, Weight: 180 lbs, BMI: 27.4, Temperature: 98.7 F, Pulse: 68 bpm, Respiratory Rate: 16 breaths/min, Blood Pressure: 128/75 mmHg. Eyes conjunctiva clear no eyelid edema noted. pupils equal round and reactive to light and accommodation. Ears, Nose, Mouth, and Throat no gross abnormality of ear auricles or external auditory canals. normal hearing noted during conversation. mucus membranes moist. Herbert Molina, Herbert L. ([id number]) Respiratory normal breathing without difficulty. Musculoskeletal normal gait and posture. no significant deformity or arthritic changes, no loss or range of motion, no clubbing. Psychiatric this patient is able to make decisions and demonstrates good insight into disease process. Alert and Oriented x 3. pleasant and cooperative. General Notes: Upon inspection patient did have a laceration that unfortunately resulted in a skin tear. The good news is that this does not appear to be deep and does not appear to be too large I think with appropriate reapproximation were actually going to be able to get this to heal much more effectively and quickly. The patient voiced understanding and is in agreement with giving this a trial here as well. I therefore did after cleaning the wound and reapproximate the tissue in order to Steri-Strip this down and I think this is good to allow it to reattach and heal quite nicely. He has been using mupirocin so it is quite clean and to be honest he  also had a Telfa dressing which was doing extremely well. Integumentary (Hair, Skin) Wound #1 status is Open. Original cause of wound was Trauma. The date acquired was: 11/21/2021. The wound is located on the Right,Anterior Forehead. The wound measures 0.2cm length x 3.2cm  width x 0.1cm depth; 0.503cm^2 area and 0.05cm^3 volume. The wound is limited to skin breakdown. There is no undermining noted. There is a medium amount of sanguinous drainage noted. There is no granulation within the wound bed. There is no necrotic tissue within the wound bed. Assessment Active Problems ICD-10 Laceration without foreign body of right forearm, initial encounter Essential (primary) hypertension Plan Follow-up Appointments: Return Appointment in 1 week. WOUND #1: - Forehead Wound Laterality: Right, Anterior Cleanser: Normal Saline (Home Health) PRN/7 Days Discharge Instructions: Wash your hands with soap and water. Remove old dressing, discard into plastic bag and place into trash. Cleanse the wound with Normal Saline prior to applying a clean dressing using gauze sponges, not tissues or cotton balls. Do not scrub or use excessive force. Pat dry using gauze sponges, not tissue or cotton balls. Primary Dressing: steri-strips PRN/7 Days Secondary Dressing: ABD Pad 5x9 (in/in) PRN/7 Days Discharge Instructions: Cover with ABD pad Secured With: Conforming Stretch Gauze Bandage 4x75 (in/in) PRN/7 Days Discharge Instructions: Apply as directed Add-Ons: Stretch Net Size 5 10 (yds) PRN/7 Days Discharge Instructions: Place over dressing to secure into place. 1. I would recommend currently that the patient go ahead and keep the [place] in place [place] see him back next week my opinion is this is probably can to be sealed and closed by that time. 2. I am also can recommend that the patient should continue to cover this with an ABD pad and stretch gauze along with stretch net to secure in place. 3. If he has  any increased pain, drainage or otherwise he should let me know soon as possible. We will see patient back for reevaluation in 1 week here in the clinic. If anything worsens or changes patient will contact our office for additional recommendations. Electronic Signature(s) Signed: 11/23/2021 5:13:43 PM By: Worthy Keeler PA-C Entered By: Worthy Keeler on 11/23/2021 17:13:43 Herbert Molina, Herbert Molina ([id number]) -------------------------------------------------------------------------------- ROS/PFSH Details Patient Name: Herbert Molina Date of Service: 11/23/2021 3:30 PM Medical Record Number: [id number] Patient Account Number: [id number] Date of Birth/Sex: Aug 10, 1941 (81 y.o. M) Treating RN: Cornell Barman Primary Care Provider: Fulton Reek Other Clinician: Referring Provider: Fulton Reek Treating Provider/Extender: Suella [place] in Treatment: 0 Information Obtained From Chart Integumentary (Skin) Complaints and Symptoms: Positive for: Wounds Constitutional Symptoms (General Health) Medical History: Past Medical History Notes: Basal Cell Carcinoma Right Medial Forehead, Anemia, Arthritis, BPH, GERD, Hx hiatal hernia, Hypertension, Vertigo Hematologic/Lymphatic Medical History: Positive for: Anemia Cardiovascular Medical History: Positive for: Hypertension Immunizations Pneumococcal Vaccine: Received Pneumococcal Vaccination: Yes Received Pneumococcal Vaccination On or After 60th Birthday: Yes Implantable Devices None Family and Social History Never smoker; Marital Status - Married; Alcohol Use: Never; Drug Use: No History; Caffeine Use: Daily Electronic Signature(s) Signed: 11/23/2021 5:11:02 PM By: Gretta Cool, BSN, RN, CWS, Kim RN, BSN Entered By: Gretta Cool, BSN, RN, CWS, Kim on 11/23/2021 15:39:29 Herbert Molina, Herbert Molina ([id number]) -------------------------------------------------------------------------------- SuperBill Details Patient Name: Herbert Molina Date of Service: 11/23/2021 Medical  Record Number: [id number] Patient Account Number: [id number] Date of Birth/Sex: 1941-06-07 (81 y.o. M) Treating RN: Cornell Barman Primary Care Provider: Fulton Reek Other Clinician: Referring Provider: Fulton Reek Treating Provider/Extender: Skipper Cliche in Treatment: 0 Diagnosis Coding ICD-10 Codes Code Description [phone] Laceration without foreign body of right forearm, initial encounter I10 Essential (primary) hypertension Facility Procedures CPT4 Code: [id number] Description: 99214 - WOUND CARE VISIT-LEV 4 EST PT Modifier: Quantity: 1 Physician Procedures CPT4 Code: 1950932 Description: 67124 - WC PHYS LEVEL 4 - NEW PT Modifier:  Quantity: 1 CPT4 Code: Description: ICD-10 Diagnosis Description X18.550Z Laceration without foreign body of right forearm, initial encounter I10 Essential (primary) hypertension Modifier: Quantity: Electronic Signature(s) Signed: 11/23/2021 5:14:06 PM By: Worthy Keeler PA-C Entered By: Worthy Keeler on 11/23/2021 17:14:06

## 2021-11-30 ENCOUNTER — Other Ambulatory Visit: Payer: Self-pay

## 2021-11-30 ENCOUNTER — Encounter: Payer: Medicare HMO | Admitting: Physician Assistant

## 2021-11-30 DIAGNOSIS — L98491 Non-pressure chronic ulcer of skin of other sites limited to breakdown of skin: Secondary | ICD-10-CM | POA: Diagnosis not present

## 2021-11-30 DIAGNOSIS — S51811A Laceration without foreign body of right forearm, initial encounter: Secondary | ICD-10-CM | POA: Diagnosis not present

## 2021-11-30 DIAGNOSIS — I10 Essential (primary) hypertension: Secondary | ICD-10-CM | POA: Diagnosis not present

## 2021-11-30 NOTE — Progress Notes (Addendum)
Herbert Molina ([id number]) Visit Report for 11/30/2021 Arrival Information Details Patient Name: Herbert Molina, Herbert Molina. Date of Service: 11/30/2021 4:00 PM Medical Record Number: [id number] Patient Account Number: [id number] Date of Birth/Sex: [birth year] (81 y.o. M) Treating RN: Donnamarie Poag Primary Care Ailis Rigaud: Fulton Reek Other Clinician: Referring Monico Sudduth: Fulton Reek Treating Masiyah Engen/Extender: Skipper Cliche in Treatment: 1 Visit Information History Since Last Visit Added or deleted any medications: No Patient Arrived: Ambulatory Had a fall or experienced change in No Arrival Time: 16:03 activities of daily living that may affect Accompanied By: wife risk of falls: Transfer Assistance: None Hospitalized since last visit: No Patient Identification Verified: Yes Has Dressing in Place as Prescribed: Yes Secondary Verification Process Completed: Yes Pain Present Now: No Patient Requires Transmission-Based No Precautions: Patient Has Alerts: Yes Patient Alerts: Patient on Blood Thinner NOT DIABETIC 81mg  aspirin Electronic Signature(s) Signed: 11/30/2021 4:22:58 PM By: Donnamarie Poag Entered By: Donnamarie Poag on 11/30/2021 16:03:39 Herbert Molina ([id number]) -------------------------------------------------------------------------------- Clinic Level of Care Assessment Details Patient Name: Herbert Molina Date of Service: 11/30/2021 4:00 PM Medical Record Number: [id number] Patient Account Number: [id number] Date of Birth/Sex: 12/01/1940 (81 y.o. M) Treating RN: Donnamarie Poag Primary Care Jonie Burdell: Fulton Reek Other Clinician: Referring Oval Moralez: Fulton Reek Treating Sophronia Varney/Extender: Skipper Cliche in Treatment: 1 Clinic Level of Care Assessment Items TOOL 4 Quantity Score []  - Use when only an EandM is performed on FOLLOW-UP visit 0 ASSESSMENTS - Nursing Assessment / Reassessment []  - Reassessment of Co-morbidities (includes updates in patient status)  0 []  - 0 Reassessment of Adherence to Treatment Plan ASSESSMENTS - Wound and Skin Assessment / Reassessment X - Simple Wound Assessment / Reassessment - one wound 1 5 []  - 0 Complex Wound Assessment / Reassessment - multiple wounds []  - 0 Dermatologic / Skin Assessment (not related to wound area) ASSESSMENTS - Focused Assessment []  - Circumferential Edema Measurements - multi extremities 0 []  - 0 Nutritional Assessment / Counseling / Intervention []  - 0 Lower Extremity Assessment (monofilament, tuning fork, pulses) []  - 0 Peripheral Arterial Disease Assessment (using hand held doppler) ASSESSMENTS - Ostomy and/or Continence Assessment and Care []  - Incontinence Assessment and Management 0 []  - 0 Ostomy Care Assessment and Management (repouching, etc.) PROCESS - Coordination of Care X - Simple Patient / Family Education for ongoing care 1 15 []  - 0 Complex (extensive) Patient / Family Education for ongoing care []  - 0 Staff obtains Programmer, systems, Records, Test Results / Process Orders []  - 0 Staff telephones HHA, Nursing Homes / Clarify orders / etc []  - 0 Routine Transfer to another Facility (non-emergent condition) []  - 0 Routine Hospital Admission (non-emergent condition) []  - 0 New Admissions / Biomedical engineer / Ordering NPWT, Apligraf, etc. []  - 0 Emergency Hospital Admission (emergent condition) X- 1 10 Simple Discharge Coordination []  - 0 Complex (extensive) Discharge Coordination PROCESS - Special Needs []  - Pediatric / Minor Patient Management 0 []  - 0 Isolation Patient Management []  - 0 Hearing / Language / Visual special needs []  - 0 Assessment of Community assistance (transportation, D/C planning, etc.) []  - 0 Additional assistance / Altered mentation []  - 0 Support Surface(s) Assessment (bed, cushion, seat, etc.) INTERVENTIONS - Wound Cleansing / Measurement Fishbaugh, [name] ([id number]) X- 1 5 Simple Wound Cleansing - one wound []  - 0 Complex  Wound Cleansing - multiple wounds X- 1 5 Wound Imaging (photographs - any number of wounds) []  - 0 Wound Tracing (instead of photographs) X- 1 5 Simple Wound Measurement -  one wound []  - 0 Complex Wound Measurement - multiple wounds INTERVENTIONS - Wound Dressings X - Small Wound Dressing one or multiple wounds 1 10 []  - 0 Medium Wound Dressing one or multiple wounds []  - 0 Large Wound Dressing one or multiple wounds []  - 0 Application of Medications - topical []  - 0 Application of Medications - injection INTERVENTIONS - Miscellaneous []  - External ear exam 0 []  - 0 Specimen Collection (cultures, biopsies, blood, body fluids, etc.) []  - 0 Specimen(s) / Culture(s) sent or taken to Lab for analysis []  - 0 Patient Transfer (multiple staff / Civil Service fast streamer / Similar devices) []  - 0 Simple Staple / Suture removal (25 or less) []  - 0 Complex Staple / Suture removal (26 or more) []  - 0 Hypo / Hyperglycemic Management (close monitor of Blood Glucose) []  - 0 Ankle / Brachial Index (ABI) - do not check if billed separately X- 1 5 Vital Signs Has the patient been seen at the hospital within the last three years: Yes Total Score: 60 Level Of Care: New/Established - Level 2 Electronic Signature(s) Signed: 11/30/2021 4:22:58 PM By: Donnamarie Poag Entered By: Donnamarie Poag on 11/30/2021 16:15:32 Herbert Molina ([id number]) -------------------------------------------------------------------------------- Encounter Discharge Information Details Patient Name: Herbert Molina. Date of Service: 11/30/2021 4:00 PM Medical Record Number: [id number] Patient Account Number: [id number] Date of Birth/Sex: June 16, 1941 (81 y.o. M) Treating RN: Donnamarie Poag Primary Care Henleigh Robello: Fulton Reek Other Clinician: Referring Helvi Royals: Fulton Reek Treating Gladyse Corvin/Extender: Skipper Cliche in Treatment: 1 Encounter Discharge Information Items Discharge Condition: Stable Ambulatory Status:  Ambulatory Discharge Destination: Home Transportation: Private Auto Accompanied By: wife Schedule Follow-up Appointment: No Clinical Summary of Care: Electronic Signature(s) Signed: 11/30/2021 4:22:58 PM By: Donnamarie Poag Entered By: Donnamarie Poag on 11/30/2021 16:16:16 Burich, Alberteen Molina ([id number]) -------------------------------------------------------------------------------- Lower Extremity Assessment Details Patient Name: Herbert Molina. Date of Service: 11/30/2021 4:00 PM Medical Record Number: [id number] Patient Account Number: [id number] Date of Birth/Sex: [birth year] (81 y.o. M) Treating RN: Donnamarie Poag Primary Care Georgi Navarrete: Fulton Reek Other Clinician: Referring Janis Sol: Fulton Reek Treating Micaiah Remillard/Extender: Skipper Cliche in Treatment: 1 Electronic Signature(s) Signed: 11/30/2021 4:22:58 PM By: Donnamarie Poag Entered By: Donnamarie Poag on 11/30/2021 16:13:44 Patalano, Alberteen Molina ([id number]) -------------------------------------------------------------------------------- Multi Wound Chart Details Patient Name: Herbert Molina. Date of Service: 11/30/2021 4:00 PM Medical Record Number: [id number] Patient Account Number: [id number] Date of Birth/Sex: [birth year] (81 y.o. M) Treating RN: Donnamarie Poag Primary Care Tanice Petre: Fulton Reek Other Clinician: Referring Amanpreet Delmont: Fulton Reek Treating Tevis Conger/Extender: Skipper Cliche in Treatment: 1 Vital Signs Height(in): 68 Pulse(bpm): 70 Weight(lbs): 180 Blood Pressure(mmHg): Body Mass Index(BMI): 27.4 Temperature(F): 97.7 Respiratory Rate(breaths/min): 16 Photos: [N/A:N/A] Wound Location: Right, Anterior Forehead N/A N/A Wounding Event: Trauma N/A N/A Primary Etiology: Trauma, Other N/A N/A Comorbid History: Anemia, Hypertension N/A N/A Date Acquired: 11/21/2021 N/A N/A Weeks of Treatment: 1 N/A N/A Wound Status: Healed - Epithelialized N/A N/A Wound Recurrence: No N/A N/A Measurements L x W x D (cm) 0x0x0 N/A  N/A Area (cm) : 0 N/A N/A Volume (cm) : 0 N/A N/A % Reduction in Area: 100.00% N/A N/A % Reduction in Volume: 100.00% N/A N/A Classification: Partial Thickness N/A N/A Exudate Amount: None Present N/A N/A Granulation Amount: None Present (0%) N/A N/A Necrotic Amount: None Present (0%) N/A N/A Exposed Structures: Fascia: No N/A N/A Fat Layer (Subcutaneous Tissue): No Tendon: No Muscle: No Joint: No Bone: No Limited to Skin Breakdown Epithelialization: None N/A N/A Treatment Notes Electronic Signature(s) Signed: 11/30/2021 4:22:58  PM By: Bishop, Joy Entered By: Donnamarie Poag on 11/30/2021 16:14:09 Herbert Molina ([id number]) -------------------------------------------------------------------------------- [place] Details Patient Name: Herbert Molina, Herbert Molina. Date of Service: 11/30/2021 4:00 PM Medical Record Number: [id number] Patient Account Number: [id number] Date of Birth/Sex: 05-23-1941 (81 y.o. M) Treating RN: Donnamarie Poag Primary Care Adelaide Pfefferkorn: Fulton Reek Other Clinician: Referring Liesel Peckenpaugh: Fulton Reek Treating Jonatha Gagen/Extender: Skipper Cliche in Treatment: 1 Active Inactive Electronic Signature(s) Signed: 11/30/2021 4:22:58 PM By: Donnamarie Poag Entered By: Donnamarie Poag on 11/30/2021 16:14:00 Nathanson, Alberteen Molina ([id number]) -------------------------------------------------------------------------------- Pain Assessment Details Patient Name: Herbert Molina. Date of Service: 11/30/2021 4:00 PM Medical Record Number: [id number] Patient Account Number: [id number] Date of Birth/Sex: 02/18/1941 (81 y.o. M) Treating RN: Donnamarie Poag Primary Care Earsel Shouse: Fulton Reek Other Clinician: Referring Dayan Kreis: Fulton Reek Treating Farrie Sann/Extender: Skipper Cliche in Treatment: 1 Active Problems Location of Pain Severity and Description of Pain Patient Has Paino No Site Locations Rate the pain. Current Pain Level: 0 Pain Management and  Medication Current Pain Management: Electronic Signature(s) Signed: 11/30/2021 4:22:58 PM By: Donnamarie Poag Entered By: Donnamarie Poag on 11/30/2021 16:12:31 Heavin, Alberteen Molina ([id number]) -------------------------------------------------------------------------------- Patient/Caregiver Education Details Patient Name: Herbert Molina Date of Service: 11/30/2021 4:00 PM Medical Record Number: [id number] Patient Account Number: [id number] Date of Birth/Gender: 11/21/1940 (81 y.o. M) Treating RN: Donnamarie Poag Primary Care Physician: Fulton Reek Other Clinician: Referring Physician: Fulton Reek Treating Physician/Extender: Skipper Cliche in Treatment: 1 Education Assessment Education Provided To: Patient Education Topics Provided Basic Hygiene: Electronic Signature(s) Signed: 11/30/2021 4:22:58 PM By: Donnamarie Poag Entered By: Donnamarie Poag on 11/30/2021 16:15:44 Urbanik, Alberteen Molina ([id number]) -------------------------------------------------------------------------------- Wound Assessment Details Patient Name: Herbert Molina. Date of Service: 11/30/2021 4:00 PM Medical Record Number: [id number] Patient Account Number: [id number] Date of Birth/Sex: 1941/05/03 (81 y.o. M) Treating RN: Donnamarie Poag Primary Care Debany Vantol: Fulton Reek Other Clinician: Referring Aldred Mase: Fulton Reek Treating Hailee Hollick/Extender: Skipper Cliche in Treatment: 1 Wound Status Wound Number: 1 Primary Etiology: Trauma, Other Wound Location: Right, Anterior Forehead Wound Status: Healed - Epithelialized Wounding Event: Trauma Comorbid History: Anemia, Hypertension Date Acquired: 11/21/2021 Weeks Of Treatment: 1 Clustered Wound: No Photos Wound Measurements Length: (cm) 0 Width: (cm) 0 Depth: (cm) 0 Area: (cm) 0 Volume: (cm) 0 % Reduction in Area: 100% % Reduction in Volume: 100% Epithelialization: None Wound Description Classification: Partial Thickness Exudate Amount: None Present Foul  Odor After Cleansing: No Slough/Fibrino No Wound Bed Granulation Amount: None Present (0%) Exposed Structure Necrotic Amount: None Present (0%) Fascia Exposed: No Fat Layer (Subcutaneous Tissue) Exposed: No Tendon Exposed: No Muscle Exposed: No Joint Exposed: No Bone Exposed: No Limited to Skin Breakdown Electronic Signature(s) Signed: 11/30/2021 4:22:58 PM By: Donnamarie Poag Entered By: Donnamarie Poag on 11/30/2021 16:13:13 Gero, Alberteen Molina ([id number]) -------------------------------------------------------------------------------- Vitals Details Patient Name: Herbert Molina. Date of Service: 11/30/2021 4:00 PM Medical Record Number: [id number] Patient Account Number: [id number] Date of Birth/Sex: 12/07/1940 (81 y.o. M) Treating RN: Donnamarie Poag Primary Care Saprina Chuong: Fulton Reek Other Clinician: Referring Benicia Bergevin: Fulton Reek Treating Mehtaab Mayeda/Extender: Skipper Cliche in Treatment: 1 Vital Signs Time Taken: 16:00 Temperature (F): 97.7 Height (in): 68 Pulse (bpm): 70 Weight (lbs): 180 Respiratory Rate (breaths/min): 16 Body Mass Index (BMI): 27.4 Reference Range: 80 - 120 mg / dl Electronic Signature(s) Signed: 11/30/2021 4:22:58 PM By: Donnamarie Poag Entered ByDonnamarie Poag on 11/30/2021 16:12:24

## 2021-11-30 NOTE — Progress Notes (Addendum)
ZALMEN, WRIGHTSMAN ([id number]) Visit Report for 11/30/2021 Chief Complaint Document Details Patient Name: Herbert Molina, Herbert Molina. Date of Service: 11/30/2021 4:00 PM Medical Record Number: [id number] Patient Account Number: [id number] Date of Birth/Sex: [birth year] (81 y.o. M) Treating RN: Donnamarie Poag Primary Care Provider: Fulton Reek Other Clinician: Referring Provider: Fulton Reek Treating Provider/Extender: Skipper Cliche in Treatment: 1 Information Obtained from: Patient Chief Complaint Right forearm laceration Electronic Signature(s) Signed: 11/30/2021 3:52:54 PM By: Worthy Keeler PA-C Entered By: Worthy Keeler on 11/30/2021 15:52:53 [place], [name] ([id number]) -------------------------------------------------------------------------------- HPI Details Patient Name: Herbert Molina Date of Service: 11/30/2021 4:00 PM Medical Record Number: [id number] Patient Account Number: [id number] Date of Birth/Sex: 1941-04-18 (81 y.o. M) Treating RN: Donnamarie Poag Primary Care Provider: Fulton Reek Other Clinician: Referring Provider: Fulton Reek Treating Provider/Extender: Skipper Cliche in Treatment: 1 History of Present Illness HPI Description: 11/23/2021 patient is actually seen for evaluation here in the clinic today due to the fact that he had a laceration to his right forearm on Saturday, 2 days ago. He tells me this happened at about 4 PM in the evening. He was seen at the urgent care at around roughly the 18-hour mark after that point and they did not really do anything. In fact I do not feel like they treated this appropriately and they should have cleaned the wound and attempted to reapproximate the skin as well as applying Steri-Strips to hold this back in place. Obviously none of this was done. Nonetheless based on what I am seeing currently I feel like that is the main thing that I would suggest doing at this point. I think this will cut down his healing time and  will actually help this to seal up much more rapidly and quickly preventing infection as well. He was given an antibiotic he is going to be picking that up when he leaves here today. He was actually being seen with his wife who is my patient and told me about what was going on and I was concerned about the way this was treated to be perfectly honest. He does have a history of hypertension but no other major medical problems that would affect wound healing at this point he is on aspirin only as a blood thinner. 11/30/2021 upon evaluation patient appears to be doing excellent in regard to his wound. In fact this appears to be healed in my opinion the Steri- Strips are still intact and in place for that reason I am just going to leave them in place and have him shower and otherwise do what ever he needs to normally I do not think that there is any point in trying to remove them and this will give a little bit extra time for it to toughen up underneath before the Steri-Strips come off. He voiced understanding. Obviously if anything changes he can always let me know. Electronic Signature(s) Signed: 11/30/2021 4:17:00 PM By: Worthy Keeler PA-C Entered By: Worthy Keeler on 11/30/2021 16:17:00 [place], [name] ([id number]) -------------------------------------------------------------------------------- Physical Exam Details Patient Name: Herbert Molina. Date of Service: 11/30/2021 4:00 PM Medical Record Number: [id number] Patient Account Number: [id number] Date of Birth/Sex: 10-11-1940 (81 y.o. M) Treating RN: Donnamarie Poag Primary Care Provider: Fulton Reek Other Clinician: Referring Provider: Fulton Reek Treating Provider/Extender: Skipper Cliche in Treatment: 1 Constitutional Well-nourished and well-hydrated in no acute distress. Respiratory normal breathing without difficulty. Psychiatric this patient is able to make decisions and demonstrates good insight into disease process. Alert  and Oriented x 3. pleasant and cooperative. Notes Underneath the Steri-Strips the patient's wound appears to be completely healed and I am very pleased with that at this point he is released to normal activity although I would say during the day keeping it covered just for protection is not a bad idea he can leave it open at night. Electronic Signature(s) Signed: 11/30/2021 4:17:16 PM By: Worthy Keeler PA-C Entered By: Worthy Keeler on 11/30/2021 16:17:15 [place], [name] ([id number]) -------------------------------------------------------------------------------- Physician Orders Details Patient Name: Herbert Molina Date of Service: 11/30/2021 4:00 PM Medical Record Number: [id number] Patient Account Number: [id number] Date of Birth/Sex: 1941/07/26 (81 y.o. M) Treating RN: Donnamarie Poag Primary Care Provider: Fulton Reek Other Clinician: Referring Provider: Fulton Reek Treating Provider/Extender: Skipper Cliche in Treatment: 1 Verbal / Phone Orders: No Diagnosis Coding ICD-10 Coding Code Description [phone] Laceration without foreign body of right forearm, initial encounter I10 Essential (primary) hypertension Discharge From [facility] Services o Discharge from [place] Treatment Complete Bathing/ Shower/ Hygiene o Other: - Allow steri strips to fall off and keep newly healed skin covered for protection Electronic Signature(s) Signed: 11/30/2021 4:22:58 PM By: Donnamarie Poag Signed: 11/30/2021 5:31:39 PM By: Worthy Keeler PA-C Entered By: Donnamarie Poag on 11/30/2021 [place], [name] ([id number]) -------------------------------------------------------------------------------- Problem List Details Patient Name: Herbert Molina. Date of Service: 11/30/2021 4:00 PM Medical Record Number: [id number] Patient Account Number: [id number] Date of Birth/Sex: Jan 11, 1941 (81 y.o. M) Treating RN: Donnamarie Poag Primary Care Provider: Fulton Reek Other  Clinician: Referring Provider: Fulton Reek Treating Provider/Extender: Skipper Cliche in Treatment: 1 Active Problems ICD-10 Encounter Code Description Active Date MDM Diagnosis S51.811A Laceration without foreign body of right forearm, initial encounter 11/23/2021 No Yes I10 Essential (primary) hypertension 11/23/2021 No Yes Inactive Problems Resolved Problems Electronic Signature(s) Signed: 11/30/2021 3:52:45 PM By: Worthy Keeler PA-C Entered By: Worthy Keeler on 11/30/2021 15:52:45 [place]ar[place], [name] ([id number]) -------------------------------------------------------------------------------- Progress Note Details Patient Name: Herbert Molina. Date of Service: 11/30/2021 4:00 PM Medical Record Number: [id number] Patient Account Number: [id number] Date of Birth/Sex: 01-20-1941 (81 y.o. M) Treating RN: Donnamarie Poag Primary Care Provider: Fulton Reek Other Clinician: Referring Provider: Fulton Reek Treating Provider/Extender: Skipper Cliche in Treatment: 1 Subjective Chief Complaint Information obtained from Patient Right forearm laceration History of Present Illness (HPI) 11/23/2021 patient is actually seen for evaluation here in the clinic today due to the fact that he had a laceration to his right forearm on Saturday, 2 days ago. He tells me this happened at about 4 PM in the evening. He was seen at the urgent care at around roughly the 18-hour mark after that point and they did not really do anything. In fact I do not feel like they treated this appropriately and they should have cleaned the wound and attempted to reapproximate the skin as well as applying Steri-Strips to hold this back in place. Obviously none of this was done. Nonetheless based on what I am seeing currently I feel like that is the main thing that I would suggest doing at this point. I think this will cut down his healing time and will actually help this to seal up much more rapidly and quickly  preventing infection as well. He was given an antibiotic he is going to be picking that up when he leaves here today. He was actually being seen with his wife who is my patient and told me about what was going on and I was concerned  about the way this was treated to be perfectly honest. He does have a history of hypertension but no other major medical problems that would affect wound healing at this point he is on aspirin only as a blood thinner. 11/30/2021 upon evaluation patient appears to be doing excellent in regard to his wound. In fact this appears to be healed in my opinion the Steri- Strips are still intact and in place for that reason I am just going to leave them in place and have him shower and otherwise do what ever he needs to normally I do not think that there is any point in trying to remove them and this will give a little bit extra time for it to toughen up underneath before the Steri-Strips come off. He voiced understanding. Obviously if anything changes he can always let me know. Objective Constitutional Well-nourished and well-hydrated in no acute distress. Vitals Time Taken: 4:00 PM, Height: 68 in, Weight: 180 lbs, BMI: 27.4, Temperature: 97.7 F, Pulse: 70 bpm, Respiratory Rate: 16 breaths/min. Respiratory normal breathing without difficulty. Psychiatric this patient is able to make decisions and demonstrates good insight into disease process. Alert and Oriented x 3. pleasant and cooperative. General Notes: Underneath the Steri-Strips the patient's wound appears to be completely healed and I am very pleased with that at this point he is released to normal activity although I would say during the day keeping it covered just for protection is not a bad idea he can leave it open at night. Integumentary (Hair, Skin) Wound #1 status is Healed - Epithelialized. Original cause of wound was Trauma. The date acquired was: 11/21/2021. The wound has been in treatment 1 weeks. The wound  is located on the Right,Anterior Forehead. The wound measures 0cm length x 0cm width x 0cm depth; 0cm^2 area and 0cm^3 volume. The wound is limited to skin breakdown. There is a none present amount of drainage noted. There is no granulation within the wound bed. There is no necrotic tissue within the wound bed. Assessment Active Problems ICD-10 Laceration without foreign body of right forearm, initial encounter TIJUAN, DANTES ([id number]) Essential (primary) hypertension Plan Discharge From [facility] Services: Discharge from [place] [facility] Complete Bathing/ Shower/ Hygiene: Other: - Allow steri strips to fall off and keep newly healed skin covered for protection 1. Would recommend currently that we going continue with the recommendation for the patient to monitor for any signs of worsening though I think this is healed he can shower and otherwise operate is normal. 2. He will keep it covered with gauze and stretch net just for protection sake during the day so he does not bump it on anything I think that is appropriate and a good safety measure. We will see patient back for reevaluation in 1 week here in the clinic. If anything worsens or changes patient will contact our office for additional recommendations. Electronic Signature(s) Signed: 11/30/2021 4:17:40 PM By: Worthy Keeler PA-C Entered By: Worthy Keeler on 11/30/2021 16:17:40 [place], [name] ([id number]) -------------------------------------------------------------------------------- SuperBill Details Patient Name: Herbert Molina Date of Service: 11/30/2021 Medical Record Number: [id number] Patient Account Number: [id number] Date of Birth/Sex: 08-Dec-1940 (81 y.o. M) Treating RN: Donnamarie Poag Primary Care Provider: Fulton Reek Other Clinician: Referring Provider: Fulton Reek Treating Provider/Extender: Skipper Cliche in Treatment: 1 Diagnosis Coding ICD-10 Codes Code Description [phone] Laceration  without foreign body of right forearm, initial encounter I10 Essential (primary) hypertension Facility Procedures CPT4 Code: [id number] Description: [phone] - WOUND CARE VISIT-LEV  2 EST PT Modifier: Quantity: 1 Physician Procedures CPT4 Code: 6384536 Description: 46803 - WC PHYS LEVEL 3 - EST PT Modifier: Quantity: 1 CPT4 Code: Description: ICD-10 Diagnosis Description O12.248G Laceration without foreign body of right forearm, initial encounter I10 Essential (primary) hypertension Modifier: Quantity: Electronic Signature(s) Signed: 11/30/2021 4:18:15 PM By: Worthy Keeler PA-C Entered By: Worthy Keeler on 11/30/2021 16:18:14

## 2021-12-24 DIAGNOSIS — Z96651 Presence of right artificial knee joint: Secondary | ICD-10-CM | POA: Diagnosis not present

## 2022-01-07 ENCOUNTER — Ambulatory Visit: Payer: Medicare HMO | Admitting: Dermatology

## 2022-01-25 DIAGNOSIS — M7582 Other shoulder lesions, left shoulder: Secondary | ICD-10-CM | POA: Diagnosis not present

## 2022-01-25 DIAGNOSIS — G8929 Other chronic pain: Secondary | ICD-10-CM | POA: Diagnosis not present

## 2022-01-25 DIAGNOSIS — M25511 Pain in right shoulder: Secondary | ICD-10-CM | POA: Diagnosis not present

## 2022-01-25 DIAGNOSIS — M75121 Complete rotator cuff tear or rupture of right shoulder, not specified as traumatic: Secondary | ICD-10-CM | POA: Diagnosis not present

## 2022-01-25 DIAGNOSIS — M25512 Pain in left shoulder: Secondary | ICD-10-CM | POA: Diagnosis not present

## 2022-01-25 DIAGNOSIS — M12811 Other specific arthropathies, not elsewhere classified, right shoulder: Secondary | ICD-10-CM | POA: Diagnosis not present

## 2022-01-28 ENCOUNTER — Encounter: Payer: Self-pay | Admitting: Dermatology

## 2022-01-28 ENCOUNTER — Ambulatory Visit: Payer: Medicare HMO | Admitting: Dermatology

## 2022-01-28 DIAGNOSIS — L711 Rhinophyma: Secondary | ICD-10-CM | POA: Diagnosis not present

## 2022-01-28 DIAGNOSIS — L720 Epidermal cyst: Secondary | ICD-10-CM

## 2022-01-28 DIAGNOSIS — L578 Other skin changes due to chronic exposure to nonionizing radiation: Secondary | ICD-10-CM

## 2022-01-28 DIAGNOSIS — L820 Inflamed seborrheic keratosis: Secondary | ICD-10-CM | POA: Diagnosis not present

## 2022-01-28 DIAGNOSIS — L738 Other specified follicular disorders: Secondary | ICD-10-CM

## 2022-01-28 DIAGNOSIS — D692 Other nonthrombocytopenic purpura: Secondary | ICD-10-CM

## 2022-01-28 DIAGNOSIS — L719 Rosacea, unspecified: Secondary | ICD-10-CM

## 2022-01-28 MED ORDER — METRONIDAZOLE 0.75 % EX GEL: CUTANEOUS | 2 refills | Status: DC

## 2022-01-28 NOTE — Progress Notes (Signed)
? ?Follow-Up Visit ?  ?Subjective  ?Herbert Molina is a 81 y.o. male who presents for the following: Actinic Keratosis (3 month recheck. Scalp. AK's Tx with LN2. Used 5FU/Calcipotriene to scalp since last visit. Has new area of concern on nose). ?The patient has spots, moles and lesions to be evaluated, some may be new or changing and the patient has concerns that these could be cancer. ?Wife with patient.  ? ?The following portions of the chart were reviewed this encounter and updated as appropriate:  Tobacco  Allergies  Meds  Problems  Med Hx  Surg Hx  Fam Hx   ?  ?Review of Systems: No other skin or systemic complaints except as noted in HPI or Assessment and Plan. ? ?Objective  ?Well appearing patient in no apparent distress; mood and affect are within normal limits. ? ?All sun exposed areas plus back examined. ? ?Head - Anterior (Face) ?Mid face erythema with telangiectasias +/- scattered inflammatory papules.  ? ?Right Forearm - Posterior x2, left forearm x1 (3) ?Erythematous keratotic or waxy stuck-on papule or plaque. ? ?Right Posterior Base of Neck ?1.2 cm cystic nodule ? ? ?Assessment & Plan  ? ?Actinic Damage ?- chronic, secondary to cumulative UV radiation exposure/sun exposure over time ?- diffuse scaly erythematous macules with underlying dyspigmentation ?- Recommend daily broad spectrum sunscreen SPF 30+ to sun-exposed areas, reapply every 2 hours as needed.  ?- Recommend staying in the shade or wearing long sleeves, sun glasses (UVA+UVB protection) and wide brim hats (4-inch brim around the entire circumference of the hat). ?- Call for new or changing lesions. ? ?Sebaceous Hyperplasia ?- Small yellow papules with a central dell ?- Benign ?- Observe ? ?Purpura - Chronic; persistent and recurrent.  Treatable, but not curable. ?- Violaceous macules and patches ?- Benign ?- Related to trauma, age, sun damage and/or use of blood thinners, chronic use of topical and/or oral steroids ?- Observe ?-  Can use OTC arnica containing moisturizer such as Dermend Bruise Formula if desired ?- Call for worsening or other concerns ? 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 It is treatable but not curable. It sometimes affects the eyes (ocular rosacea) as well. It may respond to topical and/or systemic medication and can flare with stress, sun exposure, alcohol, exercise and some foods.  Daily application of broad spectrum spf 30+ sunscreen to face is recommended to reduce flares. ? ?Start Metronidazole gel at bedtime to face.  ? ?metroNIDAZOLE (METROGEL) 0.75 % gel - Head - Anterior (Face) ?Apply to face at bedtime ? ?Inflamed seborrheic keratosis (3) ?Right Forearm - Posterior x2, left forearm x1 ?Destruction of lesion - Right Forearm - Posterior x2, left forearm x1 ?Complexity: simple   ?Destruction method: cryotherapy   ?Informed consent: discussed and consent obtained   ?Timeout:  patient name, date of birth, surgical site, and procedure verified ?Lesion destroyed using liquid nitrogen: Yes   ?Region frozen until ice ball extended beyond lesion: Yes   ?Outcome: patient tolerated procedure well with no complications   ?Post-procedure details: wound care instructions given   ? ?Epidermal inclusion cyst ?Right Posterior Base of Neck ?Benign-appearing. Exam most consistent with an epidermal inclusion cyst. Discussed that a cyst is a benign growth that can grow over time and sometimes get irritated or inflamed. Recommend observation if it is not bothersome. Discussed option of surgical excision to remove it if  it is growing, symptomatic, or other changes noted. Please call for new or changing lesions so they can be evaluated. ? ?Return in about 6 months (around 07/30/2022) for AK Follow Up, TBSE. ? ?I, Emelia Salisbury, CMA, am acting as scribe for Sarina Ser, MD. ?Documentation: I have  reviewed the above documentation for accuracy and completeness, and I agree with the above. ? ?Sarina Ser, MD ? ? ?

## 2022-01-28 NOTE — Patient Instructions (Addendum)
Face/Rosacea ?Start Metronidazole gel at bedtime to face.  ? ?Cryotherapy Aftercare ? ?Wash gently with soap and water everyday.   ?Apply Vaseline and Band-Aid daily until healed.  ? ?Prior to procedure, discussed risks of blister formation, small wound, skin dyspigmentation, or rare scar following cryotherapy. Recommend Vaseline ointment to treated areas while healing.  ? ? ? ? ?Pre-Operative Instructions ? ?You are scheduled for a surgical procedure at [facility]. We recommend you read the following instructions. If you have any questions or concerns, please call the office at [phone]. ? ?Shower and wash the entire body with soap and water the day of your surgery paying special attention to cleansing at and around the planned surgery site. ? ?Avoid aspirin or aspirin containing products at least fourteen (14) days prior to your surgical procedure and for at least one week (7 Days) after your surgical procedure. If you take aspirin on a regular basis for heart disease or history of stroke or for any other reason, we may recommend you continue taking aspirin but please notify us if you take this on a regular basis. Aspirin can cause more bleeding to occur during surgery as well as prolonged bleeding and bruising after surgery.  ? ?Avoid other nonsteroidal pain medications at least one week prior to surgery and at least one week prior to your surgery. These include medications such as Ibuprofen (Motrin, Advil and Nuprin), Naprosyn, Voltaren, Relafen, etc. If medications are used for therapeutic reasons, please inform us as they can cause increased bleeding or prolonged bleeding during and bruising after surgical procedures.  ? ?Please advise [place] if you are taking any "blood thinner" medications such as Coumadin or Dipyridamole or Plavix or similar medications. These cause increased bleeding and prolonged bleeding during procedures and bruising after surgical procedures. We may have to consider  discontinuing these medications briefly prior to and shortly after your surgery if safe to do so.  ? ?Please inform us of all medications you are currently taking. All medications that are taken regularly should be taken the day of surgery as you always do. Nevertheless, we need to be informed of what medications you are taking prior to surgery to know whether they will affect the procedure or cause any complications.  ? ?Please inform us of any medication allergies. Also inform us of whether you have allergies to Latex or rubber products or whether you have had any adverse reaction to Lidocaine or Epinephrine. ? ?Please inform us of any prosthetic or artificial body parts such as artificial heart valve, joint replacements, etc., or similar condition that might require preoperative antibiotics.  ? ?We recommend avoidance of alcohol at least two weeks prior to surgery and continued avoidance for at least two weeks after surgery.  ? ?We recommend discontinuation of tobacco smoking at least two weeks prior to surgery and continued abstinence for at least two weeks after surgery. ? ?Do not plan strenuous exercise, strenuous work or strenuous lifting for approximately four weeks after your surgery.  ? ?We request if you are unable to make your scheduled surgical appointment, please call us at least a week in advance or as soon as you are aware of a problem so that we can cancel or reschedule the appointment.  ? ?You MAY TAKE TYLENOL (acetaminophen) for pain as it is not a blood thinner.  ? ?PLEASE PLAN TO BE IN TOWN FOR TWO WEEKS FOLLOWING SURGERY, THIS IS IMPORTANT SO YOU CAN BE CHECKED FOR DRESSING CHANGES, SUTURE REMOVAL AND TO  MONITOR FOR POSSIBLE COMPLICATIONS.  ? ?If You Need Anything After Your Visit ? ?If you have any questions or concerns for your doctor, please call our main line at [phone] and press option 4 to reach your doctor's medical assistant. If no one answers, please leave a voicemail as directed  and we will return your call as soon as possible. Messages left after 4 pm will be answered the following business day.  ? ?You may also send [place] a message via MyChart. We typically respond to MyChart messages within 1-2 business days. ? ?For prescription refills, please ask your pharmacy to contact our office. Our fax number is [phone]. ? ?If you have an urgent issue when the clinic is closed that cannot wait until the next business day, you can page your doctor at the number below.   ? ?Please note that while we do our best to be available for urgent issues outside of office hours, we are not available 24/7.  ? ?If you have an urgent issue and are unable to reach [place], you may choose to seek medical care at your doctor's office, retail clinic, urgent care center, or emergency room. ? ?If you have a medical emergency, please immediately call 911 or go to the emergency department. ? ?Pager Numbers ? ?- Dr. Nehemiah Massed: [phone] ? ?- Dr. Laurence Ferrari: [phone] ? ?- Dr. Nicole Kindred: [phone] ? ?In the event of inclement weather, please call our main line at [phone] for an update on the status of any delays or closures. ? ?Dermatology Medication Tips: ?Please keep the boxes that topical medications come in in order to help keep track of the instructions about where and how to use these. Pharmacies typically print the medication instructions only on the boxes and not directly on the medication tubes.  ? ?If your medication is too expensive, please contact our office at [phone] option 4 or send [place] a message through [place].  ? ?We are unable to tell what your co-pay for medications will be in advance as this is different depending on your insurance coverage. However, we may be able to find a substitute medication at lower cost or fill out paperwork to get insurance to cover a needed medication.  ? ?If a prior authorization is required to get your medication covered by your insurance company, please allow [place] 1-2  business days to complete this process. ? ?Drug prices often vary depending on where the prescription is filled and some pharmacies may offer cheaper prices. ? ?The website www.goodrx.com contains coupons for medications through different pharmacies. The prices here do not account for what the cost may be with help from insurance (it may be cheaper with your insurance), but the website can give you the price if you did not use any insurance.  ?- You can print the associated coupon and take it with your prescription to the pharmacy.  ?- You may also stop by our office during regular business hours and pick up a GoodRx coupon card.  ?- If you need your prescription sent electronically to a different pharmacy, notify our office through [facility] or by phone at [phone] option 4. ? ? ? ? ?Si Usted Necesita Algo Despu?s de Su Visita ? ?Tambi?n puede enviarnos un mensaje a trav?s de MyChart. Por lo general respondemos a los mensajes de MyChart en el transcurso de 1 a 2 d?as h?biles. ? ?Para renovar recetas, por favor pida a su farmacia que se ponga en contacto con nuestra oficina. Nuestro n?mero de  fax es el [phone]. ? ?Si tiene un asunto urgente cuando la cl?nica est? cerrada y que no puede esperar hasta el siguiente d?a h?bil, puede llamar/localizar a su doctor(a) al n?mero que aparece a continuaci?n.  ? ?Por favor, tenga en cuenta que aunque hacemos todo lo posible para estar disponibles para asuntos urgentes fuera del horario de oficina, no estamos disponibles las 24 horas del d?a, los 7 d?as de la semana.  ? ?Si tiene un problema urgente y no puede comunicarse con nosotros, puede optar por buscar atenci?n m?dica  en el consultorio de su doctor(a), en una cl?nica privada, en un centro de atenci?n urgente o en una sala de emergencias. ? ?Si tiene Engineer, maintenance (IT) m?dica, por favor llame inmediatamente al 911 o vaya a la sala de emergencias. ? ?N?meros de b?per ? ?- Dr. Nehemiah Massed: [phone] ? ?- Dra.  Moye: [phone] ? ?- Dra. Nicole Kindred: [phone] ? ?En caso de inclemencias del tiempo, por favor llame a nuestra l?nea principal al [phone] para una actualizaci?n sobre el estado de cualquier Shelda Jakes o

## 2022-01-29 DIAGNOSIS — B351 Tinea unguium: Secondary | ICD-10-CM | POA: Diagnosis not present

## 2022-01-29 DIAGNOSIS — M79674 Pain in right toe(s): Secondary | ICD-10-CM | POA: Diagnosis not present

## 2022-01-29 DIAGNOSIS — M79675 Pain in left toe(s): Secondary | ICD-10-CM | POA: Diagnosis not present

## 2022-02-08 ENCOUNTER — Encounter: Payer: Self-pay | Admitting: Dermatology

## 2022-02-12 DIAGNOSIS — M1712 Unilateral primary osteoarthritis, left knee: Secondary | ICD-10-CM | POA: Diagnosis not present

## 2022-02-15 DIAGNOSIS — I10 Essential (primary) hypertension: Secondary | ICD-10-CM | POA: Diagnosis not present

## 2022-02-15 DIAGNOSIS — N138 Other obstructive and reflux uropathy: Secondary | ICD-10-CM | POA: Diagnosis not present

## 2022-02-15 DIAGNOSIS — N401 Enlarged prostate with lower urinary tract symptoms: Secondary | ICD-10-CM | POA: Diagnosis not present

## 2022-02-15 DIAGNOSIS — R7309 Other abnormal glucose: Secondary | ICD-10-CM | POA: Diagnosis not present

## 2022-02-15 DIAGNOSIS — R413 Other amnesia: Secondary | ICD-10-CM | POA: Diagnosis not present

## 2022-02-15 DIAGNOSIS — E782 Mixed hyperlipidemia: Secondary | ICD-10-CM | POA: Diagnosis not present

## 2022-02-15 DIAGNOSIS — Z79899 Other long term (current) drug therapy: Secondary | ICD-10-CM | POA: Diagnosis not present

## 2022-02-25 ENCOUNTER — Ambulatory Visit: Payer: Medicare HMO | Admitting: Dermatology

## 2022-02-25 DIAGNOSIS — B353 Tinea pedis: Secondary | ICD-10-CM

## 2022-02-25 DIAGNOSIS — L719 Rosacea, unspecified: Secondary | ICD-10-CM

## 2022-02-25 DIAGNOSIS — L578 Other skin changes due to chronic exposure to nonionizing radiation: Secondary | ICD-10-CM

## 2022-02-25 MED ORDER — FLUCONAZOLE 200 MG PO TABS: 200.0000 mg | ORAL_TABLET | ORAL | 0 refills | Status: DC

## 2022-02-25 NOTE — Progress Notes (Signed)
   Follow-Up Visit   Subjective  Herbert Molina is a 81 y.o. male who presents for the following: Rash (Bilateral feet - Dr Doy Hutching gave him Clotrimazole/Betamethasone cream). Accompanied by wife Also wants rosacea evaluated. The patient has spots, moles and lesions to be evaluated, some may be new or changing and the patient has concerns that these could be cancer.  The following portions of the chart were reviewed this encounter and updated as appropriate:   Tobacco  Allergies  Meds  Problems  Med Hx  Surg Hx  Fam Hx     Review of Systems:  No other skin or systemic complaints except as noted in HPI or Assessment and Plan.  Objective  Well appearing patient in no apparent distress; mood and affect are within normal limits.  A focused examination was performed including feet, scalp. Relevant physical exam findings are noted in the Assessment and Plan.  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 Condition is bothersome/symptomatic for patient. Currently flared. Liver lab tests normal from 02/15/2022 -reviewed.   Start Fluconazole 200 mg 1 po 3 times per week  fluconazole (DIFLUCAN) 200 MG tablet - Bilateral feet Take 1 tablet (200 mg total) by mouth 3 (three) times a week.  Rosacea Scalp Rosacea is a chronic progressive skin condition usually affecting the face of adults, causing redness and/or acne bumps. It is treatable but not curable. It sometimes affects the eyes (ocular rosacea) as well. It may respond to topical and/or systemic medication and can flare with stress, sun exposure, alcohol, exercise and some foods.  Daily application of broad spectrum spf 30+ sunscreen to face is recommended to reduce flares. Chronic and persistent condition with duration or expected duration over one year. Condition is bothersome/symptomatic for patient.  Currently flared.  Continue Metronidazole 0.75% gel qd to scalp   Related Medications metroNIDAZOLE (METROGEL) 0.75 % gel Apply to face at bedtime  Actinic Damage - chronic, secondary to cumulative UV radiation exposure/sun exposure over time - diffuse scaly erythematous macules with underlying dyspigmentation - Recommend daily broad spectrum sunscreen SPF 30+ to sun-exposed areas, reapply every 2 hours as needed.  - Recommend staying in the shade or wearing long sleeves, sun glasses (UVA+UVB protection) and wide brim hats (4-inch brim around the entire circumference of the hat). - Call for new or changing lesions.  Return for Follow up as scheduled.  I, Ashok Cordia, CMA, am acting as scribe for Sarina Ser, MD . Documentation: I have reviewed the above documentation for accuracy and completeness, and I agree with the above.  Sarina Ser, MD

## 2022-02-25 NOTE — Patient Instructions (Signed)

## 2022-02-26 DIAGNOSIS — H353132 Nonexudative age-related macular degeneration, bilateral, intermediate dry stage: Secondary | ICD-10-CM | POA: Diagnosis not present

## 2022-03-01 ENCOUNTER — Encounter: Payer: Self-pay | Admitting: Dermatology

## 2022-03-03 DIAGNOSIS — M0609 Rheumatoid arthritis without rheumatoid factor, multiple sites: Secondary | ICD-10-CM | POA: Diagnosis not present

## 2022-03-03 DIAGNOSIS — M159 Polyosteoarthritis, unspecified: Secondary | ICD-10-CM | POA: Diagnosis not present

## 2022-03-03 DIAGNOSIS — Z79899 Other long term (current) drug therapy: Secondary | ICD-10-CM | POA: Diagnosis not present

## 2022-03-04 DIAGNOSIS — M25871 Other specified joint disorders, right ankle and foot: Secondary | ICD-10-CM | POA: Diagnosis not present

## 2022-03-04 DIAGNOSIS — L270 Generalized skin eruption due to drugs and medicaments taken internally: Secondary | ICD-10-CM | POA: Diagnosis not present

## 2022-03-04 DIAGNOSIS — M778 Other enthesopathies, not elsewhere classified: Secondary | ICD-10-CM | POA: Diagnosis not present

## 2022-03-04 DIAGNOSIS — Q828 Other specified congenital malformations of skin: Secondary | ICD-10-CM | POA: Diagnosis not present

## 2022-03-05 DIAGNOSIS — H029 Unspecified disorder of eyelid: Secondary | ICD-10-CM | POA: Diagnosis not present

## 2022-04-16 DIAGNOSIS — H2513 Age-related nuclear cataract, bilateral: Secondary | ICD-10-CM | POA: Diagnosis not present

## 2022-04-16 DIAGNOSIS — H401231 Low-tension glaucoma, bilateral, mild stage: Secondary | ICD-10-CM | POA: Diagnosis not present

## 2022-04-16 DIAGNOSIS — C44111 Basal cell carcinoma of skin of unspecified eyelid, including canthus: Secondary | ICD-10-CM | POA: Diagnosis not present

## 2022-04-16 DIAGNOSIS — H353132 Nonexudative age-related macular degeneration, bilateral, intermediate dry stage: Secondary | ICD-10-CM | POA: Diagnosis not present

## 2022-04-20 ENCOUNTER — Ambulatory Visit: Payer: Medicare HMO | Admitting: Dermatology

## 2022-04-20 DIAGNOSIS — D492 Neoplasm of unspecified behavior of bone, soft tissue, and skin: Secondary | ICD-10-CM

## 2022-04-20 DIAGNOSIS — L720 Epidermal cyst: Secondary | ICD-10-CM | POA: Diagnosis not present

## 2022-04-20 MED ORDER — MUPIROCIN 2 % EX OINT: 1.0000 | TOPICAL_OINTMENT | Freq: Every day | CUTANEOUS | 1 refills | Status: AC

## 2022-04-20 NOTE — Progress Notes (Signed)
   Follow-Up Visit   Subjective  Herbert Molina is a 81 y.o. male who presents for the following: Cyst (R post base of neck, pt presents for excision).  The following portions of the chart were reviewed this encounter and updated as appropriate:   Tobacco  Allergies  Meds  Problems  Med Hx  Surg Hx  Fam Hx     Review of Systems:  No other skin or systemic complaints except as noted in HPI or Assessment and Plan.  Objective  Well appearing patient in no apparent distress; mood and affect are within normal limits.  A focused examination was performed including neck. Relevant physical exam findings are noted in the Assessment and Plan.  R post base of neck Cystic pap 2.1 x 1.6cm   Assessment & Plan  Neoplasm of skin R post base of neck  Skin excision  Lesion length (cm):  2.1 Lesion width (cm):  1.6 Margin per side (cm):  0 Total excision diameter (cm):  2.1 Informed consent: discussed and consent obtained   Timeout: patient name, date of birth, surgical site, and procedure verified   Procedure prep:  Patient was prepped and draped in usual sterile fashion Prep type:  Isopropyl alcohol and povidone-iodine Anesthesia: the lesion was anesthetized in a standard fashion   Anesthetic:  1% lidocaine w/ epinephrine 1-100,000 buffered w/ 8.4% NaHCO3 (6cc lido w/ epi, 3cc bupivicaine, Total of 9cc) Instrument used comment:  #15c blade Hemostasis achieved with: pressure   Hemostasis achieved with comment:  Electrocautery Outcome: patient tolerated procedure well with no complications   Post-procedure details: sterile dressing applied and wound care instructions given   Dressing type: bandage, pressure dressing and bacitracin (Mupirocin)    Skin repair Complexity:  Complex Final length (cm):  3 Reason for type of repair: reduce tension to allow closure, reduce the risk of dehiscence, infection, and necrosis, reduce subcutaneous dead space and avoid a hematoma, allow closure of  the large defect, preserve normal anatomy, preserve normal anatomical and functional relationships and enhance both functionality and cosmetic results   Undermining: area extensively undermined   Undermining comment:  Undermining Defect 1.6cm Subcutaneous layers (deep stitches):  Suture type: Vicryl (polyglactin 910)   Subcutaneous suture technique: Inverted Dermal. Fine/surface layer approximation (top stitches):  Suture size:  4-0 Suture type: nylon   Stitches: simple running   Suture removal (days):  7 Hemostasis achieved with: pressure Outcome: patient tolerated procedure well with no complications   Post-procedure details: sterile dressing applied and wound care instructions given   Dressing type: bandage, pressure dressing and bacitracin (Mupirocin)    mupirocin ointment (BACTROBAN) 2 % Apply 1 Application topically daily. Qd to excision site  Specimen 1 - Surgical pathology Differential Diagnosis: D48.5 Cyst vs other  Check Margins: No Cystic pap 2.1 x 1.6cm  Cyst vs other, excised today Start Mupirocin oint qd to excision site   Return in about 1 week (around 04/27/2022) for suture removal.  I, Othelia Pulling, RMA, am acting as scribe for Sarina Ser, MD . Documentation: I have reviewed the above documentation for accuracy and completeness, and I agree with the above.  Sarina Ser, MD

## 2022-04-20 NOTE — Patient Instructions (Signed)

## 2022-04-21 ENCOUNTER — Telehealth: Payer: Self-pay

## 2022-04-21 NOTE — Telephone Encounter (Signed)
Pt doing fine after yesterdays surgery./sh 

## 2022-04-27 ENCOUNTER — Ambulatory Visit (INDEPENDENT_AMBULATORY_CARE_PROVIDER_SITE_OTHER): Payer: Medicare HMO | Admitting: Dermatology

## 2022-04-27 DIAGNOSIS — S80862A Insect bite (nonvenomous), left lower leg, initial encounter: Secondary | ICD-10-CM

## 2022-04-27 DIAGNOSIS — W57XXXA Bitten or stung by nonvenomous insect and other nonvenomous arthropods, initial encounter: Secondary | ICD-10-CM | POA: Diagnosis not present

## 2022-04-27 DIAGNOSIS — L719 Rosacea, unspecified: Secondary | ICD-10-CM | POA: Diagnosis not present

## 2022-04-27 DIAGNOSIS — L720 Epidermal cyst: Secondary | ICD-10-CM

## 2022-04-27 DIAGNOSIS — Z4802 Encounter for removal of sutures: Secondary | ICD-10-CM

## 2022-04-27 DIAGNOSIS — L821 Other seborrheic keratosis: Secondary | ICD-10-CM

## 2022-04-27 DIAGNOSIS — L820 Inflamed seborrheic keratosis: Secondary | ICD-10-CM | POA: Diagnosis not present

## 2022-04-27 DIAGNOSIS — L570 Actinic keratosis: Secondary | ICD-10-CM | POA: Diagnosis not present

## 2022-04-27 DIAGNOSIS — L738 Other specified follicular disorders: Secondary | ICD-10-CM

## 2022-04-27 DIAGNOSIS — L578 Other skin changes due to chronic exposure to nonionizing radiation: Secondary | ICD-10-CM

## 2022-04-27 MED ORDER — TRIAMCINOLONE ACETONIDE 0.1 % EX CREA: 1.0000 | TOPICAL_CREAM | CUTANEOUS | 0 refills | Status: AC

## 2022-04-27 NOTE — Patient Instructions (Addendum)
Cryotherapy Aftercare  Wash gently with soap and water everyday.   Apply Vaseline and Band-Aid daily until healed.     Due to recent changes in healthcare laws, you may see results of your pathology and/or laboratory studies on MyChart before the doctors have had a chance to review them. We understand that in some cases there may be results that are confusing or concerning to you. Please understand that not all results are received at the same time and often the doctors may need to interpret multiple results in order to provide you with the best plan of care or course of treatment. Therefore, we ask that you please give us 2 business days to thoroughly review all your results before contacting the office for clarification. Should we see a critical lab result, you will be contacted sooner.   If You Need Anything After Your Visit  If you have any questions or concerns for your doctor, please call our main line at [phone] and press option 4 to reach your doctor's medical assistant. If no one answers, please leave a voicemail as directed and we will return your call as soon as possible. Messages left after 4 pm will be answered the following business day.   You may also send us a message via MyChart. We typically respond to MyChart messages within 1-2 business days.  For prescription refills, please ask your pharmacy to contact our office. Our fax number is [phone].  If you have an urgent issue when the clinic is closed that cannot wait until the next business day, you can page your doctor at the number below.    Please note that while we do our best to be available for urgent issues outside of office hours, we are not available 24/7.   If you have an urgent issue and are unable to reach us, you may choose to seek medical care at your doctor's office, retail clinic, urgent care center, or emergency room.  If you have a medical emergency, please immediately call 911 or go to the  emergency department.  Pager Numbers  - Dr. Kowalski: [phone]  - Dr. Moye: [phone]  - Dr. Stewart: [phone]  In the event of inclement weather, please call our main line at [phone] for an update on the status of any delays or closures.  Dermatology Medication Tips: Please keep the boxes that topical medications come in in order to help keep track of the instructions about where and how to use these. Pharmacies typically print the medication instructions only on the boxes and not directly on the medication tubes.   If your medication is too expensive, please contact our office at [phone] option 4 or send us a message through MyChart.   We are unable to tell what your co-pay for medications will be in advance as this is different depending on your insurance coverage. However, we may be able to find a substitute medication at lower cost or fill out paperwork to get insurance to cover a needed medication.   If a prior authorization is required to get your medication covered by your insurance company, please allow us 1-2 business days to complete this process.  Drug prices often vary depending on where the prescription is filled and some pharmacies may offer cheaper prices.  The website www.goodrx.com contains coupons for medications through different pharmacies. The prices here do not account for what the cost may be with help from insurance (it may be cheaper with your insurance), but the website can   give you the price if you did not use any insurance.  - You can print the associated coupon and take it with your prescription to the pharmacy.  - You may also stop by our office during regular business hours and pick up a GoodRx coupon card.  - If you need your prescription sent electronically to a different pharmacy, notify our office through [place] MyChart or by phone at [phone] option 4.     Si Usted Necesita Algo Despus de Su Visita  Tambin puede  enviarnos un mensaje a travs de MyChart. Por lo general respondemos a los mensajes de MyChart en el transcurso de 1 a 2 das hbiles.  Para renovar recetas, por favor pida a su farmacia que se ponga en contacto con nuestra oficina. Nuestro nmero de fax es el [phone].  Si tiene un asunto urgente cuando la clnica est cerrada y que no puede esperar hasta el siguiente da hbil, puede llamar/localizar a su doctor(a) al nmero que aparece a continuacin.   Por favor, tenga en cuenta que aunque hacemos todo lo posible para estar disponibles para asuntos urgentes fuera del horario de oficina, no estamos disponibles las 24 horas del da, los 7 das de la semana.   Si tiene un problema urgente y no puede comunicarse con nosotros, puede optar por buscar atencin mdica  en el consultorio de su doctor(a), en una clnica privada, en un centro de atencin urgente o en una sala de emergencias.  Si tiene una emergencia mdica, por favor llame inmediatamente al 911 o vaya a la sala de emergencias.  Nmeros de bper  - Dr. Kowalski: [phone]  - Dra. Moye: [phone]  - Dra. Stewart: [phone]  En caso de inclemencias del tiempo, por favor llame a nuestra lnea principal al [phone] para una actualizacin sobre el estado de cualquier retraso o cierre.  Consejos para la medicacin en dermatologa: Por favor, guarde las cajas en las que vienen los medicamentos de uso tpico para ayudarle a seguir las instrucciones sobre dnde y cmo usarlos. Las farmacias generalmente imprimen las instrucciones del medicamento slo en las cajas y no directamente en los tubos del medicamento.   Si su medicamento es muy caro, por favor, pngase en contacto con nuestra oficina llamando al [phone] y presione la opcin 4 o envenos un mensaje a travs de MyChart.   No podemos decirle cul ser su copago por los medicamentos por adelantado ya que esto es diferente dependiendo de la cobertura de su seguro.  Sin embargo, es posible que podamos encontrar un medicamento sustituto a menor costo o llenar un formulario para que el seguro cubra el medicamento que se considera necesario.   Si se requiere una autorizacin previa para que su compaa de seguros cubra su medicamento, por favor permtanos de 1 a 2 das hbiles para completar este proceso.  Los precios de los medicamentos varan con frecuencia dependiendo del lugar de dnde se surte la receta y alguna farmacias pueden ofrecer precios ms baratos.  El sitio web www.goodrx.com tiene cupones para medicamentos de diferentes farmacias. Los precios aqu no tienen en cuenta lo que podra costar con la ayuda del seguro (puede ser ms barato con su seguro), pero el sitio web puede darle el precio si no utiliz ningn seguro.  - Puede imprimir el cupn correspondiente y llevarlo con su receta a la farmacia.  - Tambin puede pasar por nuestra oficina durante el horario de atencin regular y recoger una tarjeta de cupones de GoodRx.  -   Si necesita que su receta se enve electrnicamente a una farmacia diferente, informe a nuestra oficina a travs de MyChart de [place] o por telfono llamando al [phone] y presione la opcin 4.  

## 2022-04-27 NOTE — Progress Notes (Signed)
Follow-Up Visit   Subjective  Herbert Molina is a 81 y.o. male who presents for the following: Cyst bx proven (R post base of neck, pt presents for suture removal), check spot (R lower eyelid, ~109[facility] bites (L leg, ~1wk, itchy, using otc anti itch spray), and Rosacea (Face, Metronidazole 0.75% cr qhs). The patient has spots, moles and lesions to be evaluated, some may be new or changing and the patient has concerns that these could be cancer.  The following portions of the chart were reviewed this encounter and updated as appropriate:   Tobacco  Allergies  Meds  Problems  Med Hx  Surg Hx  Fam Hx     Review of Systems:  No other skin or systemic complaints except as noted in HPI or Assessment and Plan.  Objective  Well appearing patient in no apparent distress; mood and affect are within normal limits.  All skin waist up examined.  R post base of neck Healing excision site  Scalp x 1, R ear x 1 (2) Pink scaly macules  L forehead x 2, back x 1, L arms/hand x 7, R arm/hand x 8 (18) Stuck on waxy paps with erythema  R ant axilla, R inf cheek Cystic pap 0.6cm R ant axilla Cystic pap 3.028mR inf cheek  L leg Pink paps  R mid lower eyelid margin Flesh colored pap with small indentation 64m4mface Erythema mid face   Assessment & Plan   Actinic Damage - chronic, secondary to cumulative UV radiation exposure/sun exposure over time - diffuse scaly erythematous macules with underlying dyspigmentation - Recommend daily broad spectrum sunscreen SPF 30+ to sun-exposed areas, reapply every 2 hours as needed.  - Recommend staying in the shade or wearing long sleeves, sun glasses (UVA+UVB protection) and wide brim hats (4-inch brim around the entire circumference of the hat). - Call for new or changing lesions.   Seborrheic Keratoses - Stuck-on, waxy, tan-brown papules and/or plaques  - Benign-appearing - Discussed benign  etiology and prognosis. - Observe - Call for any changes  Epidermal cyst R post base of neck Bx proven, healing well.  Encounter for Removal of Sutures - Incision site at the right posterior base of neck is clean, dry and intact - Wound cleansed, sutures removed, wound cleansed and steri strips applied.  - Discussed pathology results showing epidermal cyst  - Patient advised to keep steri-strips dry until they fall off. - Scars remodel for a full year. - Once steri-strips fall off, patient can apply over-the-counter silicone scar cream each night to help with scar remodeling if desired. - Patient advised to call with any concerns or if they notice any new or changing lesions.   AK (actinic keratosis) (2) Scalp x 1, R ear x 1 Destruction of lesion - Scalp x 1, R ear x 1 Complexity: simple   Destruction method: cryotherapy   Informed consent: discussed and consent obtained   Timeout:  patient name, date of birth, surgical site, and procedure verified Lesion destroyed using liquid nitrogen: Yes   Region frozen until ice ball extended beyond lesion: Yes   Outcome: patient tolerated procedure well with no complications   Post-procedure details: wound care instructions given    Related Medications fluorouracil (EFUDEX) 5 % cream Apply to scalp twice a day for 7 days, then stop  Inflamed seborrheic keratosis (18) L forehead x 2, back x 1, L arms/hand x 7,  R arm/hand x 8 Symptomatic, irritating, patient would like treated. Destruction of lesion - L forehead x 2, back x 1, L arms/hand x 7, R arm/hand x 8 Complexity: simple   Destruction method: cryotherapy   Informed consent: discussed and consent obtained   Timeout:  patient name, date of birth, surgical site, and procedure verified Lesion destroyed using liquid nitrogen: Yes   Region frozen until ice ball extended beyond lesion: Yes   Outcome: patient tolerated procedure well with no complications   Post-procedure details: wound  care instructions given    Epidermal inclusion cyst R ant axilla, R inf cheek Benign-appearing. Exam most consistent with an epidermal inclusion cyst. Discussed that a cyst is a benign growth that can grow over time and sometimes get irritated or inflamed. Recommend observation if it is not bothersome. Discussed option of surgical excision to remove it if it is growing, symptomatic, or other changes noted. Please call for new or changing lesions so they can be evaluated.  Bug bite without infection, initial encounter L leg Start TMC 0.1% cr qd/bid until clear, avoid f/g/a  Topical steroids (such as triamcinolone, fluocinolone, fluocinonide, mometasone, clobetasol, halobetasol, betamethasone, hydrocortisone) can cause thinning and lightening of the skin if they are used for too long in the same area. Your physician has selected the right strength medicine for your problem and area affected on the body. Please use your medication only as directed by your physician to prevent side effects.    triamcinolone cream (KENALOG) 0.1 % - L leg Apply 1 Application topically as directed. Qd to bid to bites on legs until clear, avoid face, groin, axilla  Sebaceous hyperplasia versus other Patient has an appointment at [facility] for evaluation and possible biopsy of this lesion. R mid lower eyelid margin Keep appointment at [facility] for evaluation for this.  Rosacea face Rosacea is a chronic progressive skin condition usually affecting the face of adults, causing redness and/or acne bumps. It is treatable but not curable. It sometimes affects the eyes (ocular rosacea) as well. It may respond to topical and/or systemic medication and can flare with stress, sun exposure, alcohol, exercise and some foods.  Daily application of broad spectrum spf 30+ sunscreen to face is recommended to reduce flares.  Cont Metronidazole 0.75% gel qhs  Related Medications metroNIDAZOLE (METROGEL) 0.75 %  gel Apply to face at bedtime  Return in about 6 months (around 10/28/2022) for TBSE, Hx of AKs, Hx of BCC.  I, Othelia Pulling, RMA, am acting as scribe for Sarina Ser, MD . Documentation: I have reviewed the above documentation for accuracy and completeness, and I agree with the above.  Sarina Ser, MD

## 2022-04-30 ENCOUNTER — Encounter: Payer: Self-pay | Admitting: Dermatology

## 2022-05-02 ENCOUNTER — Encounter: Payer: Self-pay | Admitting: Dermatology

## 2022-05-07 DIAGNOSIS — Z98811 Dental restoration status: Secondary | ICD-10-CM | POA: Diagnosis not present

## 2022-05-07 DIAGNOSIS — K0889 Other specified disorders of teeth and supporting structures: Secondary | ICD-10-CM | POA: Diagnosis not present

## 2022-05-20 DIAGNOSIS — L600 Ingrowing nail: Secondary | ICD-10-CM | POA: Diagnosis not present

## 2022-05-20 DIAGNOSIS — B351 Tinea unguium: Secondary | ICD-10-CM | POA: Diagnosis not present

## 2022-05-20 DIAGNOSIS — M79675 Pain in left toe(s): Secondary | ICD-10-CM | POA: Diagnosis not present

## 2022-05-20 DIAGNOSIS — S90851A Superficial foreign body, right foot, initial encounter: Secondary | ICD-10-CM | POA: Diagnosis not present

## 2022-05-20 DIAGNOSIS — M778 Other enthesopathies, not elsewhere classified: Secondary | ICD-10-CM | POA: Diagnosis not present

## 2022-05-20 DIAGNOSIS — M79674 Pain in right toe(s): Secondary | ICD-10-CM | POA: Diagnosis not present

## 2022-05-25 DIAGNOSIS — M25511 Pain in right shoulder: Secondary | ICD-10-CM | POA: Diagnosis not present

## 2022-05-25 DIAGNOSIS — M25462 Effusion, left knee: Secondary | ICD-10-CM | POA: Diagnosis not present

## 2022-05-25 DIAGNOSIS — M75121 Complete rotator cuff tear or rupture of right shoulder, not specified as traumatic: Secondary | ICD-10-CM | POA: Diagnosis not present

## 2022-05-25 DIAGNOSIS — G8929 Other chronic pain: Secondary | ICD-10-CM | POA: Diagnosis not present

## 2022-05-25 DIAGNOSIS — M12811 Other specific arthropathies, not elsewhere classified, right shoulder: Secondary | ICD-10-CM | POA: Diagnosis not present

## 2022-05-25 DIAGNOSIS — M1712 Unilateral primary osteoarthritis, left knee: Secondary | ICD-10-CM | POA: Diagnosis not present

## 2022-05-27 DIAGNOSIS — C44111 Basal cell carcinoma of skin of unspecified eyelid, including canthus: Secondary | ICD-10-CM | POA: Diagnosis not present

## 2022-06-03 ENCOUNTER — Ambulatory Visit: Payer: Medicare HMO | Admitting: Dermatology

## 2022-06-03 DIAGNOSIS — L821 Other seborrheic keratosis: Secondary | ICD-10-CM | POA: Diagnosis not present

## 2022-06-03 DIAGNOSIS — L570 Actinic keratosis: Secondary | ICD-10-CM

## 2022-06-03 DIAGNOSIS — L820 Inflamed seborrheic keratosis: Secondary | ICD-10-CM

## 2022-06-03 DIAGNOSIS — L578 Other skin changes due to chronic exposure to nonionizing radiation: Secondary | ICD-10-CM

## 2022-06-03 DIAGNOSIS — D692 Other nonthrombocytopenic purpura: Secondary | ICD-10-CM

## 2022-06-03 NOTE — Patient Instructions (Signed)
Cryotherapy Aftercare  Wash gently with soap and water everyday.   Apply Vaseline and Band-Aid daily until healed.     Due to recent changes in healthcare laws, you may see results of your pathology and/or laboratory studies on MyChart before the doctors have had a chance to review them. We understand that in some cases there may be results that are confusing or concerning to you. Please understand that not all results are received at the same time and often the doctors may need to interpret multiple results in order to provide you with the best plan of care or course of treatment. Therefore, we ask that you please give us 2 business days to thoroughly review all your results before contacting the office for clarification. Should we see a critical lab result, you will be contacted sooner.   If You Need Anything After Your Visit  If you have any questions or concerns for your doctor, please call our main line at [phone] and press option 4 to reach your doctor's medical assistant. If no one answers, please leave a voicemail as directed and we will return your call as soon as possible. Messages left after 4 pm will be answered the following business day.   You may also send us a message via MyChart. We typically respond to MyChart messages within 1-2 business days.  For prescription refills, please ask your pharmacy to contact our office. Our fax number is [phone].  If you have an urgent issue when the clinic is closed that cannot wait until the next business day, you can page your doctor at the number below.    Please note that while we do our best to be available for urgent issues outside of office hours, we are not available 24/7.   If you have an urgent issue and are unable to reach us, you may choose to seek medical care at your doctor's office, retail clinic, urgent care center, or emergency room.  If you have a medical emergency, please immediately call 911 or go to the  emergency department.  Pager Numbers  - Dr. Kowalski: [phone]  - Dr. Moye: [phone]  - Dr. Stewart: [phone]  In the event of inclement weather, please call our main line at [phone] for an update on the status of any delays or closures.  Dermatology Medication Tips: Please keep the boxes that topical medications come in in order to help keep track of the instructions about where and how to use these. Pharmacies typically print the medication instructions only on the boxes and not directly on the medication tubes.   If your medication is too expensive, please contact our office at [phone] option 4 or send us a message through MyChart.   We are unable to tell what your co-pay for medications will be in advance as this is different depending on your insurance coverage. However, we may be able to find a substitute medication at lower cost or fill out paperwork to get insurance to cover a needed medication.   If a prior authorization is required to get your medication covered by your insurance company, please allow us 1-2 business days to complete this process.  Drug prices often vary depending on where the prescription is filled and some pharmacies may offer cheaper prices.  The website www.goodrx.com contains coupons for medications through different pharmacies. The prices here do not account for what the cost may be with help from insurance (it may be cheaper with your insurance), but the website can   give you the price if you did not use any insurance.  - You can print the associated coupon and take it with your prescription to the pharmacy.  - You may also stop by our office during regular business hours and pick up a GoodRx coupon card.  - If you need your prescription sent electronically to a different pharmacy, notify our office through [facility] MyChart or by phone at [phone] option 4.     Si Usted Necesita Algo Despus de Su Visita  Tambin puede  enviarnos un mensaje a travs de MyChart. Por lo general respondemos a los mensajes de MyChart en el transcurso de 1 a 2 das hbiles.  Para renovar recetas, por favor pida a su farmacia que se ponga en contacto con nuestra oficina. Nuestro nmero de fax es el [phone].  Si tiene un asunto urgente cuando la clnica est cerrada y que no puede esperar hasta el siguiente da hbil, puede llamar/localizar a su doctor(a) al nmero que aparece a continuacin.   Por favor, tenga en cuenta que aunque hacemos todo lo posible para estar disponibles para asuntos urgentes fuera del horario de oficina, no estamos disponibles las 24 horas del da, los 7 das de la semana.   Si tiene un problema urgente y no puede comunicarse con nosotros, puede optar por buscar atencin mdica  en el consultorio de su doctor(a), en una clnica privada, en un centro de atencin urgente o en una sala de emergencias.  Si tiene una emergencia mdica, por favor llame inmediatamente al 911 o vaya a la sala de emergencias.  Nmeros de bper  - Dr. Kowalski: [phone]  - Dra. Moye: [phone]  - Dra. Stewart: [phone]  En caso de inclemencias del tiempo, por favor llame a nuestra lnea principal al [phone] para una actualizacin sobre el estado de cualquier retraso o cierre.  Consejos para la medicacin en dermatologa: Por favor, guarde las cajas en las que vienen los medicamentos de uso tpico para ayudarle a seguir las instrucciones sobre dnde y cmo usarlos. Las farmacias generalmente imprimen las instrucciones del medicamento slo en las cajas y no directamente en los tubos del medicamento.   Si su medicamento es muy caro, por favor, pngase en contacto con nuestra oficina llamando al [phone] y presione la opcin 4 o envenos un mensaje a travs de MyChart.   No podemos decirle cul ser su copago por los medicamentos por adelantado ya que esto es diferente dependiendo de la cobertura de su seguro.  Sin embargo, es posible que podamos encontrar un medicamento sustituto a menor costo o llenar un formulario para que el seguro cubra el medicamento que se considera necesario.   Si se requiere una autorizacin previa para que su compaa de seguros cubra su medicamento, por favor permtanos de 1 a 2 das hbiles para completar este proceso.  Los precios de los medicamentos varan con frecuencia dependiendo del lugar de dnde se surte la receta y alguna farmacias pueden ofrecer precios ms baratos.  El sitio web www.goodrx.com tiene cupones para medicamentos de diferentes farmacias. Los precios aqu no tienen en cuenta lo que podra costar con la ayuda del seguro (puede ser ms barato con su seguro), pero el sitio web puede darle el precio si no utiliz ningn seguro.  - Puede imprimir el cupn correspondiente y llevarlo con su receta a la farmacia.  - Tambin puede pasar por nuestra oficina durante el horario de atencin regular y recoger una tarjeta de cupones de GoodRx.  -   Si necesita que su receta se enve electrnicamente a una farmacia diferente, informe a nuestra oficina a travs de MyChart de [place] o por telfono llamando al [phone] y presione la opcin 4.  

## 2022-06-03 NOTE — Progress Notes (Signed)
   Follow-Up Visit   Subjective  Herbert Molina is a 81 y.o. male who presents for the following: Actinic Keratosis (1 month follow up of scalp treated with LN2 and 5FU/Calcipotriene cream). The patient has spots, moles and lesions to be evaluated, some may be new or changing and the patient has concerns that these could be cancer.  Accompanied by wife  The following portions of the chart were reviewed this encounter and updated as appropriate:   Tobacco  Allergies  Meds  Problems  Med Hx  Surg Hx  Fam Hx     Review of Systems:  No other skin or systemic complaints except as noted in HPI or Assessment and Plan.  Objective  Well appearing patient in no apparent distress; mood and affect are within normal limits.  A focused examination was performed including scalp, face, arms, hands. Relevant physical exam findings are noted in the Assessment and Plan.  Scalp x 1, right ear x 1, left postauricular x 1 (3) Erythematous thin papules/macules with gritty scale.   Arms x 4, right postauricular x 1 (5) Erythematous stuck-on, waxy papule or plaque   Assessment & Plan   Actinic Damage - chronic, secondary to cumulative UV radiation exposure/sun exposure over time - diffuse scaly erythematous macules with underlying dyspigmentation - Recommend daily broad spectrum sunscreen SPF 30+ to sun-exposed areas, reapply every 2 hours as needed.  - Recommend staying in the shade or wearing long sleeves, sun glasses (UVA+UVB protection) and wide brim hats (4-inch brim around the entire circumference of the hat). - Call for new or changing lesions.  Purpura - Chronic; persistent and recurrent.  Treatable, but not curable. - Violaceous macules and patches - Benign - Related to trauma, age, sun damage and/or use of blood thinners, chronic use of topical and/or oral steroids - Observe - Can use OTC arnica containing moisturizer such as Dermend Bruise Formula if desired - Call for worsening or  other concerns  Seborrheic Keratoses - Stuck-on, waxy, tan-brown papules and/or plaques  - Benign-appearing - Discussed benign etiology and prognosis. - Observe - Call for any changes  AK (actinic keratosis) (3) Scalp x 1, right ear x 1, left postauricular x 1  Destruction of lesion - Scalp x 1, right ear x 1, left postauricular x 1 Complexity: simple   Destruction method: cryotherapy   Informed consent: discussed and consent obtained   Timeout:  patient name, date of birth, surgical site, and procedure verified Lesion destroyed using liquid nitrogen: Yes   Region frozen until ice ball extended beyond lesion: Yes   Outcome: patient tolerated procedure well with no complications   Post-procedure details: wound care instructions given    Inflamed seborrheic keratosis (5) Arms x 4, right postauricular x 1  Destruction of lesion - Arms x 4, right postauricular x 1 Complexity: simple   Destruction method: cryotherapy   Informed consent: discussed and consent obtained   Timeout:  patient name, date of birth, surgical site, and procedure verified Lesion destroyed using liquid nitrogen: Yes   Region frozen until ice ball extended beyond lesion: Yes   Outcome: patient tolerated procedure well with no complications   Post-procedure details: wound care instructions given     Return in about 6 months (around 12/02/2022) for Follow up.  I, Ashok Cordia, CMA, am acting as scribe for Sarina Ser, MD . Documentation: I have reviewed the above documentation for accuracy and completeness, and I agree with the above.  Sarina Ser, MD

## 2022-06-11 ENCOUNTER — Encounter: Payer: Self-pay | Admitting: Dermatology

## 2022-06-24 DIAGNOSIS — H029 Unspecified disorder of eyelid: Secondary | ICD-10-CM | POA: Diagnosis not present

## 2022-06-24 DIAGNOSIS — D485 Neoplasm of uncertain behavior of skin: Secondary | ICD-10-CM | POA: Diagnosis not present

## 2022-07-07 DIAGNOSIS — S51811A Laceration without foreign body of right forearm, initial encounter: Secondary | ICD-10-CM | POA: Diagnosis not present

## 2022-07-07 DIAGNOSIS — L03113 Cellulitis of right upper limb: Secondary | ICD-10-CM | POA: Diagnosis not present

## 2022-07-08 DIAGNOSIS — Z79899 Other long term (current) drug therapy: Secondary | ICD-10-CM | POA: Diagnosis not present

## 2022-07-08 DIAGNOSIS — I10 Essential (primary) hypertension: Secondary | ICD-10-CM | POA: Diagnosis not present

## 2022-07-08 DIAGNOSIS — E782 Mixed hyperlipidemia: Secondary | ICD-10-CM | POA: Diagnosis not present

## 2022-07-08 DIAGNOSIS — R7309 Other abnormal glucose: Secondary | ICD-10-CM | POA: Diagnosis not present

## 2022-07-12 DIAGNOSIS — M0609 Rheumatoid arthritis without rheumatoid factor, multiple sites: Secondary | ICD-10-CM | POA: Diagnosis not present

## 2022-07-12 DIAGNOSIS — R413 Other amnesia: Secondary | ICD-10-CM | POA: Diagnosis not present

## 2022-07-12 DIAGNOSIS — N138 Other obstructive and reflux uropathy: Secondary | ICD-10-CM | POA: Diagnosis not present

## 2022-07-12 DIAGNOSIS — E782 Mixed hyperlipidemia: Secondary | ICD-10-CM | POA: Diagnosis not present

## 2022-07-12 DIAGNOSIS — I10 Essential (primary) hypertension: Secondary | ICD-10-CM | POA: Diagnosis not present

## 2022-07-12 DIAGNOSIS — N401 Enlarged prostate with lower urinary tract symptoms: Secondary | ICD-10-CM | POA: Diagnosis not present

## 2022-07-12 DIAGNOSIS — Z79899 Other long term (current) drug therapy: Secondary | ICD-10-CM | POA: Diagnosis not present

## 2022-07-12 DIAGNOSIS — Z125 Encounter for screening for malignant neoplasm of prostate: Secondary | ICD-10-CM | POA: Diagnosis not present

## 2022-07-14 DIAGNOSIS — Z79899 Other long term (current) drug therapy: Secondary | ICD-10-CM | POA: Diagnosis not present

## 2022-07-14 DIAGNOSIS — M0609 Rheumatoid arthritis without rheumatoid factor, multiple sites: Secondary | ICD-10-CM | POA: Diagnosis not present

## 2022-07-14 DIAGNOSIS — M159 Polyosteoarthritis, unspecified: Secondary | ICD-10-CM | POA: Diagnosis not present

## 2022-07-14 DIAGNOSIS — S51811D Laceration without foreign body of right forearm, subsequent encounter: Secondary | ICD-10-CM | POA: Diagnosis not present

## 2022-08-08 DIAGNOSIS — J019 Acute sinusitis, unspecified: Secondary | ICD-10-CM | POA: Diagnosis not present

## 2022-08-08 DIAGNOSIS — U071 COVID-19: Secondary | ICD-10-CM | POA: Diagnosis not present

## 2022-08-08 DIAGNOSIS — B9689 Other specified bacterial agents as the cause of diseases classified elsewhere: Secondary | ICD-10-CM | POA: Diagnosis not present

## 2022-08-08 DIAGNOSIS — Z03818 Encounter for observation for suspected exposure to other biological agents ruled out: Secondary | ICD-10-CM | POA: Diagnosis not present

## 2022-08-08 DIAGNOSIS — J209 Acute bronchitis, unspecified: Secondary | ICD-10-CM | POA: Diagnosis not present

## 2022-08-15 DIAGNOSIS — Z8616 Personal history of COVID-19: Secondary | ICD-10-CM | POA: Diagnosis not present

## 2022-08-15 DIAGNOSIS — Z03818 Encounter for observation for suspected exposure to other biological agents ruled out: Secondary | ICD-10-CM | POA: Diagnosis not present

## 2022-08-15 DIAGNOSIS — R051 Acute cough: Secondary | ICD-10-CM | POA: Diagnosis not present

## 2022-08-16 DIAGNOSIS — R059 Cough, unspecified: Secondary | ICD-10-CM | POA: Diagnosis not present

## 2022-09-14 DIAGNOSIS — M778 Other enthesopathies, not elsewhere classified: Secondary | ICD-10-CM | POA: Diagnosis not present

## 2022-09-14 DIAGNOSIS — Q828 Other specified congenital malformations of skin: Secondary | ICD-10-CM | POA: Diagnosis not present

## 2022-09-14 DIAGNOSIS — M79674 Pain in right toe(s): Secondary | ICD-10-CM | POA: Diagnosis not present

## 2022-09-14 DIAGNOSIS — L600 Ingrowing nail: Secondary | ICD-10-CM | POA: Diagnosis not present

## 2022-09-14 DIAGNOSIS — B351 Tinea unguium: Secondary | ICD-10-CM | POA: Diagnosis not present

## 2022-09-14 DIAGNOSIS — M79675 Pain in left toe(s): Secondary | ICD-10-CM | POA: Diagnosis not present

## 2022-09-14 DIAGNOSIS — M25871 Other specified joint disorders, right ankle and foot: Secondary | ICD-10-CM | POA: Diagnosis not present

## 2022-09-22 ENCOUNTER — Ambulatory Visit: Payer: Medicare HMO | Admitting: Dermatology

## 2022-09-22 DIAGNOSIS — L820 Inflamed seborrheic keratosis: Secondary | ICD-10-CM | POA: Diagnosis not present

## 2022-09-22 DIAGNOSIS — L578 Other skin changes due to chronic exposure to nonionizing radiation: Secondary | ICD-10-CM | POA: Diagnosis not present

## 2022-09-22 DIAGNOSIS — L570 Actinic keratosis: Secondary | ICD-10-CM

## 2022-09-22 NOTE — Patient Instructions (Signed)
Cryotherapy Aftercare  Wash gently with soap and water everyday.   Apply Vaseline and Band-Aid daily until healed.     Due to recent changes in healthcare laws, you may see results of your pathology and/or laboratory studies on MyChart before the doctors have had a chance to review them. We understand that in some cases there may be results that are confusing or concerning to you. Please understand that not all results are received at the same time and often the doctors may need to interpret multiple results in order to provide you with the best plan of care or course of treatment. Therefore, we ask that you please give us 2 business days to thoroughly review all your results before contacting the office for clarification. Should we see a critical lab result, you will be contacted sooner.   If You Need Anything After Your Visit  If you have any questions or concerns for your doctor, please call our main line at [phone] and press option 4 to reach your doctor's medical assistant. If no one answers, please leave a voicemail as directed and we will return your call as soon as possible. Messages left after 4 pm will be answered the following business day.   You may also send us a message via MyChart. We typically respond to MyChart messages within 1-2 business days.  For prescription refills, please ask your pharmacy to contact our office. Our fax number is [phone].  If you have an urgent issue when the clinic is closed that cannot wait until the next business day, you can page your doctor at the number below.    Please note that while we do our best to be available for urgent issues outside of office hours, we are not available 24/7.   If you have an urgent issue and are unable to reach us, you may choose to seek medical care at your doctor's office, retail clinic, urgent care center, or emergency room.  If you have a medical emergency, please immediately call 911 or go to the  emergency department.  Pager Numbers  - Dr. Kowalski: [phone]  - Dr. Moye: [phone]  - Dr. Stewart: [phone]  In the event of inclement weather, please call our main line at [phone] for an update on the status of any delays or closures.  Dermatology Medication Tips: Please keep the boxes that topical medications come in in order to help keep track of the instructions about where and how to use these. Pharmacies typically print the medication instructions only on the boxes and not directly on the medication tubes.   If your medication is too expensive, please contact our office at [phone] option 4 or send us a message through MyChart.   We are unable to tell what your co-pay for medications will be in advance as this is different depending on your insurance coverage. However, we may be able to find a substitute medication at lower cost or fill out paperwork to get insurance to cover a needed medication.   If a prior authorization is required to get your medication covered by your insurance company, please allow us 1-2 business days to complete this process.  Drug prices often vary depending on where the prescription is filled and some pharmacies may offer cheaper prices.  The website www.goodrx.com contains coupons for medications through different pharmacies. The prices here do not account for what the cost may be with help from insurance (it may be cheaper with your insurance), but the website can   give you the price if you did not use any insurance.  - You can print the associated coupon and take it with your prescription to the pharmacy.  - You may also stop by our office during regular business hours and pick up a GoodRx coupon card.  - If you need your prescription sent electronically to a different pharmacy, notify our office through [facility] MyChart or by phone at [phone] option 4.     Si Usted Necesita Algo Despus de Su Visita  Tambin puede  enviarnos un mensaje a travs de MyChart. Por lo general respondemos a los mensajes de MyChart en el transcurso de 1 a 2 das hbiles.  Para renovar recetas, por favor pida a su farmacia que se ponga en contacto con nuestra oficina. Nuestro nmero de fax es el [phone].  Si tiene un asunto urgente cuando la clnica est cerrada y que no puede esperar hasta el siguiente da hbil, puede llamar/localizar a su doctor(a) al nmero que aparece a continuacin.   Por favor, tenga en cuenta que aunque hacemos todo lo posible para estar disponibles para asuntos urgentes fuera del horario de oficina, no estamos disponibles las 24 horas del da, los 7 das de la semana.   Si tiene un problema urgente y no puede comunicarse con nosotros, puede optar por buscar atencin mdica  en el consultorio de su doctor(a), en una clnica privada, en un centro de atencin urgente o en una sala de emergencias.  Si tiene una emergencia mdica, por favor llame inmediatamente al 911 o vaya a la sala de emergencias.  Nmeros de bper  - Dr. Kowalski: [phone]  - Dra. Moye: [phone]  - Dra. Stewart: [phone]  En caso de inclemencias del tiempo, por favor llame a nuestra lnea principal al [phone] para una actualizacin sobre el estado de cualquier retraso o cierre.  Consejos para la medicacin en dermatologa: Por favor, guarde las cajas en las que vienen los medicamentos de uso tpico para ayudarle a seguir las instrucciones sobre dnde y cmo usarlos. Las farmacias generalmente imprimen las instrucciones del medicamento slo en las cajas y no directamente en los tubos del medicamento.   Si su medicamento es muy caro, por favor, pngase en contacto con nuestra oficina llamando al [phone] y presione la opcin 4 o envenos un mensaje a travs de MyChart.   No podemos decirle cul ser su copago por los medicamentos por adelantado ya que esto es diferente dependiendo de la cobertura de su seguro.  Sin embargo, es posible que podamos encontrar un medicamento sustituto a menor costo o llenar un formulario para que el seguro cubra el medicamento que se considera necesario.   Si se requiere una autorizacin previa para que su compaa de seguros cubra su medicamento, por favor permtanos de 1 a 2 das hbiles para completar este proceso.  Los precios de los medicamentos varan con frecuencia dependiendo del lugar de dnde se surte la receta y alguna farmacias pueden ofrecer precios ms baratos.  El sitio web www.goodrx.com tiene cupones para medicamentos de diferentes farmacias. Los precios aqu no tienen en cuenta lo que podra costar con la ayuda del seguro (puede ser ms barato con su seguro), pero el sitio web puede darle el precio si no utiliz ningn seguro.  - Puede imprimir el cupn correspondiente y llevarlo con su receta a la farmacia.  - Tambin puede pasar por nuestra oficina durante el horario de atencin regular y recoger una tarjeta de cupones de GoodRx.  -   Si necesita que su receta se enve electrnicamente a una farmacia diferente, informe a nuestra oficina a travs de MyChart de [place] o por telfono llamando al [phone] y presione la opcin 4.  

## 2022-09-22 NOTE — Progress Notes (Signed)
   Follow-Up Visit   Subjective  Herbert Molina is a 81 y.o. male who presents for the following: Other (Spots of scalp that need to be treated). The patient has spots, moles and lesions to be evaluated, some may be new or changing and the patient has concerns that these could be cancer.  The following portions of the chart were reviewed this encounter and updated as appropriate:   Tobacco  Allergies  Meds  Problems  Med Hx  Surg Hx  Fam Hx     Review of Systems:  No other skin or systemic complaints except as noted in HPI or Assessment and Plan.  Objective  Well appearing patient in no apparent distress; mood and affect are within normal limits.  A focused examination was performed including scalp, face, arms. Relevant physical exam findings are noted in the Assessment and Plan.  Scalp x 2, neck x 2 (4) Erythematous stuck-on, waxy papule or plaque  Scalp x 4, right ear x 1, nose x 1 arms x 4 (10) Erythematous thin papules/macules with gritty scale.    Assessment & Plan   Actinic Damage - chronic, secondary to cumulative UV radiation exposure/sun exposure over time - diffuse scaly erythematous macules with underlying dyspigmentation - Recommend daily broad spectrum sunscreen SPF 30+ to sun-exposed areas, reapply every 2 hours as needed.  - Recommend staying in the shade or wearing long sleeves, sun glasses (UVA+UVB protection) and wide brim hats (4-inch brim around the entire circumference of the hat). - Call for new or changing lesions.  Inflamed seborrheic keratosis (4) Scalp x 2, neck x 2  Destruction of lesion - Scalp x 2, neck x 2 Complexity: simple   Destruction method: cryotherapy   Informed consent: discussed and consent obtained   Timeout:  patient name, date of birth, surgical site, and procedure verified Lesion destroyed using liquid nitrogen: Yes   Region frozen until ice ball extended beyond lesion: Yes   Outcome: patient tolerated procedure well with no  complications   Post-procedure details: wound care instructions given    AK (actinic keratosis) (10) Scalp x 4, right ear x 1, nose x 1 arms x 4  Destruction of lesion - Scalp x 4, right ear x 1, nose x 1 arms x 4 Complexity: simple   Destruction method: cryotherapy   Informed consent: discussed and consent obtained   Timeout:  patient name, date of birth, surgical site, and procedure verified Lesion destroyed using liquid nitrogen: Yes   Region frozen until ice ball extended beyond lesion: Yes   Outcome: patient tolerated procedure well with no complications   Post-procedure details: wound care instructions given     Return for Follow up as scheduled. Documentation: I have reviewed the above documentation for accuracy and completeness, and I agree with the above.  Sarina Ser, MD

## 2022-10-01 ENCOUNTER — Encounter: Payer: Self-pay | Admitting: Dermatology

## 2022-10-07 DIAGNOSIS — M47816 Spondylosis without myelopathy or radiculopathy, lumbar region: Secondary | ICD-10-CM | POA: Diagnosis not present

## 2022-10-07 DIAGNOSIS — M79605 Pain in left leg: Secondary | ICD-10-CM | POA: Diagnosis not present

## 2022-10-07 DIAGNOSIS — M1712 Unilateral primary osteoarthritis, left knee: Secondary | ICD-10-CM | POA: Diagnosis not present

## 2022-10-07 DIAGNOSIS — M545 Low back pain, unspecified: Secondary | ICD-10-CM | POA: Diagnosis not present

## 2022-10-19 DIAGNOSIS — H353132 Nonexudative age-related macular degeneration, bilateral, intermediate dry stage: Secondary | ICD-10-CM | POA: Diagnosis not present

## 2022-10-19 DIAGNOSIS — H40003 Preglaucoma, unspecified, bilateral: Secondary | ICD-10-CM | POA: Diagnosis not present

## 2022-10-19 DIAGNOSIS — H2513 Age-related nuclear cataract, bilateral: Secondary | ICD-10-CM | POA: Diagnosis not present

## 2022-11-03 ENCOUNTER — Ambulatory Visit: Payer: Medicare HMO | Admitting: Dermatology

## 2022-12-09 ENCOUNTER — Ambulatory Visit: Payer: Medicare HMO | Admitting: Dermatology

## 2022-12-22 DIAGNOSIS — M79675 Pain in left toe(s): Secondary | ICD-10-CM | POA: Diagnosis not present

## 2022-12-22 DIAGNOSIS — M79674 Pain in right toe(s): Secondary | ICD-10-CM | POA: Diagnosis not present

## 2022-12-22 DIAGNOSIS — B351 Tinea unguium: Secondary | ICD-10-CM | POA: Diagnosis not present

## 2023-01-17 DIAGNOSIS — M1812 Unilateral primary osteoarthritis of first carpometacarpal joint, left hand: Secondary | ICD-10-CM | POA: Diagnosis not present

## 2023-01-17 DIAGNOSIS — M65312 Trigger thumb, left thumb: Secondary | ICD-10-CM | POA: Diagnosis not present

## 2023-03-02 DIAGNOSIS — I10 Essential (primary) hypertension: Secondary | ICD-10-CM | POA: Diagnosis not present

## 2023-03-02 DIAGNOSIS — Z79899 Other long term (current) drug therapy: Secondary | ICD-10-CM | POA: Diagnosis not present

## 2023-03-02 DIAGNOSIS — E782 Mixed hyperlipidemia: Secondary | ICD-10-CM | POA: Diagnosis not present

## 2023-03-02 DIAGNOSIS — Z125 Encounter for screening for malignant neoplasm of prostate: Secondary | ICD-10-CM | POA: Diagnosis not present

## 2023-03-02 DIAGNOSIS — R7309 Other abnormal glucose: Secondary | ICD-10-CM | POA: Diagnosis not present

## 2023-03-09 DIAGNOSIS — I10 Essential (primary) hypertension: Secondary | ICD-10-CM | POA: Diagnosis not present

## 2023-03-09 DIAGNOSIS — N400 Enlarged prostate without lower urinary tract symptoms: Secondary | ICD-10-CM | POA: Diagnosis not present

## 2023-03-09 DIAGNOSIS — E785 Hyperlipidemia, unspecified: Secondary | ICD-10-CM | POA: Diagnosis not present

## 2023-03-09 DIAGNOSIS — Z0000 Encounter for general adult medical examination without abnormal findings: Secondary | ICD-10-CM | POA: Diagnosis not present

## 2023-03-09 DIAGNOSIS — M069 Rheumatoid arthritis, unspecified: Secondary | ICD-10-CM | POA: Diagnosis not present

## 2023-03-19 DIAGNOSIS — K120 Recurrent oral aphthae: Secondary | ICD-10-CM | POA: Diagnosis not present

## 2023-03-30 ENCOUNTER — Ambulatory Visit: Payer: Medicare HMO | Admitting: Dermatology

## 2023-04-14 DIAGNOSIS — H2513 Age-related nuclear cataract, bilateral: Secondary | ICD-10-CM | POA: Diagnosis not present

## 2023-04-14 DIAGNOSIS — H353132 Nonexudative age-related macular degeneration, bilateral, intermediate dry stage: Secondary | ICD-10-CM | POA: Diagnosis not present

## 2023-04-14 DIAGNOSIS — H40003 Preglaucoma, unspecified, bilateral: Secondary | ICD-10-CM | POA: Diagnosis not present

## 2023-04-29 DIAGNOSIS — M79606 Pain in leg, unspecified: Secondary | ICD-10-CM | POA: Diagnosis not present

## 2023-04-29 DIAGNOSIS — M0609 Rheumatoid arthritis without rheumatoid factor, multiple sites: Secondary | ICD-10-CM | POA: Diagnosis not present

## 2023-04-29 DIAGNOSIS — M7989 Other specified soft tissue disorders: Secondary | ICD-10-CM | POA: Diagnosis not present

## 2023-04-29 DIAGNOSIS — Z79899 Other long term (current) drug therapy: Secondary | ICD-10-CM | POA: Diagnosis not present

## 2023-05-02 DIAGNOSIS — R234 Changes in skin texture: Secondary | ICD-10-CM | POA: Diagnosis not present

## 2023-05-02 DIAGNOSIS — B351 Tinea unguium: Secondary | ICD-10-CM | POA: Diagnosis not present

## 2023-05-02 DIAGNOSIS — L600 Ingrowing nail: Secondary | ICD-10-CM | POA: Diagnosis not present

## 2023-05-02 DIAGNOSIS — M79675 Pain in left toe(s): Secondary | ICD-10-CM | POA: Diagnosis not present

## 2023-05-02 DIAGNOSIS — M778 Other enthesopathies, not elsewhere classified: Secondary | ICD-10-CM | POA: Diagnosis not present

## 2023-05-02 DIAGNOSIS — M79674 Pain in right toe(s): Secondary | ICD-10-CM | POA: Diagnosis not present

## 2023-05-02 DIAGNOSIS — M2041 Other hammer toe(s) (acquired), right foot: Secondary | ICD-10-CM | POA: Diagnosis not present

## 2023-05-03 ENCOUNTER — Ambulatory Visit: Payer: Medicare HMO | Admitting: Dermatology

## 2023-05-03 ENCOUNTER — Other Ambulatory Visit
Admission: RE | Admit: 2023-05-03 | Discharge: 2023-05-03 | Disposition: A | Discharge status: Home / Self Care | Payer: Medicare HMO | Source: Ambulatory Visit | Attending: Student | Admitting: Student

## 2023-05-03 ENCOUNTER — Encounter: Payer: Self-pay | Admitting: Dermatology

## 2023-05-03 VITALS — BP 122/80 | HR 72

## 2023-05-03 DIAGNOSIS — R0602 Shortness of breath: Secondary | ICD-10-CM | POA: Insufficient documentation

## 2023-05-03 DIAGNOSIS — Z03818 Encounter for observation for suspected exposure to other biological agents ruled out: Secondary | ICD-10-CM | POA: Diagnosis not present

## 2023-05-03 DIAGNOSIS — Z872 Personal history of diseases of the skin and subcutaneous tissue: Secondary | ICD-10-CM | POA: Diagnosis not present

## 2023-05-03 DIAGNOSIS — L820 Inflamed seborrheic keratosis: Secondary | ICD-10-CM | POA: Diagnosis not present

## 2023-05-03 DIAGNOSIS — D692 Other nonthrombocytopenic purpura: Secondary | ICD-10-CM | POA: Diagnosis not present

## 2023-05-03 DIAGNOSIS — J029 Acute pharyngitis, unspecified: Secondary | ICD-10-CM | POA: Diagnosis not present

## 2023-05-03 DIAGNOSIS — L989 Disorder of the skin and subcutaneous tissue, unspecified: Secondary | ICD-10-CM | POA: Diagnosis not present

## 2023-05-03 DIAGNOSIS — L570 Actinic keratosis: Secondary | ICD-10-CM | POA: Diagnosis not present

## 2023-05-03 DIAGNOSIS — R051 Acute cough: Secondary | ICD-10-CM | POA: Diagnosis not present

## 2023-05-03 DIAGNOSIS — L578 Other skin changes due to chronic exposure to nonionizing radiation: Secondary | ICD-10-CM | POA: Diagnosis not present

## 2023-05-03 DIAGNOSIS — J019 Acute sinusitis, unspecified: Secondary | ICD-10-CM | POA: Diagnosis not present

## 2023-05-03 DIAGNOSIS — R600 Localized edema: Secondary | ICD-10-CM | POA: Insufficient documentation

## 2023-05-03 DIAGNOSIS — W908XXA Exposure to other nonionizing radiation, initial encounter: Secondary | ICD-10-CM

## 2023-05-03 DIAGNOSIS — R918 Other nonspecific abnormal finding of lung field: Secondary | ICD-10-CM | POA: Diagnosis not present

## 2023-05-03 DIAGNOSIS — R5383 Other fatigue: Secondary | ICD-10-CM | POA: Diagnosis not present

## 2023-05-03 LAB — BRAIN NATRIURETIC PEPTIDE: B Natriuretic Peptide: 26.6 pg/mL (ref 0.0–100.0)

## 2023-05-03 NOTE — Patient Instructions (Addendum)
Cryotherapy Aftercare  Wash gently with soap and water everyday.   Apply Vaseline Jelly daily until healed.    Due to recent changes in healthcare laws, you may see results of your pathology and/or laboratory studies on MyChart before the doctors have had a chance to review them. We understand that in some cases there may be results that are confusing or concerning to you. Please understand that not all results are received at the same time and often the doctors may need to interpret multiple results in order to provide you with the best plan of care or course of treatment. Therefore, we ask that you please give [place] 2 business days to thoroughly review all your results before contacting the office for clarification. Should we see a critical lab result, you will be contacted sooner.   If You Need Anything After Your Visit  If you have any questions or concerns for your doctor, please call our main line at [phone] and press option 4 to reach your doctor's medical assistant. If no one answers, please leave a voicemail as directed and we will return your call as soon as possible. Messages left after 4 pm will be answered the following business day.   You may also send [place] a message via MyChart. We typically respond to MyChart messages within 1-2 business days.  For prescription refills, please ask your pharmacy to contact our office. Our fax number is [phone].  If you have an urgent issue when the clinic is closed that cannot wait until the next business day, you can page your doctor at the number below.    Please note that while we do our best to be available for urgent issues outside of office hours, we are not available 24/7.   If you have an urgent issue and are unable to reach [place], you may choose to seek medical care at your doctor's office, retail clinic, urgent care center, or emergency room.  If you have a medical emergency, please immediately call 911 or go to the emergency  department.  Pager Numbers  - Dr. Gwen Pounds: [phone]  - Dr. Neale Burly: [phone]  - Dr. Roseanne Reno: [phone]  In the event of inclement weather, please call our main line at [phone] for an update on the status of any delays or closures.  Dermatology Medication Tips: Please keep the boxes that topical medications come in in order to help keep track of the instructions about where and how to use these. Pharmacies typically print the medication instructions only on the boxes and not directly on the medication tubes.   If your medication is too expensive, please contact our office at [phone] option 4 or send [place] a message through MyChart.   We are unable to tell what your co-pay for medications will be in advance as this is different depending on your insurance coverage. However, we may be able to find a substitute medication at lower cost or fill out paperwork to get insurance to cover a needed medication.   If a prior authorization is required to get your medication covered by your insurance company, please allow [place] 1-2 business days to complete this process.  Drug prices often vary depending on where the prescription is filled and some pharmacies may offer cheaper prices.  The website www.goodrx.com contains coupons for medications through different pharmacies. The prices here do not account for what the cost may be with help from insurance (it may be cheaper with your insurance), but the website can give you  give you the price if you did not use any insurance.  - You can print the associated coupon and take it with your prescription to the pharmacy.  - You may also stop by our office during regular business hours and pick up a GoodRx coupon card.  - If you need your prescription sent electronically to a different pharmacy, notify our office through [facility] MyChart or by phone at [phone] option 4.     Si Usted Necesita Algo Despus de Su Visita  Tambin puede  enviarnos un mensaje a travs de MyChart. Por lo general respondemos a los mensajes de MyChart en el transcurso de 1 a 2 das hbiles.  Para renovar recetas, por favor pida a su farmacia que se ponga en contacto con nuestra oficina. Nuestro nmero de fax es el [phone].  Si tiene un asunto urgente cuando la clnica est cerrada y que no puede esperar hasta el siguiente da hbil, puede llamar/localizar a su doctor(a) al nmero que aparece a continuacin.   Por favor, tenga en cuenta que aunque hacemos todo lo posible para estar disponibles para asuntos urgentes fuera del horario de oficina, no estamos disponibles las 24 horas del da, los 7 das de la semana.   Si tiene un problema urgente y no puede comunicarse con nosotros, puede optar por buscar atencin mdica  en el consultorio de su doctor(a), en una clnica privada, en un centro de atencin urgente o en una sala de emergencias.  Si tiene una emergencia mdica, por favor llame inmediatamente al 911 o vaya a la sala de emergencias.  Nmeros de bper  - Dr. Kowalski: [phone]  - Dra. Moye: [phone]  - Dra. Stewart: [phone]  En caso de inclemencias del tiempo, por favor llame a nuestra lnea principal al [phone] para una actualizacin sobre el estado de cualquier retraso o cierre.  Consejos para la medicacin en dermatologa: Por favor, guarde las cajas en las que vienen los medicamentos de uso tpico para ayudarle a seguir las instrucciones sobre dnde y cmo usarlos. Las farmacias generalmente imprimen las instrucciones del medicamento slo en las cajas y no directamente en los tubos del medicamento.   Si su medicamento es muy caro, por favor, pngase en contacto con nuestra oficina llamando al [phone] y presione la opcin 4 o envenos un mensaje a travs de MyChart.   No podemos decirle cul ser su copago por los medicamentos por adelantado ya que esto es diferente dependiendo de la cobertura de su seguro.  Sin embargo, es posible que podamos encontrar un medicamento sustituto a menor costo o llenar un formulario para que el seguro cubra el medicamento que se considera necesario.   Si se requiere una autorizacin previa para que su compaa de seguros cubra su medicamento, por favor permtanos de 1 a 2 das hbiles para completar este proceso.  Los precios de los medicamentos varan con frecuencia dependiendo del lugar de dnde se surte la receta y alguna farmacias pueden ofrecer precios ms baratos.  El sitio web www.goodrx.com tiene cupones para medicamentos de diferentes farmacias. Los precios aqu no tienen en cuenta lo que podra costar con la ayuda del seguro (puede ser ms barato con su seguro), pero el sitio web puede darle el precio si no utiliz ningn seguro.  - Puede imprimir el cupn correspondiente y llevarlo con su receta a la farmacia.  - Tambin puede pasar por nuestra oficina durante el horario de atencin regular y recoger una tarjeta de cupones de GoodRx.  -   Si necesita que su receta se enve electrnicamente a una farmacia diferente, informe a nuestra oficina a travs de MyChart de  o por telfono llamando al [phone] y presione la opcin 4.  

## 2023-05-03 NOTE — Progress Notes (Signed)
Follow-Up Visit   Subjective  Herbert Molina is a 82 y.o. male who presents for the following: Spots on arms. Raised, rough. Gets hit at times. Hx of Aks on scalp.  The patient has spots, moles and lesions to be evaluated, some may be new or changing and the patient may have concern these could be cancer.  The following portions of the chart were reviewed this encounter and updated as appropriate: medications, allergies, medical history  Review of Systems:  No other skin or systemic complaints except as noted in HPI or Assessment and Plan.  Objective  Well appearing patient in no apparent distress; mood and affect are within normal limits. A focused examination was performed of the following areas: Arms, scalp Relevant physical exam findings are noted in the Assessment and Plan.  B/L forearms x6 (6) Erythematous keratotic or waxy stuck-on papule or plaque.  Scalp x7 (7) Erythematous thin papules/macules with gritty scale.    Assessment & Plan   Inflamed seborrheic keratosis (6) B/L forearms x6  Symptomatic, irritating, patient would like treated.  Destruction of lesion - B/L forearms x6 (6) Complexity: simple   Destruction method: cryotherapy   Informed consent: discussed and consent obtained   Timeout:  patient name, date of birth, surgical site, and procedure verified Lesion destroyed using liquid nitrogen: Yes   Region frozen until ice ball extended beyond lesion: Yes   Outcome: patient tolerated procedure well with no complications   Post-procedure details: wound care instructions given   Additional details:  Prior to procedure, discussed risks of blister formation, small wound, skin dyspigmentation, or rare scar following cryotherapy. Recommend Vaseline ointment to treated areas while healing.   AK (actinic keratosis) (7) Scalp x7  Actinic keratoses are precancerous spots that appear secondary to cumulative UV radiation exposure/sun exposure over time. They are  chronic with expected duration over 1 year. A portion of actinic keratoses will progress to squamous cell carcinoma of the skin. It is not possible to reliably predict which spots will progress to skin cancer and so treatment is recommended to prevent development of skin cancer.  Recommend daily broad spectrum sunscreen SPF 30+ to sun-exposed areas, reapply every 2 hours as needed.  Recommend staying in the shade or wearing long sleeves, sun glasses (UVA+UVB protection) and wide brim hats (4-inch brim around the entire circumference of the hat). Call for new or changing lesions.  ACTINIC DAMAGE - chronic, secondary to cumulative UV radiation exposure/sun exposure over time - diffuse scaly erythematous macules with underlying dyspigmentation - Recommend daily broad spectrum sunscreen SPF 30+ to sun-exposed areas, reapply every 2 hours as needed.  - Recommend staying in the shade or wearing long sleeves, sun glasses (UVA+UVB protection) and wide brim hats (4-inch brim around the entire circumference of the hat). - Call for new or changing lesions.  Destruction of lesion - Scalp x7 (7) Complexity: simple   Destruction method: cryotherapy   Informed consent: discussed and consent obtained   Timeout:  patient name, date of birth, surgical site, and procedure verified Lesion destroyed using liquid nitrogen: Yes   Region frozen until ice ball extended beyond lesion: Yes   Outcome: patient tolerated procedure well with no complications   Post-procedure details: wound care instructions given   Additional details:  Prior to procedure, discussed risks of blister formation, small wound, skin dyspigmentation, or rare scar following cryotherapy. Recommend Vaseline ointment to treated areas while healing.    Purpura - Chronic; persistent and recurrent.  Treatable, but not  curable. - Violaceous macules and patches at arms - Benign - Related to trauma, age, sun damage and/or use of blood thinners, chronic  use of topical and/or oral steroids - Observe - Can use OTC arnica containing moisturizer such as Dermend Bruise Formula if desired - Call for worsening or other concerns  Return for Follow Up As Scheduled.  I, Lawson Radar, CMA, am acting as scribe for Armida Sans, MD.  Documentation: I have reviewed the above documentation for accuracy and completeness, and I agree with the above.  Armida Sans, MD

## 2023-05-04 DIAGNOSIS — E119 Type 2 diabetes mellitus without complications: Secondary | ICD-10-CM | POA: Diagnosis not present

## 2023-05-04 DIAGNOSIS — R600 Localized edema: Secondary | ICD-10-CM | POA: Diagnosis not present

## 2023-05-04 DIAGNOSIS — E782 Mixed hyperlipidemia: Secondary | ICD-10-CM | POA: Diagnosis not present

## 2023-05-04 DIAGNOSIS — R0602 Shortness of breath: Secondary | ICD-10-CM | POA: Diagnosis not present

## 2023-05-04 DIAGNOSIS — I10 Essential (primary) hypertension: Secondary | ICD-10-CM | POA: Diagnosis not present

## 2023-05-31 DIAGNOSIS — R0602 Shortness of breath: Secondary | ICD-10-CM | POA: Diagnosis not present

## 2023-06-14 DIAGNOSIS — Z79899 Other long term (current) drug therapy: Secondary | ICD-10-CM | POA: Diagnosis not present

## 2023-06-14 DIAGNOSIS — M069 Rheumatoid arthritis, unspecified: Secondary | ICD-10-CM | POA: Diagnosis not present

## 2023-06-14 DIAGNOSIS — I10 Essential (primary) hypertension: Secondary | ICD-10-CM | POA: Diagnosis not present

## 2023-06-14 DIAGNOSIS — R413 Other amnesia: Secondary | ICD-10-CM | POA: Diagnosis not present

## 2023-06-14 DIAGNOSIS — Z0000 Encounter for general adult medical examination without abnormal findings: Secondary | ICD-10-CM | POA: Diagnosis not present

## 2023-06-14 DIAGNOSIS — N400 Enlarged prostate without lower urinary tract symptoms: Secondary | ICD-10-CM | POA: Diagnosis not present

## 2023-06-14 DIAGNOSIS — E785 Hyperlipidemia, unspecified: Secondary | ICD-10-CM | POA: Diagnosis not present

## 2023-06-20 DIAGNOSIS — I10 Essential (primary) hypertension: Secondary | ICD-10-CM | POA: Diagnosis not present

## 2023-06-20 DIAGNOSIS — R600 Localized edema: Secondary | ICD-10-CM | POA: Diagnosis not present

## 2023-06-20 DIAGNOSIS — E782 Mixed hyperlipidemia: Secondary | ICD-10-CM | POA: Diagnosis not present

## 2023-06-22 ENCOUNTER — Encounter: Payer: Self-pay | Admitting: Dermatology

## 2023-06-22 ENCOUNTER — Ambulatory Visit: Payer: Medicare HMO | Admitting: Dermatology

## 2023-06-22 VITALS — BP 94/61

## 2023-06-22 DIAGNOSIS — C4492 Squamous cell carcinoma of skin, unspecified: Secondary | ICD-10-CM

## 2023-06-22 DIAGNOSIS — C44629 Squamous cell carcinoma of skin of left upper limb, including shoulder: Secondary | ICD-10-CM | POA: Diagnosis not present

## 2023-06-22 DIAGNOSIS — L720 Epidermal cyst: Secondary | ICD-10-CM

## 2023-06-22 DIAGNOSIS — L814 Other melanin hyperpigmentation: Secondary | ICD-10-CM | POA: Diagnosis not present

## 2023-06-22 DIAGNOSIS — D1801 Hemangioma of skin and subcutaneous tissue: Secondary | ICD-10-CM | POA: Diagnosis not present

## 2023-06-22 DIAGNOSIS — W908XXA Exposure to other nonionizing radiation, initial encounter: Secondary | ICD-10-CM | POA: Diagnosis not present

## 2023-06-22 DIAGNOSIS — L820 Inflamed seborrheic keratosis: Secondary | ICD-10-CM | POA: Diagnosis not present

## 2023-06-22 DIAGNOSIS — Z7189 Other specified counseling: Secondary | ICD-10-CM

## 2023-06-22 DIAGNOSIS — L570 Actinic keratosis: Secondary | ICD-10-CM

## 2023-06-22 DIAGNOSIS — Z85828 Personal history of other malignant neoplasm of skin: Secondary | ICD-10-CM

## 2023-06-22 DIAGNOSIS — L739 Follicular disorder, unspecified: Secondary | ICD-10-CM

## 2023-06-22 DIAGNOSIS — L729 Follicular cyst of the skin and subcutaneous tissue, unspecified: Secondary | ICD-10-CM

## 2023-06-22 DIAGNOSIS — Z79899 Other long term (current) drug therapy: Secondary | ICD-10-CM

## 2023-06-22 DIAGNOSIS — L578 Other skin changes due to chronic exposure to nonionizing radiation: Secondary | ICD-10-CM | POA: Diagnosis not present

## 2023-06-22 DIAGNOSIS — D229 Melanocytic nevi, unspecified: Secondary | ICD-10-CM

## 2023-06-22 DIAGNOSIS — Z1283 Encounter for screening for malignant neoplasm of skin: Secondary | ICD-10-CM | POA: Diagnosis not present

## 2023-06-22 DIAGNOSIS — D492 Neoplasm of unspecified behavior of bone, soft tissue, and skin: Secondary | ICD-10-CM

## 2023-06-22 DIAGNOSIS — Z872 Personal history of diseases of the skin and subcutaneous tissue: Secondary | ICD-10-CM

## 2023-06-22 HISTORY — DX: Squamous cell carcinoma of skin, unspecified: C44.92

## 2023-06-22 NOTE — Patient Instructions (Addendum)
Cryotherapy Aftercare  Wash gently with soap and water everyday.   Apply Vaseline and Band-Aid daily until healed.   Wound Care Instructions  Cleanse wound gently with soap and water once a day then pat dry with clean gauze. Apply a thin coat of Petrolatum (petroleum jelly, "Vaseline") over the wound (unless you have an allergy to this). We recommend that you use a new, sterile tube of Vaseline. Do not pick or remove scabs. Do not remove the yellow or white "healing tissue" from the base of the wound.  Cover the wound with fresh, clean, nonstick gauze and secure with paper tape. You may use Band-Aids in place of gauze and tape if the wound is small enough, but would recommend trimming much of the tape off as there is often too much. Sometimes Band-Aids can irritate the skin.  You should call the office for your biopsy report after 1 week if you have not already been contacted.  If you experience any problems, such as abnormal amounts of bleeding, swelling, significant bruising, significant pain, or evidence of infection, please call the office immediately.  FOR ADULT SURGERY PATIENTS: If you need something for pain relief you may take 1 extra strength Tylenol (acetaminophen) AND 2 Ibuprofen (200mg  each) together every 4 hours as needed for pain. (do not take these if you are allergic to them or if you have a reason you should not take them.) Typically, you may only need pain medication for 1 to 3 days.   INSIDE NOSTRILS Start Mupirocin ointment once daily as needed  for flares   Due to recent changes in healthcare laws, you may see results of your pathology and/or laboratory studies on MyChart before the doctors have had a chance to review them. We understand that in some cases there may be results that are confusing or concerning to you. Please understand that not all results are received at the same time and often the doctors may need to interpret multiple results in order to provide you with  the best plan of care or course of treatment. Therefore, we ask that you please give [place] 2 business days to thoroughly review all your results before contacting the office for clarification. Should we see a critical lab result, you will be contacted sooner.   If You Need Anything After Your Visit  If you have any questions or concerns for your doctor, please call our main line at [phone] and press option 4 to reach your doctor's medical assistant. If no one answers, please leave a voicemail as directed and we will return your call as soon as possible. Messages left after 4 pm will be answered the following business day.   You may also send [place] a message via MyChart. We typically respond to MyChart messages within 1-2 business days.  For prescription refills, please ask your pharmacy to contact our office. Our fax number is [phone].  If you have an urgent issue when the clinic is closed that cannot wait until the next business day, you can page your doctor at the number below.    Please note that while we do our best to be available for urgent issues outside of office hours, we are not available 24/7.   If you have an urgent issue and are unable to reach [place], you may choose to seek medical care at your doctor's office, retail clinic, urgent care center, or emergency room.  If you have a medical emergency, please immediately call 911 or go to the  emergency department.  Pager Numbers  - Dr. Gwen Pounds: [phone]  - Dr. Roseanne Reno: [phone]  - Dr. Katrinka Blazing: [phone]   In the event of inclement weather, please call our main line at [phone] for an update on the status of any delays or closures.  Dermatology Medication Tips: Please keep the boxes that topical medications come in in order to help keep track of the instructions about where and how to use these. Pharmacies typically print the medication instructions only on the boxes and not directly on the medication tubes.   If  your medication is too expensive, please contact our office at [phone] option 4 or send [place] a message through MyChart.   We are unable to tell what your co-pay for medications will be in advance as this is different depending on your insurance coverage. However, we may be able to find a substitute medication at lower cost or fill out paperwork to get insurance to cover a needed medication.   If a prior authorization is required to get your medication covered by your insurance company, please allow [place] 1-2 business days to complete this process.  Drug prices often vary depending on where the prescription is filled and some pharmacies may offer cheaper prices.  The website www.goodrx.com contains coupons for medications through different pharmacies. The prices here do not account for what the cost may be with help from insurance (it may be cheaper with your insurance), but the website can give you the price if you did not use any insurance.  - You can print the associated coupon and take it with your prescription to the pharmacy.  - You may also stop by our office during regular business hours and pick up a GoodRx coupon card.  - If you need your prescription sent electronically to a different pharmacy, notify our office through [facility] or by phone at [phone] option 4.     Si Usted Necesita Algo Despus de Su Visita  Tambin puede enviarnos un mensaje a travs de Clinical cytogeneticist. Por lo general respondemos a los mensajes de MyChart en el transcurso de 1 a 2 das hbiles.  Para renovar recetas, por favor pida a su farmacia que se ponga en contacto con nuestra oficina. Annie Sable de fax es [place] [phone].  Si tiene un asunto urgente cuando la clnica est cerrada y que no puede esperar hasta el siguiente da hbil, puede llamar/localizar a su doctor(a) al nmero que aparece a continuacin.   Por favor, tenga en cuenta que aunque hacemos todo lo posible para estar disponibles para  asuntos urgentes fuera del horario de [place], no estamos disponibles las 24 horas del da, los 7 [address]  [address] de la [place].   Si tiene un problema urgente y no puede comunicarse con nosotros, puede optar por buscar atencin mdica  en el consultorio de su doctor(a), en una clnica privada, en un centro de atencin urgente o en una sala de emergencias.  Si tiene Engineer, drilling, por favor llame inmediatamente al 911 o vaya a la sala de emergencias.  Nmeros de bper  - Dr. Gwen Pounds: [phone]  - Dra. Roseanne Reno: [phone]  - Dr. Katrinka Blazing: [phone]   En caso de inclemencias del tiempo, por favor llame a Lacy Duverney principal al [phone] para una actualizacin sobre el [place] de cualquier retraso o cierre.  Consejos para la medicacin en dermatologa: Por favor, guarde las cajas en las que vienen los medicamentos de uso tpico para ayudarle a seguir las instrucciones sobre dnde y cmo  usarlos. Las farmacias generalmente imprimen las instrucciones del medicamento slo en las cajas y no directamente en los tubos del [place].   Si su medicamento es muy caro, por favor, pngase en contacto con Rolm [place] llamando al [phone] y presione la opcin 4 o envenos un mensaje a travs de Clinical cytogeneticist.   No podemos decirle cul ser su copago por los medicamentos por adelantado ya que esto es diferente dependiendo de la cobertura de su seguro. Sin embargo, es posible que podamos encontrar un medicamento sustituto a Audiological scientist un formulario para que el seguro cubra el medicamento que se considera necesario.   Si se requiere una autorizacin previa para que su compaa de seguros [place] su medicamento, por favor permtanos de 1 a 2 das hbiles para completar [address]  Los precios de los medicamentos varan con frecuencia dependiendo del Environmental consultant de dnde se surte la receta y alguna farmacias pueden ofrecer precios ms baratos.  El sitio web www.goodrx.com tiene cupones para  medicamentos de Health and safety inspector. Los precios aqu no tienen en cuenta lo que podra costar con la ayuda del seguro (puede ser ms barato con su seguro), pero el sitio web puede darle el precio si no utiliz Tourist information centre manager.  - Puede imprimir el cupn correspondiente y llevarlo con su receta a la farmacia.  - Tambin puede pasar por nuestra oficina durante el horario de atencin regular y Education officer, museum una tarjeta de cupones de GoodRx.  - Si necesita que su receta se enve electrnicamente a una farmacia diferente, informe a nuestra oficina a travs de MyChart de [place] o por telfono llamando al [phone] y presione la opcin 4.

## 2023-06-22 NOTE — Progress Notes (Unsigned)
Follow-Up Visit   Subjective  Herbert Molina is a 82 y.o. male who presents for the following: Skin Cancer Screening and Upper Body Skin Exam, hx of Aks, BCC  The patient presents for Upper Body Skin Exam (UBSE) for skin cancer screening and mole check. The patient has spots, moles and lesions to be evaluated, some may be new or changing and the patient may have concern these could be cancer.    The following portions of the chart were reviewed this encounter and updated as appropriate: medications, allergies, medical history  Review of Systems:  No other skin or systemic complaints except as noted in HPI or Assessment and Plan.  Objective  Well appearing patient in no apparent distress; mood and affect are within normal limits.  All skin waist up examined. Relevant physical exam findings are noted in the Assessment and Plan.  Scalp x 6 (6) Pink scaly macules  L forearm 0.8cm hyperkeratotic pap       R neck x 1, R arm x 3 (4) Stuck on waxy paps with erythema    Assessment & Plan   AK (actinic keratosis) (6) Scalp x 6  Actinic keratoses are precancerous spots that appear secondary to cumulative UV radiation exposure/sun exposure over time. They are chronic with expected duration over 1 year. A portion of actinic keratoses will progress to squamous cell carcinoma of the skin. It is not possible to reliably predict which spots will progress to skin cancer and so treatment is recommended to prevent development of skin cancer.  Recommend daily broad spectrum sunscreen SPF 30+ to sun-exposed areas, reapply every 2 hours as needed.  Recommend staying in the shade or wearing long sleeves, sun glasses (UVA+UVB protection) and wide brim hats (4-inch brim around the entire circumference of the hat). Call for new or changing lesions.  Destruction of lesion - Scalp x 6 (6) Complexity: simple   Destruction method: cryotherapy   Informed consent: discussed and consent obtained    Timeout:  patient name, date of birth, surgical site, and procedure verified Lesion destroyed using liquid nitrogen: Yes   Region frozen until ice ball extended beyond lesion: Yes   Outcome: patient tolerated procedure well with no complications   Post-procedure details: wound care instructions given    Neoplasm of skin L forearm  Epidermal / dermal shaving  Lesion diameter (cm):  0.8 Informed consent: discussed and consent obtained   Timeout: patient name, date of birth, surgical site, and procedure verified   Procedure prep:  Patient was prepped and draped in usual sterile fashion Prep type:  Isopropyl alcohol Anesthesia: the lesion was anesthetized in a standard fashion   Anesthetic:  1% lidocaine w/ epinephrine 1-100,000 buffered w/ 8.4% NaHCO3 Instrument used: flexible razor blade   Hemostasis achieved with: pressure, aluminum chloride and electrodesiccation   Outcome: patient tolerated procedure well   Post-procedure details: sterile dressing applied and wound care instructions given   Dressing type: bandage and petrolatum    Destruction of lesion Complexity: extensive   Destruction method: electrodesiccation and curettage   Informed consent: discussed and consent obtained   Timeout:  patient name, date of birth, surgical site, and procedure verified Procedure prep:  Patient was prepped and draped in usual sterile fashion Prep type:  Isopropyl alcohol Anesthesia: the lesion was anesthetized in a standard fashion   Anesthetic:  1% lidocaine w/ epinephrine 1-100,000 buffered w/ 8.4% NaHCO3 Curettage performed in three different directions: Yes   Electrodesiccation performed over the curetted area:  Yes   Final wound size (cm):  0.8 Hemostasis achieved with:  pressure, aluminum chloride and electrodesiccation Outcome: patient tolerated procedure well with no complications   Post-procedure details: sterile dressing applied and wound care instructions given   Dressing type:  bandage and petrolatum    Specimen 1 - Surgical pathology Differential Diagnosis: D48.5 R/O SCC  Check Margins: yes 0.8cm hyperkeratotic pap EDC  Related Medications mupirocin ointment (BACTROBAN) 2 % Apply 1 Application topically daily. Qd to excision site  Inflamed seborrheic keratosis (4) R neck x 1, R arm x 3  Symptomatic, irritating, patient would like treated.  Destruction of lesion - R neck x 1, R arm x 3 (4) Complexity: simple   Destruction method: cryotherapy   Informed consent: discussed and consent obtained   Timeout:  patient name, date of birth, surgical site, and procedure verified Lesion destroyed using liquid nitrogen: Yes   Region frozen until ice ball extended beyond lesion: Yes   Outcome: patient tolerated procedure well with no complications   Post-procedure details: wound care instructions given    Skin cancer screening  Actinic skin damage  Lentigo  Melanocytic nevus, unspecified location  Medication management  Counseling and coordination of care  Folliculitis  Cyst of skin   Skin cancer screening performed today.  Actinic Damage - Chronic condition, secondary to cumulative UV/sun exposure - diffuse scaly erythematous macules with underlying dyspigmentation - Recommend daily broad spectrum sunscreen SPF 30+ to sun-exposed areas, reapply every 2 hours as needed.  - Staying in the shade or wearing long sleeves, sun glasses (UVA+UVB protection) and wide brim hats (4-inch brim around the entire circumference of the hat) are also recommended for sun protection.  - Call for new or changing lesions.  Lentigines, Seborrheic Keratoses, Hemangiomas - Benign normal skin lesions - Benign-appearing - Call for any changes  Melanocytic Nevi - Tan-brown and/or pink-flesh-colored symmetric macules and papules - Benign appearing on exam today - Observation - Call clinic for new or changing moles - Recommend daily use of broad spectrum spf 30+  sunscreen to sun-exposed areas.   HISTORY OF BASAL CELL CARCINOMA OF THE SKIN - No evidence of recurrence today - Recommend regular full body skin exams - Recommend daily broad spectrum sunscreen SPF 30+ to sun-exposed areas, reapply every 2 hours as needed.  - Call if any new or changing lesions are noted between office visits  - R medial forehead  EPIDERMAL INCLUSION CYST Exam: Subcutaneous nodule at R axilla 1.0cm  Benign-appearing. Exam most consistent with an epidermal inclusion cyst. Discussed that a cyst is a benign growth that can grow over time and sometimes get irritated or inflamed. Recommend observation if it is not bothersome. Discussed option of surgical excision to remove it if it is growing, symptomatic, or other changes noted. Please call for new or changing lesions so they can be evaluated.  FOLLICULITIS Inside nostrils Exam: clear today  Treatment Plan: Start Mupirocin oint qd prn flares    Return in about 6 months (around 12/20/2023) for AK f/u.  I, Ardis Rowan, RMA, am acting as scribe for Armida Sans, MD .   Documentation: I have reviewed the above documentation for accuracy and completeness, and I agree with the above.  Armida Sans, MD

## 2023-06-24 ENCOUNTER — Encounter: Payer: Self-pay | Admitting: Dermatology

## 2023-06-28 LAB — SURGICAL PATHOLOGY

## 2023-06-29 ENCOUNTER — Telehealth: Payer: Self-pay

## 2023-06-29 NOTE — Telephone Encounter (Signed)
-----   Message from Armida Sans sent at 06/28/2023  6:07 PM EDT -----  1. Skin, L forearm :       SQUAMOUS CELL CARCINOMA, KERATOACANTHOMA TYPE, DEEP MARGIN INVOLVED   Cancer = SCC Already treated Recheck next visit

## 2023-06-29 NOTE — Telephone Encounter (Signed)
Advised pt of bx results/sh ?

## 2023-07-12 DIAGNOSIS — Z96651 Presence of right artificial knee joint: Secondary | ICD-10-CM | POA: Diagnosis not present

## 2023-08-03 DIAGNOSIS — M25871 Other specified joint disorders, right ankle and foot: Secondary | ICD-10-CM | POA: Diagnosis not present

## 2023-08-03 DIAGNOSIS — M79674 Pain in right toe(s): Secondary | ICD-10-CM | POA: Diagnosis not present

## 2023-08-03 DIAGNOSIS — M79675 Pain in left toe(s): Secondary | ICD-10-CM | POA: Diagnosis not present

## 2023-08-03 DIAGNOSIS — L600 Ingrowing nail: Secondary | ICD-10-CM | POA: Diagnosis not present

## 2023-08-03 DIAGNOSIS — M778 Other enthesopathies, not elsewhere classified: Secondary | ICD-10-CM | POA: Diagnosis not present

## 2023-08-03 DIAGNOSIS — B351 Tinea unguium: Secondary | ICD-10-CM | POA: Diagnosis not present

## 2023-08-03 DIAGNOSIS — Q828 Other specified congenital malformations of skin: Secondary | ICD-10-CM | POA: Diagnosis not present

## 2023-09-06 DIAGNOSIS — D649 Anemia, unspecified: Secondary | ICD-10-CM | POA: Diagnosis not present

## 2023-09-06 DIAGNOSIS — N400 Enlarged prostate without lower urinary tract symptoms: Secondary | ICD-10-CM | POA: Diagnosis not present

## 2023-09-06 DIAGNOSIS — I10 Essential (primary) hypertension: Secondary | ICD-10-CM | POA: Diagnosis not present

## 2023-09-06 DIAGNOSIS — R7309 Other abnormal glucose: Secondary | ICD-10-CM | POA: Diagnosis not present

## 2023-09-06 DIAGNOSIS — Z125 Encounter for screening for malignant neoplasm of prostate: Secondary | ICD-10-CM | POA: Diagnosis not present

## 2023-09-06 DIAGNOSIS — E782 Mixed hyperlipidemia: Secondary | ICD-10-CM | POA: Diagnosis not present

## 2023-09-06 DIAGNOSIS — M0609 Rheumatoid arthritis without rheumatoid factor, multiple sites: Secondary | ICD-10-CM | POA: Diagnosis not present

## 2023-09-06 DIAGNOSIS — Z79899 Other long term (current) drug therapy: Secondary | ICD-10-CM | POA: Diagnosis not present

## 2023-09-07 DIAGNOSIS — M150 Primary generalized (osteo)arthritis: Secondary | ICD-10-CM | POA: Diagnosis not present

## 2023-09-07 DIAGNOSIS — M25562 Pain in left knee: Secondary | ICD-10-CM | POA: Diagnosis not present

## 2023-09-07 DIAGNOSIS — M1712 Unilateral primary osteoarthritis, left knee: Secondary | ICD-10-CM | POA: Diagnosis not present

## 2023-09-07 DIAGNOSIS — Z79899 Other long term (current) drug therapy: Secondary | ICD-10-CM | POA: Diagnosis not present

## 2023-09-07 DIAGNOSIS — G8929 Other chronic pain: Secondary | ICD-10-CM | POA: Diagnosis not present

## 2023-09-07 DIAGNOSIS — M0609 Rheumatoid arthritis without rheumatoid factor, multiple sites: Secondary | ICD-10-CM | POA: Diagnosis not present

## 2023-09-15 DIAGNOSIS — B353 Tinea pedis: Secondary | ICD-10-CM | POA: Diagnosis not present

## 2023-09-15 DIAGNOSIS — J019 Acute sinusitis, unspecified: Secondary | ICD-10-CM | POA: Diagnosis not present

## 2023-09-15 DIAGNOSIS — J209 Acute bronchitis, unspecified: Secondary | ICD-10-CM | POA: Diagnosis not present

## 2023-09-15 DIAGNOSIS — Z03818 Encounter for observation for suspected exposure to other biological agents ruled out: Secondary | ICD-10-CM | POA: Diagnosis not present

## 2023-09-15 DIAGNOSIS — B9689 Other specified bacterial agents as the cause of diseases classified elsewhere: Secondary | ICD-10-CM | POA: Diagnosis not present

## 2023-10-13 DIAGNOSIS — I10 Essential (primary) hypertension: Secondary | ICD-10-CM | POA: Diagnosis not present

## 2023-10-13 DIAGNOSIS — Z03818 Encounter for observation for suspected exposure to other biological agents ruled out: Secondary | ICD-10-CM | POA: Diagnosis not present

## 2023-10-13 DIAGNOSIS — J40 Bronchitis, not specified as acute or chronic: Secondary | ICD-10-CM | POA: Diagnosis not present

## 2023-11-04 DIAGNOSIS — M79674 Pain in right toe(s): Secondary | ICD-10-CM | POA: Diagnosis not present

## 2023-11-04 DIAGNOSIS — B351 Tinea unguium: Secondary | ICD-10-CM | POA: Diagnosis not present

## 2023-11-04 DIAGNOSIS — M79675 Pain in left toe(s): Secondary | ICD-10-CM | POA: Diagnosis not present

## 2023-11-15 DIAGNOSIS — H353132 Nonexudative age-related macular degeneration, bilateral, intermediate dry stage: Secondary | ICD-10-CM | POA: Diagnosis not present

## 2023-11-15 DIAGNOSIS — H40003 Preglaucoma, unspecified, bilateral: Secondary | ICD-10-CM | POA: Diagnosis not present

## 2023-11-15 DIAGNOSIS — H2513 Age-related nuclear cataract, bilateral: Secondary | ICD-10-CM | POA: Diagnosis not present

## 2023-12-01 DIAGNOSIS — L570 Actinic keratosis: Secondary | ICD-10-CM | POA: Diagnosis not present

## 2023-12-02 DIAGNOSIS — I10 Essential (primary) hypertension: Secondary | ICD-10-CM | POA: Diagnosis not present

## 2023-12-02 DIAGNOSIS — M79643 Pain in unspecified hand: Secondary | ICD-10-CM | POA: Diagnosis not present

## 2023-12-02 DIAGNOSIS — Z125 Encounter for screening for malignant neoplasm of prostate: Secondary | ICD-10-CM | POA: Diagnosis not present

## 2023-12-02 DIAGNOSIS — Z79899 Other long term (current) drug therapy: Secondary | ICD-10-CM | POA: Diagnosis not present

## 2023-12-02 DIAGNOSIS — E119 Type 2 diabetes mellitus without complications: Secondary | ICD-10-CM | POA: Diagnosis not present

## 2023-12-02 DIAGNOSIS — E782 Mixed hyperlipidemia: Secondary | ICD-10-CM | POA: Diagnosis not present

## 2023-12-02 DIAGNOSIS — R413 Other amnesia: Secondary | ICD-10-CM | POA: Diagnosis not present

## 2023-12-08 DIAGNOSIS — M25512 Pain in left shoulder: Secondary | ICD-10-CM | POA: Diagnosis not present

## 2023-12-21 ENCOUNTER — Ambulatory Visit: Payer: Medicare HMO | Admitting: Dermatology

## 2024-01-02 ENCOUNTER — Ambulatory Visit: Admitting: Dermatology

## 2024-01-17 ENCOUNTER — Encounter: Payer: Self-pay | Admitting: Dermatology

## 2024-01-17 ENCOUNTER — Ambulatory Visit (INDEPENDENT_AMBULATORY_CARE_PROVIDER_SITE_OTHER): Admitting: Dermatology

## 2024-01-17 DIAGNOSIS — W908XXA Exposure to other nonionizing radiation, initial encounter: Secondary | ICD-10-CM

## 2024-01-17 DIAGNOSIS — L304 Erythema intertrigo: Secondary | ICD-10-CM

## 2024-01-17 DIAGNOSIS — L578 Other skin changes due to chronic exposure to nonionizing radiation: Secondary | ICD-10-CM | POA: Diagnosis not present

## 2024-01-17 DIAGNOSIS — D229 Melanocytic nevi, unspecified: Secondary | ICD-10-CM

## 2024-01-17 DIAGNOSIS — L720 Epidermal cyst: Secondary | ICD-10-CM

## 2024-01-17 DIAGNOSIS — L570 Actinic keratosis: Secondary | ICD-10-CM | POA: Diagnosis not present

## 2024-01-17 DIAGNOSIS — Z79899 Other long term (current) drug therapy: Secondary | ICD-10-CM

## 2024-01-17 DIAGNOSIS — L820 Inflamed seborrheic keratosis: Secondary | ICD-10-CM

## 2024-01-17 DIAGNOSIS — D1801 Hemangioma of skin and subcutaneous tissue: Secondary | ICD-10-CM | POA: Diagnosis not present

## 2024-01-17 DIAGNOSIS — L719 Rosacea, unspecified: Secondary | ICD-10-CM

## 2024-01-17 DIAGNOSIS — Z1283 Encounter for screening for malignant neoplasm of skin: Secondary | ICD-10-CM

## 2024-01-17 DIAGNOSIS — L814 Other melanin hyperpigmentation: Secondary | ICD-10-CM | POA: Diagnosis not present

## 2024-01-17 DIAGNOSIS — L738 Other specified follicular disorders: Secondary | ICD-10-CM

## 2024-01-17 DIAGNOSIS — L821 Other seborrheic keratosis: Secondary | ICD-10-CM

## 2024-01-17 DIAGNOSIS — Z8589 Personal history of malignant neoplasm of other organs and systems: Secondary | ICD-10-CM

## 2024-01-17 DIAGNOSIS — Z85828 Personal history of other malignant neoplasm of skin: Secondary | ICD-10-CM

## 2024-01-17 DIAGNOSIS — Z7189 Other specified counseling: Secondary | ICD-10-CM

## 2024-01-17 MED ORDER — METRONIDAZOLE 0.75 % EX GEL
CUTANEOUS | 6 refills | Status: DC
Start: 2024-01-17 — End: 2024-07-31

## 2024-01-17 MED ORDER — KETOCONAZOLE 2 % EX CREA
TOPICAL_CREAM | CUTANEOUS | 11 refills | Status: AC
Start: 2024-01-17 — End: ?

## 2024-01-17 MED ORDER — HYDROCORTISONE 2.5 % EX CREA
TOPICAL_CREAM | CUTANEOUS | 11 refills | Status: AC
Start: 2024-01-17 — End: ?

## 2024-01-17 NOTE — Progress Notes (Signed)
 Follow-Up Visit   Subjective  Herbert Molina is a 83 y.o. male who presents for the following: Skin Cancer Screening and Upper Body Skin Exam Patient has some spots today on face, chest, arms, nose, ears, and scalp he would like checked.  Hx of aks, hx of isks, hx of rosacea, hx of bcc and scc  The patient presents for Upper Body Skin Exam (UBSE) for skin cancer screening and mole check. The patient has spots, moles and lesions to be evaluated, some may be new or changing and the patient may have concern these could be cancer.  The following portions of the chart were reviewed this encounter and updated as appropriate: medications, allergies, medical history  Review of Systems:  No other skin or systemic complaints except as noted in HPI or Assessment and Plan.  Objective  Well appearing patient in no apparent distress; mood and affect are within normal limits.  All skin waist up examined. Relevant physical exam findings are noted in the Assessment and Plan.  face x 6, scalp x 14 (20) Erythematous thin papules/macules with gritty scale.  b/l arms x 3, left distal lateral calf above ankle x 1, face, neck x 16 (20) Erythematous stuck-on, waxy papule or plaque  Assessment & Plan   ERYTHEMA INTERTRIGO   Related Medications ketoconazole (NIZORAL) 2 % cream Apply topically to affected rash at lower abdomen nightly on Monday Wednesday and Fridays hydrocortisone 2.5 % cream Apply topically to rash at lower abdomen nightly on Tuesday, Thursday and Saturdays only ACTINIC KERATOSIS (20) face x 6, scalp x 14 (20) Discussed field treatment cream  or red light treatment to scalp with debridement in 6 weeks  to affected areas of scalp  Will schedule in 6 weeks Red Light PDT treatment with debridement to areas of scalp  ACTINIC DAMAGE WITH PRECANCEROUS ACTINIC KERATOSES Counseling for Topical Chemotherapy Management: Patient exhibits: - Severe, confluent actinic changes with  pre-cancerous actinic keratoses that is secondary to cumulative UV radiation exposure over time - Condition that is severe; chronic, not at goal. - diffuse scaly erythematous macules and papules with underlying dyspigmentation - Discussed Prescription "Field Treatment" topical Chemotherapy for Severe, Chronic Confluent Actinic Changes with Pre-Cancerous Actinic Keratoses Field treatment involves treatment of an entire area of skin that has confluent Actinic Changes (Sun/ Ultraviolet light damage) and PreCancerous Actinic Keratoses by method of PhotoDynamic Therapy (PDT) and/or prescription Topical Chemotherapy agents such as 5-fluorouracil, 5-fluorouracil/calcipotriene, and/or imiquimod.  The purpose is to decrease the number of clinically evident and subclinical PreCancerous lesions to prevent progression to development of skin cancer by chemically destroying early precancer changes that may or may not be visible.  It has been shown to reduce the risk of developing skin cancer in the treated area. As a result of treatment, redness, scaling, crusting, and open sores may occur during treatment course. One or more than one of these methods may be used and may have to be used several times to control, suppress and eliminate the PreCancerous changes. Discussed treatment course, expected reaction, and possible side effects. - Recommend daily broad spectrum sunscreen SPF 30+ to sun-exposed areas, reapply every 2 hours as needed.  - Staying in the shade or wearing long sleeves, sun glasses (UVA+UVB protection) and wide brim hats (4-inch brim around the entire circumference of the hat) are also recommended. - Call for new or changing lesions.  Actinic keratoses are precancerous spots that appear secondary to cumulative UV radiation exposure/sun exposure over time. They are chronic  with expected duration over 1 year. A portion of actinic keratoses will progress to squamous cell carcinoma of the skin. It is not  possible to reliably predict which spots will progress to skin cancer and so treatment is recommended to prevent development of skin cancer.  Recommend daily broad spectrum sunscreen SPF 30+ to sun-exposed areas, reapply every 2 hours as needed.  Recommend staying in the shade or wearing long sleeves, sun glasses (UVA+UVB protection) and wide brim hats (4-inch brim around the entire circumference of the hat). Call for new or changing lesions. Destruction of lesion - face x 6, scalp x 14 (20) Complexity: simple   Destruction method: cryotherapy   Informed consent: discussed and consent obtained   Timeout:  patient name, date of birth, surgical site, and procedure verified Lesion destroyed using liquid nitrogen: Yes   Region frozen until ice ball extended beyond lesion: Yes   Outcome: patient tolerated procedure well with no complications   Post-procedure details: wound care instructions given   INFLAMED SEBORRHEIC KERATOSIS (20) b/l arms x 3, left distal lateral calf above ankle x 1, face, neck x 16 (20) VS POROKERATOSIS at left distal lateral calf above ankle  Will recheck at next follow up left distal lateral calf above ankle   Symptomatic, irritating, patient would like treated. Destruction of lesion - b/l arms x 3, left distal lateral calf above ankle x 1, face, neck x 16 (20) Complexity: simple   Destruction method: cryotherapy   Informed consent: discussed and consent obtained   Timeout:  patient name, date of birth, surgical site, and procedure verified Lesion destroyed using liquid nitrogen: Yes   Region frozen until ice ball extended beyond lesion: Yes   Outcome: patient tolerated procedure well with no complications   Post-procedure details: wound care instructions given   ROSACEA   Related Medications metroNIDAZOLE (METROGEL) 0.75 % gel Apply to face at bedtime SKIN CANCER SCREENING   LENTIGO   MELANOCYTIC NEVUS, UNSPECIFIED LOCATION   ACTINIC SKIN  DAMAGE   COUNSELING AND COORDINATION OF CARE   MEDICATION MANAGEMENT   Skin cancer screening performed today.  Actinic Damage - Chronic condition, secondary to cumulative UV/sun exposure - diffuse scaly erythematous macules with underlying dyspigmentation - Recommend daily broad spectrum sunscreen SPF 30+ to sun-exposed areas, reapply every 2 hours as needed.  - Staying in the shade or wearing long sleeves, sun glasses (UVA+UVB protection) and wide brim hats (4-inch brim around the entire circumference of the hat) are also recommended for sun protection.  - Call for new or changing lesions.  Lentigines, Seborrheic Keratoses, Hemangiomas - Benign normal skin lesions - Benign-appearing - Call for any changes  Melanocytic Nevi - Tan-brown and/or pink-flesh-colored symmetric macules and papules - Benign appearing on exam today - Observation - Call clinic for new or changing moles - Recommend daily use of broad spectrum spf 30+ sunscreen to sun-exposed areas.   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 Condition is bothersome/symptomatic for patient. Currently flared. Intertrigo is a chronic recurrent rash that occurs in skin fold areas that may be associated with friction; heat; moisture; yeast; fungus; and bacteria.  It is exacerbated by increased movement / activity; sweating; and higher atmospheric temperature.  Use of an absorbant powder such as Zeasorb AF powder or other OTC antifungal powder to the area daily can prevent rash recurrence. Other options to help keep the area dry include  blow drying the area after bathing or using antiperspirant products such as Duradry sweat minimizing gel. Treatment Plan: Start ketoconazole 2 % cream - apply topically to rash nightly on Monday, Wednesday and Fridays Start hydrocortisone 2.5 % cream apply topically to rash  nighty on Tuesday Thursay and Saturdays   Recommend OTC Zeasorb AF powder to body folds daily after shower.  It is often found in the athlete's foot section in the pharmacy.  Avoid using powders that contain cornstarch.  Sebaceous Hyperplasia - Small yellow papules with a central dell - Benign-appearing - Observe. Call for changes.  EPIDERMAL INCLUSION CYST Exam: Subcutaneous nodule at R axilla 1.0cm Benign-appearing. Exam most consistent with an epidermal inclusion cyst. Discussed that a cyst is a benign growth that can grow over time and sometimes get irritated or inflamed. Recommend observation if it is not bothersome. Discussed option of surgical excision to remove it if it is growing, symptomatic, or other changes noted. Please call for new or changing lesions so they can be evaluated.  FOLLICULITIS Inside nostrils Exam: clear today Chronic condition with duration or expected duration over one year. Currently well-controlled. Treatment Plan: Can continue if flares Mupirocin oint qd prn    Purpura - Chronic; persistent and recurrent.  Treatable, but not curable. - Violaceous macules and patches - Benign - Related to trauma, age, sun damage and/or use of blood thinners, chronic use of topical and/or oral steroids - Observe - Can use OTC arnica containing moisturizer such as Dermend Bruise Formula if desired - Call for worsening or other concerns  Rosacea with Rhinophyma  Face Exam: clear today  Chronic condition with duration or expected duration over one year. Currently well-controlled. Rosacea is a chronic progressive skin condition usually affecting the face of adults, causing redness and/or acne bumps. It is treatable but not curable. It sometimes affects the eyes (ocular rosacea) as well. It may respond to topical and/or systemic medication and can flare with stress, sun exposure, alcohol, exercise and some foods.  Daily application of broad spectrum spf 30+ sunscreen to face  is recommended to reduce flares. Treatment Plan:  Cont Metronidazole 0.75% gel qhs   HISTORY OF BASAL CELL CARCINOMA OF THE SKIN - 09/21/2007 right medial forehead - No evidence of recurrence today - Recommend regular full body skin exams - Recommend daily broad spectrum sunscreen SPF 30+ to sun-exposed areas, reapply every 2 hours as needed.  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  - Call if any new or changing lesions are noted between office visits   Return for 6 - 8 month tbse .  IRandee Busing, CMA, am acting as scribe for Celine Collard, MD.   Documentation: I have reviewed the above documentation for accuracy and completeness, and I agree with the above.  Celine Collard, MD

## 2024-01-17 NOTE — Patient Instructions (Addendum)
 Intertrigo is a chronic recurrent rash that occurs in skin fold areas that may be associated with friction; heat; moisture; yeast; fungus; and bacteria.  It is exacerbated by increased movement / activity; sweating; and higher atmospheric temperature.  Use of an absorbant powder such as Zeasorb AF powder or other OTC antifungal powder to the area daily can prevent rash recurrence. Other options to help keep the area dry include blow drying the area after bathing or using antiperspirant products such as Duradry sweat minimizing gel.  Start ketoconazole cream - apply to affected rash nightly on Monday Wednesday and Fridays Start hydrocortisone cream - apply to affected rash nightly on Tuesday, Thursday and Saturdays     Actinic keratoses are precancerous spots that appear secondary to cumulative UV radiation exposure/sun exposure over time. They are chronic with expected duration over 1 year. A portion of actinic keratoses will progress to squamous cell carcinoma of the skin. It is not possible to reliably predict which spots will progress to skin cancer and so treatment is recommended to prevent development of skin cancer.  Recommend daily broad spectrum sunscreen SPF 30+ to sun-exposed areas, reapply every 2 hours as needed.  Recommend staying in the shade or wearing long sleeves, sun glasses (UVA+UVB protection) and wide brim hats (4-inch brim around the entire circumference of the hat). Call for new or changing lesions.   Cryotherapy Aftercare  Wash gently with soap and water everyday.   Apply Vaseline and Band-Aid daily until healed.     Seborrheic Keratosis  What causes seborrheic keratoses? Seborrheic keratoses are harmless, common skin growths that first appear during adult life.  As time goes by, more growths appear.  Some people may develop a large number of them.  Seborrheic keratoses appear on both covered and uncovered body parts.  They are not caused by sunlight.  The tendency to  develop seborrheic keratoses can be inherited.  They vary in color from skin-colored to gray, brown, or even black.  They can be either smooth or have a rough, warty surface.   Seborrheic keratoses are superficial and look as if they were stuck on the skin.  Under the microscope this type of keratosis looks like layers upon layers of skin.  That is why at times the top layer may seem to fall off, but the rest of the growth remains and re-grows.    Treatment Seborrheic keratoses do not need to be treated, but can easily be removed in the office.  Seborrheic keratoses often cause symptoms when they rub on clothing or jewelry.  Lesions can be in the way of shaving.  If they become inflamed, they can cause itching, soreness, or burning.  Removal of a seborrheic keratosis can be accomplished by freezing, burning, or surgery. If any spot bleeds, scabs, or grows rapidly, please return to have it checked, as these can be an indication of a skin cancer.    Due to recent changes in healthcare laws, you may see results of your pathology and/or laboratory studies on MyChart before the doctors have had a chance to review them. We understand that in some cases there may be results that are confusing or concerning to you. Please understand that not all results are received at the same time and often the doctors may need to interpret multiple results in order to provide you with the best plan of care or course of treatment. Therefore, we ask that you please give [place] 2 business days to thoroughly review all your results  before contacting the office for clarification. Should we see a critical lab result, you will be contacted sooner.   If You Need Anything After Your Visit  If you have any questions or concerns for your doctor, please call our main line at [phone] and press option 4 to reach your doctor's medical assistant. If no one answers, please leave a voicemail as directed and we will return your call as soon  as possible. Messages left after 4 pm will be answered the following business day.   You may also send [place] a message via MyChart. We typically respond to MyChart messages within 1-2 business days.  For prescription refills, please ask your pharmacy to contact our office. Our fax number is [phone].  If you have an urgent issue when the clinic is closed that cannot wait until the next business day, you can page your doctor at the number below.    Please note that while we do our best to be available for urgent issues outside of office hours, we are not available 24/7.   If you have an urgent issue and are unable to reach [place], you may choose to seek medical care at your doctor's office, retail clinic, urgent care center, or emergency room.  If you have a medical emergency, please immediately call 911 or go to the emergency department.  Pager Numbers  - Dr. Gwen Pounds: [phone]  - Dr. Roseanne Reno: [phone]  - Dr. Katrinka Blazing: [phone]   In the event of inclement weather, please call our main line at [phone] for an update on the status of any delays or closures.  Dermatology Medication Tips: Please keep the boxes that topical medications come in in order to help keep track of the instructions about where and how to use these. Pharmacies typically print the medication instructions only on the boxes and not directly on the medication tubes.   If your medication is too expensive, please contact our office at [phone] option 4 or send [place] a message through MyChart.   We are unable to tell what your co-pay for medications will be in advance as this is different depending on your insurance coverage. However, we may be able to find a substitute medication at lower cost or fill out paperwork to get insurance to cover a needed medication.   If a prior authorization is required to get your medication covered by your insurance company, please allow [place] 1-2 business days to complete this  process.  Drug prices often vary depending on where the prescription is filled and some pharmacies may offer cheaper prices.  The website www.goodrx.com contains coupons for medications through different pharmacies. The prices here do not account for what the cost may be with help from insurance (it may be cheaper with your insurance), but the website can give you the price if you did not use any insurance.  - You can print the associated coupon and take it with your prescription to the pharmacy.  - You may also stop by our office during regular business hours and pick up a GoodRx coupon card.  - If you need your prescription sent electronically to a different pharmacy, notify our office through [facility] or by phone at [phone] option 4.     Si Usted Necesita Algo Despus de Su Visita  Tambin puede enviarnos un mensaje a travs de Clinical cytogeneticist. Por lo general respondemos a los mensajes de MyChart en el transcurso de 1 a 2 das hbiles.  Para renovar recetas, por favor  pida a su farmacia que se ponga en contacto con nuestra oficina. Franz Jacks de fax es [place] [phone].  Si tiene un asunto urgente cuando la clnica est cerrada y que no puede esperar hasta el siguiente da hbil, puede llamar/localizar a su doctor(a) al nmero que aparece a continuacin.   Por favor, tenga en cuenta que aunque hacemos todo lo posible para estar disponibles para asuntos urgentes fuera del horario de [place], no estamos disponibles las 24 horas del da, los 7 [address]  [address] de la [place].   Si tiene un problema urgente y no puede comunicarse con nosotros, puede optar por buscar atencin mdica  en el consultorio de su doctor(a), en una clnica privada, en un centro de atencin urgente o en una sala de emergencias.  Si tiene Engineer, drilling, por favor llame inmediatamente al 911 o vaya a la sala de emergencias.  Nmeros de bper  - Dr. Bary Likes: [phone]  - Dra. Annette Barters: [phone]  - Dr.  Felipe Horton: [phone]   En caso de inclemencias del tiempo, por favor llame a Lajuan Pila principal al [phone] para una actualizacin sobre el [place] de cualquier retraso o cierre.  Consejos para la medicacin en dermatologa: Por favor, guarde las cajas en las que vienen los medicamentos de uso tpico para ayudarle a seguir las instrucciones sobre dnde y cmo usarlos. Las farmacias generalmente imprimen las instrucciones del medicamento slo en las cajas y no directamente en los tubos del [place].   Si su medicamento es muy caro, por favor, pngase en contacto con Bettyjane Brunet llamando al [phone] y presione la opcin 4 o envenos un mensaje a travs de Clinical cytogeneticist.   No podemos decirle cul ser su copago por los medicamentos por adelantado ya que esto es diferente dependiendo de la cobertura de su seguro. Sin embargo, es posible que podamos encontrar un medicamento sustituto a Audiological scientist un formulario para que el seguro cubra el medicamento que se considera necesario.   Si se requiere una autorizacin previa para que su compaa de seguros [place] su medicamento, por favor permtanos de 1 a 2 das hbiles para completar este proceso.  Los precios de los medicamentos varan con frecuencia dependiendo del Environmental consultant de dnde se surte la receta y alguna farmacias pueden ofrecer precios ms baratos.  El sitio web www.goodrx.com tiene cupones para medicamentos de Health and safety inspector. Los precios aqu no tienen en cuenta lo que podra costar con la ayuda del seguro (puede ser ms barato con su seguro), pero el sitio web puede darle el precio si no utiliz Tourist information centre manager.  - Puede imprimir el cupn correspondiente y llevarlo con su receta a la farmacia.  - Tambin puede pasar por nuestra oficina durante el horario de atencin regular y Education officer, museum una tarjeta de cupones de GoodRx.  - Si necesita que su receta se enve electrnicamente a una farmacia diferente, informe a nuestra oficina a  travs de MyChart de [place] o por telfono llamando al [phone] y presione la opcin 4.

## 2024-01-27 DIAGNOSIS — M0609 Rheumatoid arthritis without rheumatoid factor, multiple sites: Secondary | ICD-10-CM | POA: Diagnosis not present

## 2024-01-27 DIAGNOSIS — I10 Essential (primary) hypertension: Secondary | ICD-10-CM | POA: Diagnosis not present

## 2024-01-27 DIAGNOSIS — R0602 Shortness of breath: Secondary | ICD-10-CM | POA: Diagnosis not present

## 2024-01-27 DIAGNOSIS — M150 Primary generalized (osteo)arthritis: Secondary | ICD-10-CM | POA: Diagnosis not present

## 2024-01-27 DIAGNOSIS — Z79899 Other long term (current) drug therapy: Secondary | ICD-10-CM | POA: Diagnosis not present

## 2024-01-27 DIAGNOSIS — R600 Localized edema: Secondary | ICD-10-CM | POA: Diagnosis not present

## 2024-01-27 DIAGNOSIS — E782 Mixed hyperlipidemia: Secondary | ICD-10-CM | POA: Diagnosis not present

## 2024-02-28 ENCOUNTER — Encounter

## 2024-02-28 ENCOUNTER — Ambulatory Visit

## 2024-03-01 ENCOUNTER — Encounter

## 2024-03-01 ENCOUNTER — Ambulatory Visit

## 2024-03-01 DIAGNOSIS — J019 Acute sinusitis, unspecified: Secondary | ICD-10-CM | POA: Diagnosis not present

## 2024-03-01 DIAGNOSIS — R051 Acute cough: Secondary | ICD-10-CM | POA: Diagnosis not present

## 2024-03-01 DIAGNOSIS — Z03818 Encounter for observation for suspected exposure to other biological agents ruled out: Secondary | ICD-10-CM | POA: Diagnosis not present

## 2024-03-12 ENCOUNTER — Ambulatory Visit

## 2024-03-12 ENCOUNTER — Encounter

## 2024-03-14 ENCOUNTER — Encounter

## 2024-03-14 ENCOUNTER — Encounter: Payer: Self-pay | Admitting: Dermatology

## 2024-03-14 ENCOUNTER — Ambulatory Visit: Admitting: Dermatology

## 2024-03-14 DIAGNOSIS — L570 Actinic keratosis: Secondary | ICD-10-CM

## 2024-03-14 DIAGNOSIS — W908XXA Exposure to other nonionizing radiation, initial encounter: Secondary | ICD-10-CM

## 2024-03-14 DIAGNOSIS — L820 Inflamed seborrheic keratosis: Secondary | ICD-10-CM

## 2024-03-14 DIAGNOSIS — L821 Other seborrheic keratosis: Secondary | ICD-10-CM

## 2024-03-14 DIAGNOSIS — L578 Other skin changes due to chronic exposure to nonionizing radiation: Secondary | ICD-10-CM

## 2024-03-14 MED ORDER — AMINOLEVULINIC ACID HCL 10 % EX GEL
2000.0000 mg | Freq: Once | CUTANEOUS | Status: AC
Start: 2024-03-14 — End: 2024-03-14
  Administered 2024-03-14: 2000 mg via TOPICAL

## 2024-03-14 NOTE — Patient Instructions (Addendum)
 Ameluz/Red Light Treatment Common Side Effects  - Burning/stinging, which may be severe and last up to 24-72 hours after your treatment  - Redness, swelling and/or peeling which may last up to 4 weeks  - Scaling/crusting which may last up to 2 weeks  - Sun sensitivity (you MUST avoid sun exposure for 48-72 hours after treatment)  Care Instructions  - Okay to wash with soap and water and shampoo as normal  - If needed, you can do a cold compress (ex. Ice packs) for comfort  - If okay with your Primary Doctor, you may use analgesics such as Tylenol every 4-6 hours, not to exceed recommended dose  - You may apply Cerave Healing Ointment, Vaseline or Aquaphor  - If you have a lot of swelling you may take a Benadryl to help with this (this may cause drowsiness)  Sun Precautions  - Wear a wide brim hat for the next week if outside  - Wear a sunblock with zinc or titanium dioxide at least SPF 50 daily   We will recheck you in 10-12 weeks. If any problems, please call the office and ask to speak with a nurse.  Cryotherapy Aftercare  Wash gently with soap and water everyday.   Apply Vaseline and Band-Aid daily until healed.    Due to recent changes in healthcare laws, you may see results of your pathology and/or laboratory studies on MyChart before the doctors have had a chance to review them. We understand that in some cases there may be results that are confusing or concerning to you. Please understand that not all results are received at the same time and often the doctors may need to interpret multiple results in order to provide you with the best plan of care or course of treatment. Therefore, we ask that you please give [place] 2 business days to thoroughly review all your results before contacting the office for clarification. Should we see a critical lab result, you will be contacted sooner.   If You Need Anything After Your Visit  If you have any questions or concerns for your  doctor, please call our main line at [phone] and press option 4 to reach your doctor's medical assistant. If no one answers, please leave a voicemail as directed and we will return your call as soon as possible. Messages left after 4 pm will be answered the following business day.   You may also send [place] a message via MyChart. We typically respond to MyChart messages within 1-2 business days.  For prescription refills, please ask your pharmacy to contact our office. Our fax number is [phone].  If you have an urgent issue when the clinic is closed that cannot wait until the next business day, you can page your doctor at the number below.    Please note that while we do our best to be available for urgent issues outside of office hours, we are not available 24/7.   If you have an urgent issue and are unable to reach [place], you may choose to seek medical care at your doctor's office, retail clinic, urgent care center, or emergency room.  If you have a medical emergency, please immediately call 911 or go to the emergency department.  Pager Numbers  - Dr. Gwen Pounds: [phone]  - Dr. Roseanne Reno: [phone]  - Dr. Katrinka Blazing: [phone]   In the event of inclement weather, please call our main line at [phone] for an update on the status of any delays or closures.  Dermatology  Medication Tips: Please keep the boxes that topical medications come in in order to help keep track of the instructions about where and how to use these. Pharmacies typically print the medication instructions only on the boxes and not directly on the medication tubes.   If your medication is too expensive, please contact our office at [phone] option 4 or send [place] a message through MyChart.   We are unable to tell what your co-pay for medications will be in advance as this is different depending on your insurance coverage. However, we may be able to find a substitute medication at lower cost or fill out  paperwork to get insurance to cover a needed medication.   If a prior authorization is required to get your medication covered by your insurance company, please allow [place] 1-2 business days to complete this process.  Drug prices often vary depending on where the prescription is filled and some pharmacies may offer cheaper prices.  The website www.goodrx.com contains coupons for medications through different pharmacies. The prices here do not account for what the cost may be with help from insurance (it may be cheaper with your insurance), but the website can give you the price if you did not use any insurance.  - You can print the associated coupon and take it with your prescription to the pharmacy.  - You may also stop by our office during regular business hours and pick up a GoodRx coupon card.  - If you need your prescription sent electronically to a different pharmacy, notify our office through [facility] or by phone at [phone] option 4.     Si Usted Necesita Algo Despus de Su Visita  Tambin puede enviarnos un mensaje a travs de Clinical cytogeneticist. Por lo general respondemos a los mensajes de MyChart en el transcurso de 1 a 2 das hbiles.  Para renovar recetas, por favor pida a su farmacia que se ponga en contacto con nuestra oficina. Annie Sable de fax es [place] [phone].  Si tiene un asunto urgente cuando la clnica est cerrada y que no puede esperar hasta el siguiente da hbil, puede llamar/localizar a su doctor(a) al nmero que aparece a continuacin.   Por favor, tenga en cuenta que aunque hacemos todo lo posible para estar disponibles para asuntos urgentes fuera del horario de [employer], no estamos disponibles las 24 horas del da, los 7 [address]  [address] de la [place].   Si tiene un problema urgente y no puede comunicarse con nosotros, puede optar por buscar atencin mdica  en el consultorio de su doctor(a), en una clnica privada, en un centro de atencin urgente o en una sala de  emergencias.  Si tiene Engineer, drilling, por favor llame inmediatamente al 911 o vaya a la sala de emergencias.  Nmeros de bper  - Dr. Gwen Pounds: [phone]  - Dra. Roseanne Reno: [phone]  - Dr. Katrinka Blazing: [phone]   En caso de inclemencias del tiempo, por favor llame a Lacy Duverney principal al [phone] para una actualizacin sobre el [place] de cualquier retraso o cierre.  Consejos para la medicacin en dermatologa: Por favor, guarde las cajas en las que vienen los medicamentos de uso tpico para ayudarle a seguir las instrucciones sobre dnde y cmo usarlos. Las farmacias generalmente imprimen las instrucciones del medicamento slo en las cajas y no directamente en los tubos del [place].   Si su medicamento es muy caro, por favor, pngase en contacto con Rolm Gala llamando al [phone] y presione la opcin 4 o envenos un  mensaje a travs de MyChart.   No podemos decirle cul ser su copago por los medicamentos por adelantado ya que esto es diferente dependiendo de la cobertura de su seguro. Sin embargo, es posible que podamos encontrar un medicamento sustituto a Audiological scientist un formulario para que el seguro cubra el medicamento que se considera necesario.   Si se requiere una autorizacin previa para que su compaa de seguros [place] su medicamento, por favor permtanos de 1 a 2 das hbiles para completar [address]  Los precios de los medicamentos varan con frecuencia dependiendo del Environmental consultant de dnde se surte la receta y alguna farmacias pueden ofrecer precios ms baratos.  El sitio web www.goodrx.com tiene cupones para medicamentos de Health and safety inspector. Los precios aqu no tienen en cuenta lo que podra costar con la ayuda del seguro (puede ser ms barato con su seguro), pero el sitio web puede darle el precio si no utiliz Tourist information centre manager.  - Puede imprimir el cupn correspondiente y llevarlo con su receta a la farmacia.  - Tambin puede pasar por  nuestra oficina durante el horario de atencin regular y Education officer, museum una tarjeta de cupones de GoodRx.  - Si necesita que su receta se enve electrnicamente a una farmacia diferente, informe a nuestra oficina a travs de MyChart de [place] o por telfono llamando al [phone] y presione la opcin 4.

## 2024-03-14 NOTE — Progress Notes (Signed)
 Follow-Up Visit   Subjective  Herbert Molina is a 83 y.o. male who presents for the following: red light to the scalp The patient has spots, moles and lesions to be evaluated, some may be new or changing and the patient may have concern these could be cancer.  The following portions of the chart were reviewed this encounter and updated as appropriate: medications, allergies, medical history  Review of Systems:  No other skin or systemic complaints except as noted in HPI or Assessment and Plan.  Objective  Well appearing patient in no apparent distress; mood and affect are within normal limits.  A focused examination was performed of the following areas: scalp and right arm.  Relevant exam findings are noted in the Assessment and Plan.  right arm x 2 (2) Stuck-on, waxy, tan-brown papules and plaques -- Discussed benign etiology and prognosis.  scalp x 3 (3) Erythematous thin papules/macules with gritty scale.   Assessment & Plan   INFLAMED SEBORRHEIC KERATOSIS (2) right arm x 2 (2) Symptomatic, irritating, patient would like treated.  Destruction of lesion - right arm x 2 (2) Complexity: simple   Destruction method: cryotherapy   Informed consent: discussed and consent obtained   Timeout:  patient name, date of birth, surgical site, and procedure verified Lesion destroyed using liquid nitrogen: Yes   Region frozen until ice ball extended beyond lesion: Yes   Outcome: patient tolerated procedure well with no complications   Post-procedure details: wound care instructions given   HYPERTROPHIC ACTINIC KERATOSIS (3) scalp x 3 (3) ACTINIC DAMAGE - chronic, secondary to cumulative UV radiation exposure/sun exposure over time - diffuse scaly erythematous macules with underlying dyspigmentation - Recommend daily broad spectrum sunscreen SPF 30+ to sun-exposed areas, reapply every 2 hours as needed.  - Recommend staying in the shade or wearing long sleeves, sun glasses (UVA+UVB  protection) and wide brim hats (4-inch brim around the entire circumference of the hat). - Call for new or changing lesions.  Destruction of lesion - scalp x 3 (3) Complexity: simple   Destruction method: cryotherapy   Informed consent: discussed and consent obtained   Timeout:  patient name, date of birth, surgical site, and procedure verified Lesion destroyed using liquid nitrogen: Yes   Region frozen until ice ball extended beyond lesion: Yes   Outcome: patient tolerated procedure well with no complications   Post-procedure details: wound care instructions given   ACTINIC KERATOSIS   Related Medications Aminolevulinic Acid HCl 10 % GEL 2,000 mg  ACTINIC SKIN DAMAGE   SEBORRHEIC KERATOSIS    Patient completed red light phototherapy with debridement today.  SEBORRHEIC KERATOSIS - Stuck-on, waxy, tan-brown papules and/or plaques  - Benign-appearing - Discussed benign etiology and prognosis. - Observe - Call for any changes  ACTINIC KERATOSES Exam: Erythematous thin papules/macules with gritty scale.  Treatment Plan:  Red Light Photodynamic therapy  Procedure discussed: discussed risks, benefits, side effects. and alternatives   Prep: site scrubbed/prepped with acetone   Debridement needed: Yes (performed by Physician with sand paper.  (CPT [id number])) Location:  scalp Number of lesions:  Multiple (> 15) Type of treatment:  Red light Aminolevulinic Acid (see MAR for details): Ameluz Aminolevulinic Acid comment:  [id number] Amount of Ameluz (mg):  1 Incubation time (minutes):  120 Number of minutes under lamp:  1 minute and 15 seconds. Patient did not complete treatment.  Cooling:  Fan Outcome: patient did not tolerated procedure Post-procedure details: sunscreen applied and aftercare instructions given to patient  Related Medications Aminolevulinic Acid HCl 10 % GEL 2,000 mg  I, Lisbeth Rides, RMA, am acting as scribe for Celine Collard, MD.     Documentation: I have  reviewed the above documentation for accuracy and completeness, and I agree with the above.  Celine Collard, MD

## 2024-03-19 ENCOUNTER — Telehealth: Payer: Self-pay | Admitting: Urology

## 2024-03-19 NOTE — Telephone Encounter (Signed)
 I spoke with Herbert Molina @ [employer] who told me we could request patient records from them at the following e-mail:  [email or web address]  Pt has appt w/Dr. Estanislao Heimlich 6/30 @ 10:45

## 2024-03-26 NOTE — Telephone Encounter (Signed)
 Pt called back asking if we received records.  We have not, so I sent another medical records request to [facility].

## 2024-03-30 DIAGNOSIS — M25569 Pain in unspecified knee: Secondary | ICD-10-CM | POA: Diagnosis not present

## 2024-03-30 DIAGNOSIS — E119 Type 2 diabetes mellitus without complications: Secondary | ICD-10-CM | POA: Diagnosis not present

## 2024-03-30 DIAGNOSIS — E785 Hyperlipidemia, unspecified: Secondary | ICD-10-CM | POA: Diagnosis not present

## 2024-03-30 DIAGNOSIS — Z0000 Encounter for general adult medical examination without abnormal findings: Secondary | ICD-10-CM | POA: Diagnosis not present

## 2024-03-30 DIAGNOSIS — Z79899 Other long term (current) drug therapy: Secondary | ICD-10-CM | POA: Diagnosis not present

## 2024-03-30 DIAGNOSIS — I10 Essential (primary) hypertension: Secondary | ICD-10-CM | POA: Diagnosis not present

## 2024-03-30 DIAGNOSIS — Z1331 Encounter for screening for depression: Secondary | ICD-10-CM | POA: Diagnosis not present

## 2024-03-30 DIAGNOSIS — N400 Enlarged prostate without lower urinary tract symptoms: Secondary | ICD-10-CM | POA: Diagnosis not present

## 2024-04-02 ENCOUNTER — Ambulatory Visit: Admitting: Urology

## 2024-04-02 VITALS — BP 126/66 | HR 67 | Ht 68.0 in | Wt 168.0 lb

## 2024-04-02 DIAGNOSIS — Z125 Encounter for screening for malignant neoplasm of prostate: Secondary | ICD-10-CM | POA: Diagnosis not present

## 2024-04-02 DIAGNOSIS — N529 Male erectile dysfunction, unspecified: Secondary | ICD-10-CM

## 2024-04-02 MED ORDER — TADALAFIL 10 MG PO TABS: 10.0000 mg | ORAL_TABLET | Freq: Every day | ORAL | 11 refills | Status: AC | PRN

## 2024-04-02 NOTE — Patient Instructions (Signed)
 Take Cialis 10 mg as needed 1 hour prior to sexual activity.  If no improvement you can increase the dose to 20 mg 1 hour prior to sexual activity.  If you are still unable to achieve erections, you can take a 10 mg dose daily, with an additional 10 mg dose 1 hour prior to sexual activity.

## 2024-04-02 NOTE — Progress Notes (Signed)
   04/02/24 11:58 AM   Herbert Molina 1941/07/03 [id number]  CC: ED, PSA screening, history of BPH  HPI: 83 year old male who was previously followed by Dr. Gala, underwent greenlight laser PVP in 2016.  Here today for ED.  Has never tried medications for this previously.  Has had ED for many years.  No prior testosterone values to review.  He denies any significant urinary symptoms. He has continue to have PSAs checked by PCP, most recently normal at 1.98.  Recent urinalysis with PCP last week was benign.   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   L forearm, EDC   Vertigo     Surgical History: Past Surgical History:  Procedure Laterality Date   HAMMER TOE SURGERY Left    HERNIA REPAIR     umbilical   KNEE ARTHROPLASTY Right 10/27/2015   Procedure: COMPUTER ASSISTED TOTAL KNEE ARTHROPLASTY;  Surgeon: Herbert SHAUNNA Hue, MD;  Location: [facility] ORS;  Service: Orthopedics;  Laterality: Right;   KNEE ARTHROSCOPY WITH MEDIAL MENISECTOMY Right 02/18/2015   Procedure: KNEE ARTHROSCOPY WITH MEDIAL MENISECTOMY;  Surgeon: Herbert Sharps, MD;  Location: [facility] ORS;  Service: Orthopedics;  Laterality: Right;   LASER OF PROSTATE W/ GREEN LIGHT PVP     SEPTOPLASTY     SHOULDER ARTHROSCOPY WITH CAPSULORRHAPHY Right    Family History: Family History  Problem Relation Age of Onset   Stroke Mother    Heart attack Father    Stroke Father    Heart attack Brother     Social History:  reports that he has never smoked. He has never used smokeless tobacco. He reports that he does not drink alcohol and does not use drugs.  Physical Exam: BP 126/66   Pulse 67   Ht 5' 8 (1.727 m)   Wt 168 lb (76.2 kg)   BMI 25.54 kg/m     Constitutional:  Alert and oriented, No acute distress. Cardiovascular: No clubbing, cyanosis, or edema. Respiratory: Normal respiratory effort, no increased work of breathing. GI: Abdomen is soft, nontender, nondistended, no abdominal masses   Laboratory Data: Reviewed, see HPI  Assessment & Plan:   83 year old male previously followed by Dr. Gala for BPH and PSA screening, minimal urinary symptoms today, PSA normal, primary complaint is ED.  Has never tried medications for this.  Not a good candidate for ongoing PSA screening based on age, would not recommend further PSA values.  I think we need to have realistic expectations with his age and comorbidities.  Reasonable to trial Cialis 10 to 20 mg on demand, could consider testosterone and follow-up.  Trial of Cialis 10 to 20 mg on demand RTC 4 weeks symptom check   Herbert Burnet, MD 04/02/2024  [facility] [address] [place], [place] [address] [phone]

## 2024-04-09 DIAGNOSIS — M79674 Pain in right toe(s): Secondary | ICD-10-CM | POA: Diagnosis not present

## 2024-04-09 DIAGNOSIS — B351 Tinea unguium: Secondary | ICD-10-CM | POA: Diagnosis not present

## 2024-04-09 DIAGNOSIS — M79675 Pain in left toe(s): Secondary | ICD-10-CM | POA: Diagnosis not present

## 2024-04-13 DIAGNOSIS — G8929 Other chronic pain: Secondary | ICD-10-CM | POA: Diagnosis not present

## 2024-04-13 DIAGNOSIS — M7581 Other shoulder lesions, right shoulder: Secondary | ICD-10-CM | POA: Diagnosis not present

## 2024-04-13 DIAGNOSIS — M1712 Unilateral primary osteoarthritis, left knee: Secondary | ICD-10-CM | POA: Diagnosis not present

## 2024-04-30 ENCOUNTER — Ambulatory Visit: Admitting: Physician Assistant

## 2024-05-07 ENCOUNTER — Ambulatory Visit: Admitting: Physician Assistant

## 2024-05-15 DIAGNOSIS — H353132 Nonexudative age-related macular degeneration, bilateral, intermediate dry stage: Secondary | ICD-10-CM | POA: Diagnosis not present

## 2024-05-15 DIAGNOSIS — H40033 Anatomical narrow angle, bilateral: Secondary | ICD-10-CM | POA: Diagnosis not present

## 2024-05-15 DIAGNOSIS — H40003 Preglaucoma, unspecified, bilateral: Secondary | ICD-10-CM | POA: Diagnosis not present

## 2024-05-15 DIAGNOSIS — H2513 Age-related nuclear cataract, bilateral: Secondary | ICD-10-CM | POA: Diagnosis not present

## 2024-05-28 DIAGNOSIS — M1611 Unilateral primary osteoarthritis, right hip: Secondary | ICD-10-CM | POA: Diagnosis not present

## 2024-05-28 DIAGNOSIS — M47816 Spondylosis without myelopathy or radiculopathy, lumbar region: Secondary | ICD-10-CM | POA: Diagnosis not present

## 2024-05-28 DIAGNOSIS — M5416 Radiculopathy, lumbar region: Secondary | ICD-10-CM | POA: Diagnosis not present

## 2024-05-28 DIAGNOSIS — M25551 Pain in right hip: Secondary | ICD-10-CM | POA: Diagnosis not present

## 2024-05-30 DIAGNOSIS — M150 Primary generalized (osteo)arthritis: Secondary | ICD-10-CM | POA: Diagnosis not present

## 2024-05-30 DIAGNOSIS — Z79899 Other long term (current) drug therapy: Secondary | ICD-10-CM | POA: Diagnosis not present

## 2024-05-30 DIAGNOSIS — M0609 Rheumatoid arthritis without rheumatoid factor, multiple sites: Secondary | ICD-10-CM | POA: Diagnosis not present

## 2024-06-06 ENCOUNTER — Ambulatory Visit: Admitting: Physician Assistant

## 2024-06-07 ENCOUNTER — Other Ambulatory Visit: Payer: Self-pay | Admitting: Student

## 2024-06-07 DIAGNOSIS — G5711 Meralgia paresthetica, right lower limb: Secondary | ICD-10-CM

## 2024-06-07 DIAGNOSIS — M4807 Spinal stenosis, lumbosacral region: Secondary | ICD-10-CM

## 2024-06-07 DIAGNOSIS — M5416 Radiculopathy, lumbar region: Secondary | ICD-10-CM

## 2024-06-07 DIAGNOSIS — M47816 Spondylosis without myelopathy or radiculopathy, lumbar region: Secondary | ICD-10-CM

## 2024-06-19 ENCOUNTER — Ambulatory Visit: Admission: RE | Admit: 2024-06-19 | Source: Ambulatory Visit

## 2024-06-29 DIAGNOSIS — N138 Other obstructive and reflux uropathy: Secondary | ICD-10-CM | POA: Diagnosis not present

## 2024-06-29 DIAGNOSIS — I10 Essential (primary) hypertension: Secondary | ICD-10-CM | POA: Diagnosis not present

## 2024-06-29 DIAGNOSIS — M0609 Rheumatoid arthritis without rheumatoid factor, multiple sites: Secondary | ICD-10-CM | POA: Diagnosis not present

## 2024-06-29 DIAGNOSIS — N401 Enlarged prostate with lower urinary tract symptoms: Secondary | ICD-10-CM | POA: Diagnosis not present

## 2024-06-29 DIAGNOSIS — E782 Mixed hyperlipidemia: Secondary | ICD-10-CM | POA: Diagnosis not present

## 2024-06-29 DIAGNOSIS — Z79899 Other long term (current) drug therapy: Secondary | ICD-10-CM | POA: Diagnosis not present

## 2024-06-29 DIAGNOSIS — E119 Type 2 diabetes mellitus without complications: Secondary | ICD-10-CM | POA: Diagnosis not present

## 2024-07-01 ENCOUNTER — Ambulatory Visit
Admission: RE | Admit: 2024-07-01 | Discharge: 2024-07-01 | Disposition: A | Discharge status: Home / Self Care | Source: Ambulatory Visit | Attending: Student | Admitting: Student

## 2024-07-01 DIAGNOSIS — M47816 Spondylosis without myelopathy or radiculopathy, lumbar region: Secondary | ICD-10-CM | POA: Insufficient documentation

## 2024-07-01 DIAGNOSIS — M5416 Radiculopathy, lumbar region: Secondary | ICD-10-CM | POA: Insufficient documentation

## 2024-07-01 DIAGNOSIS — M5136 Other intervertebral disc degeneration, lumbar region with discogenic back pain only: Secondary | ICD-10-CM | POA: Diagnosis not present

## 2024-07-01 DIAGNOSIS — M4807 Spinal stenosis, lumbosacral region: Secondary | ICD-10-CM | POA: Insufficient documentation

## 2024-07-01 DIAGNOSIS — G5711 Meralgia paresthetica, right lower limb: Secondary | ICD-10-CM | POA: Insufficient documentation

## 2024-07-01 DIAGNOSIS — M48061 Spinal stenosis, lumbar region without neurogenic claudication: Secondary | ICD-10-CM | POA: Diagnosis not present

## 2024-07-01 DIAGNOSIS — R262 Difficulty in walking, not elsewhere classified: Secondary | ICD-10-CM | POA: Diagnosis not present

## 2024-07-09 ENCOUNTER — Ambulatory Visit: Admitting: Physician Assistant

## 2024-07-11 ENCOUNTER — Encounter: Payer: Self-pay | Admitting: Physician Assistant

## 2024-07-11 ENCOUNTER — Ambulatory Visit: Admitting: Physician Assistant

## 2024-07-11 ENCOUNTER — Ambulatory Visit

## 2024-07-11 VITALS — BP 138/66 | HR 62 | Ht 68.0 in | Wt 171.6 lb

## 2024-07-11 DIAGNOSIS — R868 Other abnormal findings in specimens from male genital organs: Secondary | ICD-10-CM

## 2024-07-11 DIAGNOSIS — N529 Male erectile dysfunction, unspecified: Secondary | ICD-10-CM

## 2024-07-11 DIAGNOSIS — K6289 Other specified diseases of anus and rectum: Secondary | ICD-10-CM

## 2024-07-11 NOTE — Progress Notes (Signed)
 07/11/2024 3:55 PM   Herbert Molina 26-Sep-1941 [id number]  CC: Chief Complaint  Patient presents with   Erectile Dysfunction   HPI: Herbert Molina is a 83 y.o. male with PMH BPH s/p greenlight PVP in 2016 and ED who presents today for follow-up on demand dose tadalafil .   Today he reports he has not tried the tadalafil .  A friend of his died from an MI after taking Viagra, so he hesitates to take these medications.  He describes chronic ED and decreased volume of ejaculation and wonders if his prior prostate procedures may have contributed.  He is a widower, and would like to be sexually active with his girlfriend. SHIM 2 as below. IPSS 7/pleased.  He also mentions a chronic history of a knot on his anus.  It is not painful.  He would like to have it evaluated.   SHIM     Row Name 07/11/24 1715         SHIM: Over the last 6 months:   How do you rate your confidence that you could get and keep an erection? Very Low     When you had erections with sexual stimulation, how often were your erections hard enough for penetration (entering your partner)? Almost Never or Never     During sexual intercourse, how often were you able to maintain your erection after you had penetrated (entered) your partner? No Sexual Activity     During sexual intercourse, how difficult was it to maintain your erection to completion of intercourse? Did not attempt intercourse     When you attempted sexual intercourse, how often was it satisfactory for you? Did not attempt intercourse       SHIM Total Score   SHIM 2         IPSS     Row Name 07/11/24 1700         International Prostate Symptom Score   How often have you had the sensation of not emptying your bladder? Less than 1 in 5     How often have you had to urinate less than every two hours? Less than 1 in 5 times     How often have you found you stopped and started again several times when you urinated? Less than 1 in 5 times     How  often have you found it difficult to postpone urination? Less than half the time     How often have you had a weak urinary stream? Less than half the time     How often have you had to strain to start urination? Not at All     How many times did you typically get up at night to urinate? None     Total IPSS Score 7       Quality of Life due to urinary symptoms   If you were to spend the rest of your life with your urinary condition just the way it is now how would you feel about that? 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   L forearm, EDC   Vertigo  Surgical History: Past Surgical History:  Procedure Laterality Date   HAMMER TOE SURGERY Left    HERNIA REPAIR     umbilical   KNEE ARTHROPLASTY Right 10/27/2015   Procedure: COMPUTER ASSISTED TOTAL KNEE ARTHROPLASTY;  Surgeon: Lynwood SHAUNNA Hue, MD;  Location: [facility] OR[facility];  Service: Orthopedics;  Laterality: Right;   KNEE ARTHROSCOPY WITH MEDIAL MENISECTOMY Right 02/18/2015   Procedure: KNEE ARTHROSCOPY WITH MEDIAL MENISECTOMY;  Surgeon: Lonni Sharps, MD;  Location: [facility] OR[facility];  Service: Orthopedics;  Laterality: Right;   LASER OF PROSTATE W/ GREEN LIGHT PVP     SEPTOPLASTY     SHOULDER ARTHROSCOPY WITH CAPSULORRHAPHY Right     Home Medications:  Allergies as of 07/11/2024   No Known Allergies      Medication List        Accurate as of July 11, 2024  3:55 PM. If you have any questions, ask your nurse or doctor.          acyclovir  ointment 5 % Commonly known as: ZOVIRAX  Apply 1 application topically daily as needed.   ascorbic acid 1000 MG tablet Commonly known as: VITAMIN C Take 1,000 mg by mouth.   aspirin EC 81 MG  tablet Take 81 mg by mouth daily.   atorvastatin  10 MG tablet Commonly known as: LIPITOR Take 10 mg by mouth at bedtime.   azithromycin 250 MG tablet Commonly known as: ZITHROMAX   benzonatate 200 MG capsule Commonly known as: TESSALON Take 200 mg by mouth 3 (three) times daily as needed.   CeleBREX  200 MG capsule Generic drug: celecoxib  Take 1 capsule by mouth daily.   cholecalciferol  1000 units tablet Commonly known as: VITAMIN D  Take 2,000 Units by mouth daily.   clotrimazole 1 % cream Commonly known as: LOTRIMIN APPLY APPLY A SMALL AMOUNT TO AFFECTED AREA TWICE A DAY   clotrimazole-betamethasone  cream Commonly known as: LOTRISONE Apply topically 2 (two) times daily.   co-enzyme Q-10 30 MG capsule Take 100 mg by mouth daily.   [employer] LEG CRAMPS PAIN RELIEF PO Take 1 tablet by mouth daily as needed.   docusate calcium  240 MG capsule Commonly known as: SURFAK Take 240 mg by mouth.   docusate sodium  100 MG capsule Commonly known as: COLACE Take 100 mg by mouth at bedtime.   donepezil  5 MG tablet Commonly known as: ARICEPT  Take 5 mg by mouth at bedtime.   doxycycline 100 MG tablet Commonly known as: VIBRA-TABS Take 100 mg by mouth 2 (two) times daily.   Elderberry Immune Health Gummy 50-45-3.8 MG Chew Generic drug: Elderberry-Vitamin C-Zinc  Chew by mouth.   enoxaparin  40 MG/0.4ML injection Commonly known as: LOVENOX  Inject 0.4 mLs (40 mg total) into the skin daily.   finasteride 5 MG tablet Commonly known as: PROSCAR Take 1 tablet by mouth daily.   fluconazole  200 MG tablet Commonly known as: DIFLUCAN  Take 1 tablet (200 mg total) by mouth 3 (three) times a week.   furosemide 20 MG tablet Commonly known as: LASIX Take 20 mg by mouth daily.   Garlic 1000 MG Caps Take 1,000 mg by mouth 2 (two) times daily.   HYDROcodone bit-homatropine 5-1.5 MG/5ML syrup Commonly known as: HYCODAN   HYDROcodone-acetaminophen  5-325 MG tablet Commonly known as:  NORCO/VICODIN Take 1 tablet by mouth every 6 (six) hours as needed for moderate pain.   hydrocortisone  2.5 % cream Apply topically to rash at lower abdomen nightly on Tuesday, Thursday and Saturdays only   hydrocortisone  25 MG suppository Commonly known as: ANUSOL -HC  INSERT 1 SUPPOSITORY RECTALLY TWICE A DAY   ibuprofen 200 MG tablet Commonly known as: ADVIL Take 200 mg by mouth.   ketoconazole  2 % cream Commonly known as: NIZORAL  Apply topically to affected rash at lower abdomen nightly on Monday Wednesday and Fridays   levofloxacin 250 MG tablet Commonly known as: LEVAQUIN   LORazepam 1 MG tablet Commonly known as: ATIVAN   meclizine  25 MG tablet Commonly known as: ANTIVERT  Take 25 mg by mouth 3 (three) times daily as needed for dizziness.   MegaRed Omega-3 Krill Oil 350 MG Caps Take 1 capsule by mouth daily.   meloxicam 15 MG tablet Commonly known as: MOBIC Take 1 tablet every day by oral route with meals.   methotrexate 2.5 MG tablet Commonly known as: RHEUMATREX Take 6  tabs (15 mg ), all tabs once a week   metoprolol succinate 25 MG 24 hr tablet Commonly known as: TOPROL-XL Take 25 mg by mouth daily.   metroNIDAZOLE  0.75 % gel Commonly known as: METROGEL  Apply to face at bedtime   Multi-Vitamin tablet Take 1 tablet by mouth daily.   mupirocin  ointment 2 % Commonly known as: BACTROBAN  Apply 1 Application topically daily. Qd to excision site   naproxen  sodium 220 MG tablet Commonly known as: ALEVE  Take 220 mg by mouth.   nystatin 100000 UNIT/ML suspension Commonly known as: MYCOSTATIN   OMEGA 3 500 PO Take 1 tablet by mouth at bedtime.   omeprazole 20 MG capsule Commonly known as: PRILOSEC Take 20 mg by mouth 2 (two) times daily before a meal.   ONE-A-DAY 50 PLUS PO Take 1 capsule by mouth daily.   oxyCODONE  5 MG immediate release tablet Commonly known as: Oxy IR/ROXICODONE  Take 1-2 tablets (5-10 mg total) by mouth every 4 (four) hours as  needed for severe pain or breakthrough pain.   Potassium Gluconate 595 MG Caps Take 1 capsule by mouth daily.   predniSONE 10 MG tablet Commonly known as: DELTASONE Taper 6-5-4-3-2-1-off   sucralfate  1 g tablet Commonly known as: CARAFATE  Take 1 g by mouth daily.   tadalafil  10 MG tablet Commonly known as: CIALIS  Take 1-2 tablets (10-20 mg total) by mouth daily as needed for erectile dysfunction (take 1 hour prior to sexual activity).   tamsulosin 0.4 MG Caps capsule Commonly known as: FLOMAX Take by mouth.   Toviaz 4 MG Tb24 tablet Generic drug: fesoterodine   traMADol  50 MG tablet Commonly known as: ULTRAM  Take 1-2 tablets (50-100 mg total) by mouth every 4 (four) hours as needed for moderate pain.   traMADol  50 MG tablet Commonly known as: Ultram  Take 1 tablet (50 mg total) by mouth every 12 (twelve) hours as needed.   triamcinolone  cream 0.1 % Commonly known as: KENALOG  Apply 1 Application topically as directed. Qd to bid to bites on legs until clear, avoid face, groin, axilla   valACYclovir  500 MG tablet Commonly known as: VALTREX  Take 500 mg by mouth daily as needed.   vitamin E  180 MG (400 UNITS) capsule Take 400 Units by mouth daily.   Voltaren 1 % Gel Generic drug: diclofenac Sodium apply to albow 2 times per day        Allergies:  No Known Allergies  Family History: Family History  Problem Relation Age of Onset   Stroke Mother    Heart attack Father    Stroke Father    Heart attack Brother     Social History:   reports that he has never smoked.  He has never used smokeless tobacco. He reports that he does not drink alcohol and does not use drugs.  Physical Exam: BP 138/66 (BP Location: Left Arm, Patient Position: Sitting, Cuff Size: Normal)   Pulse 62   Ht 5' 8 (1.727 m)   Wt 171 lb 9.6 oz (77.8 kg)   SpO2 95%   BMI 26.09 kg/m   Constitutional:  Alert and oriented, no acute distress, nontoxic appearing HEENT: [place], AT Cardiovascular:  No clubbing, cyanosis, or edema Respiratory: Normal respiratory effort, no increased work of breathing Skin: No rashes, bruises or suspicious lesions Neurologic: Grossly intact, no focal deficits, moving all 4 extremities Psychiatric: Normal mood and affect  Assessment & Plan:   1. Erectile dysfunction, unspecified erectile dysfunction type (Primary) Severe ED.  Hesitant to try PDE 5 inhibitors.  We discussed alternatives including topical Eroxon gel, vacuum erection devices, ICI, or IPP placement.  He would like to consider his options and follow-up as needed, patient resource information added to AVS.  Will check a.m. testosterone next week, and I was honest with him that TRT is unlikely to help him regain full erectile function. - Testosterone; Future  2. 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  Reassurance provided.  3. Nodule of anus Chronic, nonpainful.  Will refer to GI for further evaluation. - Ambulatory referral to Gastroenterology   Return in about 1 week (around 07/18/2024) for Lab visit for AM testosterone.  Lucie Hones, PA-C  [facility] [place] [address], [address] [place], [place] [address] [phone]

## 2024-07-13 DIAGNOSIS — B351 Tinea unguium: Secondary | ICD-10-CM | POA: Diagnosis not present

## 2024-07-13 DIAGNOSIS — M79675 Pain in left toe(s): Secondary | ICD-10-CM | POA: Diagnosis not present

## 2024-07-13 DIAGNOSIS — M79674 Pain in right toe(s): Secondary | ICD-10-CM | POA: Diagnosis not present

## 2024-07-17 DIAGNOSIS — Z96651 Presence of right artificial knee joint: Secondary | ICD-10-CM | POA: Diagnosis not present

## 2024-07-17 DIAGNOSIS — M1712 Unilateral primary osteoarthritis, left knee: Secondary | ICD-10-CM | POA: Diagnosis not present

## 2024-07-18 ENCOUNTER — Other Ambulatory Visit

## 2024-07-18 DIAGNOSIS — N529 Male erectile dysfunction, unspecified: Secondary | ICD-10-CM | POA: Diagnosis not present

## 2024-07-19 ENCOUNTER — Ambulatory Visit: Payer: Self-pay | Admitting: Physician Assistant

## 2024-07-19 DIAGNOSIS — I10 Essential (primary) hypertension: Secondary | ICD-10-CM | POA: Diagnosis not present

## 2024-07-19 DIAGNOSIS — E7800 Pure hypercholesterolemia, unspecified: Secondary | ICD-10-CM | POA: Diagnosis not present

## 2024-07-19 DIAGNOSIS — R600 Localized edema: Secondary | ICD-10-CM | POA: Diagnosis not present

## 2024-07-19 DIAGNOSIS — E782 Mixed hyperlipidemia: Secondary | ICD-10-CM | POA: Diagnosis not present

## 2024-07-19 DIAGNOSIS — R0602 Shortness of breath: Secondary | ICD-10-CM | POA: Diagnosis not present

## 2024-07-19 LAB — TESTOSTERONE: Testosterone: 660 ng/dL (ref 264–916)

## 2024-07-31 ENCOUNTER — Ambulatory Visit: Admitting: Dermatology

## 2024-07-31 DIAGNOSIS — L718 Other rosacea: Secondary | ICD-10-CM

## 2024-07-31 DIAGNOSIS — L820 Inflamed seborrheic keratosis: Secondary | ICD-10-CM

## 2024-07-31 DIAGNOSIS — W908XXA Exposure to other nonionizing radiation, initial encounter: Secondary | ICD-10-CM

## 2024-07-31 DIAGNOSIS — D2272 Melanocytic nevi of left lower limb, including hip: Secondary | ICD-10-CM

## 2024-07-31 DIAGNOSIS — Z79899 Other long term (current) drug therapy: Secondary | ICD-10-CM

## 2024-07-31 DIAGNOSIS — L719 Rosacea, unspecified: Secondary | ICD-10-CM | POA: Diagnosis not present

## 2024-07-31 DIAGNOSIS — L738 Other specified follicular disorders: Secondary | ICD-10-CM | POA: Diagnosis not present

## 2024-07-31 DIAGNOSIS — D239 Other benign neoplasm of skin, unspecified: Secondary | ICD-10-CM

## 2024-07-31 DIAGNOSIS — D692 Other nonthrombocytopenic purpura: Secondary | ICD-10-CM

## 2024-07-31 DIAGNOSIS — L304 Erythema intertrigo: Secondary | ICD-10-CM

## 2024-07-31 DIAGNOSIS — L578 Other skin changes due to chronic exposure to nonionizing radiation: Secondary | ICD-10-CM

## 2024-07-31 DIAGNOSIS — D2262 Melanocytic nevi of left upper limb, including shoulder: Secondary | ICD-10-CM | POA: Diagnosis not present

## 2024-07-31 DIAGNOSIS — Z1283 Encounter for screening for malignant neoplasm of skin: Secondary | ICD-10-CM | POA: Diagnosis not present

## 2024-07-31 DIAGNOSIS — L570 Actinic keratosis: Secondary | ICD-10-CM

## 2024-07-31 DIAGNOSIS — D489 Neoplasm of uncertain behavior, unspecified: Secondary | ICD-10-CM

## 2024-07-31 DIAGNOSIS — Z85828 Personal history of other malignant neoplasm of skin: Secondary | ICD-10-CM

## 2024-07-31 DIAGNOSIS — B353 Tinea pedis: Secondary | ICD-10-CM

## 2024-07-31 DIAGNOSIS — Z7189 Other specified counseling: Secondary | ICD-10-CM

## 2024-07-31 DIAGNOSIS — Z8589 Personal history of malignant neoplasm of other organs and systems: Secondary | ICD-10-CM

## 2024-07-31 HISTORY — DX: Other benign neoplasm of skin, unspecified: D23.9

## 2024-07-31 MED ORDER — DOXYCYCLINE HYCLATE 20 MG PO TABS: 20.0000 mg | ORAL_TABLET | Freq: Every day | ORAL | 11 refills | Status: AC

## 2024-07-31 MED ORDER — FLUCONAZOLE 200 MG PO TABS: 200.0000 mg | ORAL_TABLET | ORAL | 0 refills | Status: AC

## 2024-07-31 MED ORDER — METRONIDAZOLE 0.75 % EX GEL
CUTANEOUS | 6 refills | Status: AC
Start: 2024-07-31 — End: ?

## 2024-07-31 NOTE — Progress Notes (Unsigned)
 Follow-Up Visit   Subjective  Herbert Molina is a 83 y.o. male who presents for the following: Skin Cancer Screening and Full Body Skin Exam Hx of bcc, hx of scc Hx of aks and Isks Hx of rosacea using metronidazole  gel Hx of intertrigo using ketoconazole  cream and hydrocortisone  cream   The patient presents for Total-Body Skin Exam (TBSE) for skin cancer screening and mole check. The patient has spots, moles and lesions to be evaluated, some may be new or changing and the patient may have concern these could be cancer.  The following portions of the chart were reviewed this encounter and updated as appropriate: medications, allergies, medical history  Review of Systems:  No other skin or systemic complaints except as noted in HPI or Assessment and Plan.  Objective  Well appearing patient in no apparent distress; mood and affect are within normal limits.  A full examination was performed including scalp, head, eyes, ears, nose, lips, neck, chest, axillae, abdomen, back, buttocks, bilateral upper extremities, bilateral lower extremities, hands, feet, fingers, toes, fingernails, and toenails. All findings within normal limits unless otherwise noted below.   Relevant physical exam findings are noted in the Assessment and Plan.  Scalp, face x 20 (20) Erythematous thin papules/macules with gritty scale.  left lateral deltoid 0.4 cm dark brown macule   scalp and arms x 15 (15) Erythematous stuck-on, waxy papule or plaque  Assessment & Plan   HISTORY OF BASAL CELL CARCINOMA OF THE SKIN - 09/21/2007 right medial forehead - No evidence of recurrence today - Recommend regular full body skin exams - Recommend daily broad spectrum sunscreen SPF 30+ to sun-exposed areas, reapply every 2 hours as needed.  - Call if any new or changing lesions are noted between office visits   HISTORY OF SQUAMOUS CELL CARCINOMA OF THE SKIN KA Type - 06/22/2023  left forearm treated with ED&C - No evidence  of recurrence today - No lymphadenopathy - Recommend regular full body skin exams - Recommend daily broad spectrum sunscreen SPF 30+ to sun-exposed areas, reapply every 2 hours as needed.  - Call if any new or changing lesions are noted between office visits   SKIN CANCER SCREENING PERFORMED TODAY.  ACTINIC DAMAGE - Chronic condition, secondary to cumulative UV/sun exposure - diffuse scaly erythematous macules with underlying dyspigmentation - Recommend daily broad spectrum sunscreen SPF 30+ to sun-exposed areas, reapply every 2 hours as needed.  - Staying in the shade or wearing long sleeves, sun glasses (UVA+UVB protection) and wide brim hats (4-inch brim around the entire circumference of the hat) are also recommended for sun protection.  - Call for new or changing lesions.  LENTIGINES, SEBORRHEIC KERATOSES, HEMANGIOMAS - Benign normal skin lesions - Benign-appearing - Call for any changes  MELANOCYTIC NEVI - Tan-brown and/or pink-flesh-colored symmetric macules and papules - Benign appearing on exam today - Observation - Call clinic for new or changing moles - Recommend daily use of broad spectrum spf 30+ sunscreen to sun-exposed areas.   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 Condition is bothersome/symptomatic for patient. Currently flared. Intertrigo is a chronic recurrent rash that occurs in skin fold areas that may be associated with friction; heat; moisture; yeast; fungus; and bacteria.  It is exacerbated by increased movement / activity; sweating; and higher atmospheric temperature.  Use of an absorbant powder such as Zeasorb AF powder or other OTC antifungal  powder to the area daily can prevent rash recurrence. Other options to help keep the area dry include blow drying the area after bathing or using antiperspirant products such as Duradry sweat  minimizing gel. Treatment Plan: Continue ketoconazole  2 % cream - apply topically to rash nightly on Monday, Wednesday and Fridays Continue hydrocortisone  2.5 % cream apply topically to rash nighty on Tuesday Thursay and Saturdays    Recommend OTC Zeasorb AF powder to body folds daily after shower.  It is often found in the athlete's foot section in the pharmacy.  Avoid using powders that contain cornstarch.   Sebaceous Hyperplasia - Small yellow papules with a central dell - Benign-appearing - Observe. Call for changes.   EPIDERMAL INCLUSION CYST Exam: Subcutaneous nodule at R axilla 1.0cm Benign-appearing. Exam most consistent with an epidermal inclusion cyst. Discussed that a cyst is a benign growth that can grow over time and sometimes get irritated or inflamed. Recommend observation if it is not bothersome. Discussed option of surgical excision to remove it if it is growing, symptomatic, or other changes noted. Please call for new or changing lesions so they can be evaluated.     Purpura - Chronic; persistent and recurrent.  Treatable, but not curable. 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 Currently well-controlled. Rosacea is a chronic progressive skin condition usually affecting the face of adults, causing redness and/or acne bumps. It is treatable but not curable. It sometimes affects the eyes (ocular rosacea) as well. It may respond to topical and/or systemic medication and can flare with stress, sun exposure, alcohol, exercise and some foods.  Daily application of broad spectrum spf 30+ sunscreen to face is  recommended to reduce flares. Treatment Plan:  Start doxycycline 20 mg capsule take 1 po qd with food and drink Cont Metronidazole  0.75% gel qhs  Doxycycline should be taken with food to prevent nausea. Do not lay down for 30 minutes after taking. Be cautious with sun exposure and use good sun protection while on this medication. Pregnant women should not take this medication.    TINEA PEDIS Exam: Scaling and maceration web spaces and over distal and lateral soles. Chronic and persistent condition with duration or expected duration over one year. Condition is symptomatic / bothersome to patient. Not to goal. Treatment Plan: Reviewed Liver Function test labs from 06/29/2024 [facility] Fluconazole  200 mg 1 po 3 times per week for a month 12 pills no refills   ACTINIC KERATOSIS (20) Scalp, face x 20 (20) Actinic keratoses are precancerous spots that appear secondary to cumulative UV radiation exposure/sun exposure over time. They are chronic with expected duration over 1 year. A portion of actinic keratoses will progress to squamous cell carcinoma of the skin. It is not possible to reliably predict which spots will progress to skin cancer and so treatment is recommended to prevent development of skin cancer.  Recommend daily broad spectrum sunscreen SPF 30+ to sun-exposed areas, reapply every 2 hours as needed.  Recommend staying in the shade or wearing long sleeves, sun glasses (UVA+UVB protection) and wide brim hats (4-inch brim around the entire circumference of the  hat). Call for new or changing lesions. Destruction of lesion - Scalp, face x 20 (20) Complexity: simple   Destruction method: cryotherapy   Informed consent: discussed and consent obtained   Timeout:  patient name, date of birth, surgical site, and procedure verified Lesion destroyed using liquid nitrogen: Yes   Region frozen until ice ball extended beyond lesion: Yes   Outcome: patient tolerated procedure well with no  complications   Post-procedure details: wound care instructions given    ROSACEA   Related Medications doxycycline (PERIOSTAT) 20 MG tablet Take 1 tablet (20 mg total) by mouth daily. With food and drink for rosacea metroNIDAZOLE  (METROGEL ) 0.75 % gel Apply to face at bedtime rosacea ERYTHEMA INTERTRIGO   Related Medications ketoconazole  (NIZORAL ) 2 % cream Apply topically to affected rash at lower abdomen nightly on Monday Wednesday and Fridays hydrocortisone  2.5 % cream Apply topically to rash at lower abdomen nightly on Tuesday, Thursday and Saturdays only NEOPLASM OF UNCERTAIN BEHAVIOR left lateral deltoid Epidermal / dermal shaving  Lesion diameter (cm):  0.4 Informed consent: discussed and consent obtained   Timeout: patient name, date of birth, surgical site, and procedure verified   Procedure prep:  Patient was prepped and draped in usual sterile fashion Prep type:  Isopropyl alcohol Anesthesia: the lesion was anesthetized in a standard fashion   Anesthetic:  1% lidocaine  w/ epinephrine  1-100,000 buffered w/ 8.4% NaHCO3 Instrument used: flexible razor blade   Hemostasis achieved with: pressure, aluminum chloride and electrodesiccation   Outcome: patient tolerated procedure well   Post-procedure details: sterile dressing applied and wound care instructions given   Dressing type: bandage and petrolatum    Specimen 1 - Surgical pathology Differential Diagnosis: nevus r/o dysplasia   Check Margins: yes Nevus r/o dysplasia  INFLAMED SEBORRHEIC KERATOSIS (15) scalp and arms x 15 (15) Symptomatic, irritating, patient would like treated. Destruction of lesion - scalp and arms x 15 (15) Complexity: simple   Destruction method: cryotherapy   Informed consent: discussed and consent obtained   Timeout:  patient name, date of birth, surgical site, and procedure verified Lesion destroyed using liquid nitrogen: Yes   Region frozen until ice ball extended beyond lesion: Yes    Outcome: patient tolerated procedure well with no complications   Post-procedure details: wound care instructions given    TINEA PEDIS OF BOTH FEET   Related Medications fluconazole  (DIFLUCAN ) 200 MG tablet Take 1 tablet (200 mg total) by mouth 3 (three) times a week. For tinea pedis Return in about 6 months (around 01/29/2025) for tbse .  IEleanor Blush, CMA, am acting as scribe for Alm Rhyme, MD.   Documentation: I have reviewed the above documentation for accuracy and completeness, and I agree with the above.  Alm Rhyme, MD

## 2024-07-31 NOTE — Patient Instructions (Signed)
 Biopsy Wound Care Instructions  Leave the original bandage on for 24 hours if possible.  If the bandage becomes soaked or soiled before that time, it is OK to remove it and examine the wound.  A small amount of post-operative bleeding is normal.  If excessive bleeding occurs, remove the bandage, place gauze over the site and apply continuous pressure (no peeking) over the area for 30 minutes. If this does not work, please call our clinic as soon as possible or page your doctor if it is after hours.   Once a day, cleanse the wound with soap and water. It is fine to shower. If a thick crust develops you may use a Q-tip dipped into dilute hydrogen peroxide (mix 1:1 with water) to dissolve it.  Hydrogen peroxide can slow the healing process, so use it only as needed.    After washing, apply petroleum jelly (Vaseline) or an antibiotic ointment if your doctor prescribed one for you, followed by a bandage.    For best healing, the wound should be covered with a layer of ointment at all times. If you are not able to keep the area covered with a bandage to hold the ointment in place, this may mean re-applying the ointment several times a day.  Continue this wound care until the wound has healed and is no longer open.   Itching and mild discomfort is normal during the healing process. However, if you develop pain or severe itching, please call our office.   If you have any discomfort, you can take Tylenol  (acetaminophen ) or ibuprofen  as directed on the bottle. (Please do not take these if you have an allergy to them or cannot take them for another reason).  Some redness, tenderness and white or yellow material in the wound is normal healing.  If the area becomes very sore and red, or develops a thick yellow-green material (pus), it may be infected; please notify us .    If you have stitches, return to clinic as directed to have the stitches removed. You will continue wound care for 2-3 days after the stitches  are removed.   Wound healing continues for up to one year following surgery. It is not unusual to experience pain in the scar from time to time during the interval.  If the pain becomes severe or the scar thickens, you should notify the office.    A slight amount of redness in a scar is expected for the first six months.  After six months, the redness will fade and the scar will soften and fade.  The color difference becomes less noticeable with time.  If there are any problems, return for a post-op surgery check at your earliest convenience.  To improve the appearance of the scar, you can use silicone scar gel, cream, or sheets (such as Mederma or Serica) every night for up to one year. These are available over the counter (without a prescription).  Please call our office at [phone] for any questions or concerns.    Actinic keratoses are precancerous spots that appear secondary to cumulative UV radiation exposure/sun exposure over time. They are chronic with expected duration over 1 year. A portion of actinic keratoses will progress to squamous cell carcinoma of the skin. It is not possible to reliably predict which spots will progress to skin cancer and so treatment is recommended to prevent development of skin cancer.  Recommend daily broad spectrum sunscreen SPF 30+ to sun-exposed areas, reapply every 2 hours as needed.  Recommend staying in the shade or wearing long sleeves, sun glasses (UVA+UVB protection) and wide brim hats (4-inch brim around the entire circumference of the hat). Call for new or changing lesions.    Cryotherapy Aftercare  Wash gently with soap and water everyday.   Apply Vaseline and Band-Aid daily until healed.     Seborrheic Keratosis  What causes seborrheic keratoses? Seborrheic keratoses are harmless, common skin growths that first appear during adult life.  As time goes by, more growths appear.  Some people may develop a large number of them.   Seborrheic keratoses appear on both covered and uncovered body parts.  They are not caused by sunlight.  The tendency to develop seborrheic keratoses can be inherited.  They vary in color from skin-colored to gray, brown, or even black.  They can be either smooth or have a rough, warty surface.   Seborrheic keratoses are superficial and look as if they were stuck on the skin.  Under the microscope this type of keratosis looks like layers upon layers of skin.  That is why at times the top layer may seem to fall off, but the rest of the growth remains and re-grows.    Treatment Seborrheic keratoses do not need to be treated, but can easily be removed in the office.  Seborrheic keratoses often cause symptoms when they rub on clothing or jewelry.  Lesions can be in the way of shaving.  If they become inflamed, they can cause itching, soreness, or burning.  Removal of a seborrheic keratosis can be accomplished by freezing, burning, or surgery. If any spot bleeds, scabs, or grows rapidly, please return to have it checked, as these can be an indication of a skin cancer.    Melanoma ABCDEs  Melanoma is the most dangerous type of skin cancer, and is the leading cause of death from skin disease.  You are more likely to develop melanoma if you: Have light-colored skin, light-colored eyes, or red or blond hair Spend a lot of time in the sun Tan regularly, either outdoors or in a tanning bed Have had blistering sunburns, especially during childhood Have a close family member who has had a melanoma Have atypical moles or large birthmarks  Early detection of melanoma is key since treatment is typically straightforward and cure rates are extremely high if we catch it early.   The first sign of melanoma is often a change in a mole or a new dark spot.  The ABCDE system is a way of remembering the signs of melanoma.  A for asymmetry:  The two halves do not match. B for border:  The edges of the growth are  irregular. C for color:  A mixture of colors are present instead of an even brown color. D for diameter:  Melanomas are usually (but not always) greater than 6mm - the size of a pencil eraser. E for evolution:  The spot keeps changing in size, shape, and color.  Please check your skin once per month between visits. You can use a small mirror in front and a large mirror behind you to keep an eye on the back side or your body.   If you see any new or changing lesions before your next follow-up, please call to schedule a visit.  Please continue daily skin protection including broad spectrum sunscreen SPF 30+ to sun-exposed areas, reapplying every 2 hours as needed when you're outdoors.   Staying in the shade or wearing long sleeves, sun glasses (UVA+UVB  protection) and wide brim hats (4-inch brim around the entire circumference of the hat) are also recommended for sun protection.    Due to recent changes in healthcare laws, you may see results of your pathology and/or laboratory studies on MyChart before the doctors have had a chance to review them. We understand that in some cases there may be results that are confusing or concerning to you. Please understand that not all results are received at the same time and often the doctors may need to interpret multiple results in order to provide you with the best plan of care or course of treatment. Therefore, we ask that you please give us  2 business days to thoroughly review all your results before contacting the office for clarification. Should we see a critical lab result, you will be contacted sooner.   If You Need Anything After Your Visit  If you have any questions or concerns for your doctor, please call our main line at [phone] and press option 4 to reach your doctor's medical assistant. If no one answers, please leave a voicemail as directed and we will return your call as soon as possible. Messages left after 4 pm will be answered the  following business day.   You may also send us  a message via MyChart. We typically respond to MyChart messages within 1-2 business days.  For prescription refills, please ask your pharmacy to contact our office. Our fax number is [phone].  If you have an urgent issue when the clinic is closed that cannot wait until the next business day, you can page your doctor at the number below.    Please note that while we do our best to be available for urgent issues outside of office hours, we are not available 24/7.   If you have an urgent issue and are unable to reach us , you may choose to seek medical care at your doctor's office, retail clinic, urgent care center, or emergency room.  If you have a medical emergency, please immediately call 911 or go to the emergency department.  Pager Numbers  - Dr. Hester: [phone]  - Dr. Jackquline: [phone]  - Dr. Claudene: [phone]   - Dr. Raymund: [phone]  In the event of inclement weather, please call our main line at [phone] for an update on the status of any delays or closures.  Dermatology Medication Tips: Please keep the boxes that topical medications come in in order to help keep track of the instructions about where and how to use these. Pharmacies typically print the medication instructions only on the boxes and not directly on the medication tubes.   If your medication is too expensive, please contact our office at [phone] option 4 or send us  a message through MyChart.   We are unable to tell what your co-pay for medications will be in advance as this is different depending on your insurance coverage. However, we may be able to find a substitute medication at lower cost or fill out paperwork to get insurance to cover a needed medication.   If a prior authorization is required to get your medication covered by your insurance company, please allow us  1-2 business days to complete this process.  Drug prices often vary  depending on where the prescription is filled and some pharmacies may offer cheaper prices.  The website www.goodrx.com contains coupons for medications through different pharmacies. The prices here do not account for what the cost may be with help from insurance (it may be cheaper  with your insurance), but the website can give you the price if you did not use any insurance.  - You can print the associated coupon and take it with your prescription to the pharmacy.  - You may also stop by our office during regular business hours and pick up a GoodRx coupon card.  - If you need your prescription sent electronically to a different pharmacy, notify our office through [facility] or by phone at [phone] option 4.     Si Usted Necesita Algo Despus de Su Visita  Tambin puede enviarnos un mensaje a travs de Clinical cytogeneticist. Por lo general respondemos a los mensajes de MyChart en el transcurso de 1 a 2 das hbiles.  Para renovar recetas, por favor pida a su farmacia que se ponga en contacto con nuestra oficina. Randi lakes de fax es [place] [phone].  Si tiene un asunto urgente cuando la clnica est cerrada y que no puede esperar hasta el siguiente da hbil, puede llamar/localizar a su doctor(a) al nmero que aparece a continuacin.   Por favor, tenga en cuenta que aunque hacemos todo lo posible para estar disponibles para asuntos urgentes fuera del horario de [place], no estamos disponibles las 24 horas del da, los 7 [address]  [address] de la [place].   Si tiene un problema urgente y no puede comunicarse con nosotros, puede optar por buscar atencin mdica  en el consultorio de su doctor(a), en una clnica privada, en un centro de atencin urgente o en una sala de emergencias.  Si tiene Engineer, drilling, por favor llame inmediatamente al 911 o vaya a la sala de emergencias.  Nmeros de bper  - Dr. Hester: [phone]  - Dra. Jackquline: [phone]  - Dr. Claudene: [phone]  - Dra.  Kitts: [phone]  En caso de inclemencias del [place], por favor llame a nuestra lnea principal al [phone] para una actualizacin sobre el estado de cualquier retraso o cierre.  Consejos para la medicacin en dermatologa: Por favor, guarde las cajas en las que vienen los medicamentos de uso tpico para ayudarle a seguir las instrucciones sobre dnde y cmo usarlos. Las farmacias generalmente imprimen las instrucciones del medicamento slo en las cajas y no directamente en los tubos del [place].   Si su medicamento es muy caro, por favor, pngase en contacto con landry rieger llamando al [phone] y presione la opcin 4 o envenos un mensaje a travs de Clinical cytogeneticist.   No podemos decirle cul ser su copago por los medicamentos por adelantado ya que esto es diferente dependiendo de la cobertura de su seguro. Sin embargo, es posible que podamos encontrar un medicamento sustituto a Audiological scientist un formulario para que el seguro cubra el medicamento que se considera necesario.   Si se requiere una autorizacin previa para que su compaa de seguros malta su medicamento, por favor permtanos de 1 a 2 das hbiles para completar este proceso.  Los precios de los medicamentos varan con frecuencia dependiendo del Environmental consultant de dnde se surte la receta y alguna farmacias pueden ofrecer precios ms baratos.  El sitio web www.goodrx.com tiene cupones para medicamentos de Health and safety inspector. Los precios aqu no tienen en cuenta lo que podra costar con la ayuda del seguro (puede ser ms barato con su seguro), pero el sitio web puede darle el precio si no utiliz Tourist information centre manager.  - Puede imprimir el cupn correspondiente y llevarlo con su receta a la farmacia.  - Tambin puede pasar por nuestra oficina durante el horario de atencin  regular y recoger una tarjeta de cupones de GoodRx.  - Si necesita que su receta se enve electrnicamente a una farmacia diferente, informe a nuestra oficina a  travs de MyChart de [place] o por telfono llamando al [phone] y presione la opcin 4.

## 2024-08-01 ENCOUNTER — Other Ambulatory Visit: Payer: Self-pay

## 2024-08-01 ENCOUNTER — Encounter: Payer: Self-pay | Admitting: Dermatology

## 2024-08-01 DIAGNOSIS — K6289 Other specified diseases of anus and rectum: Secondary | ICD-10-CM

## 2024-08-01 NOTE — Telephone Encounter (Signed)
 Pt states he needed his results from his labs.   LM LVM with results on 10/20. Verified we have the correct number on file.   Gave him a copy of labs.   He states he was referred to [facility] for a a nodule on his anus but they do not have a Dr at this time. Requested we send referral to [facility].   Closed [facility] referral and sent referral to [facility].

## 2024-08-06 ENCOUNTER — Ambulatory Visit: Payer: Self-pay | Admitting: Dermatology

## 2024-08-06 ENCOUNTER — Encounter: Payer: Self-pay | Admitting: Dermatology

## 2024-08-06 LAB — SURGICAL PATHOLOGY

## 2024-08-06 NOTE — Telephone Encounter (Addendum)
 Tried calling patient regarding results. No answer. LM for patient to return call.    ----- Message from Alm Rhyme sent at 08/06/2024  5:28 PM EST ----- FINAL DIAGNOSIS        1. Skin, left lateral lateral deltoid :       DYSPLASTIC JUNCTIONAL NEVUS WITH SEVERE ATYPIA, INFLAMED, CLOSE TO MARGIN, SEE       DESCRIPTION   Severe Dysplastic Margins clear, but CLOSE TO MARGIN May need additional treatment Recheck next visit ----- Message ----- From: Interface, Lab In Three Zero One Sent: 08/06/2024   5:15 PM EST To: Alm JAYSON Rhyme, MD

## 2024-08-07 NOTE — Telephone Encounter (Signed)
 Spoke with patient and advised of results.

## 2024-08-07 NOTE — Telephone Encounter (Signed)
-----   Message from Alm Rhyme sent at 08/06/2024  5:28 PM EST ----- FINAL DIAGNOSIS        1. Skin, left lateral lateral deltoid :       DYSPLASTIC JUNCTIONAL NEVUS WITH SEVERE ATYPIA, INFLAMED, CLOSE TO MARGIN, SEE       DESCRIPTION   Severe Dysplastic Margins clear, but CLOSE TO MARGIN May need additional treatment Recheck next visit ----- Message ----- From: Interface, Lab In Three Zero One Sent: 08/06/2024   5:15 PM EST To: Alm JAYSON Rhyme, MD

## 2024-08-31 DIAGNOSIS — M25511 Pain in right shoulder: Secondary | ICD-10-CM | POA: Diagnosis not present

## 2024-08-31 DIAGNOSIS — M7021 Olecranon bursitis, right elbow: Secondary | ICD-10-CM | POA: Diagnosis not present

## 2024-08-31 DIAGNOSIS — R229 Localized swelling, mass and lump, unspecified: Secondary | ICD-10-CM | POA: Diagnosis not present

## 2024-08-31 DIAGNOSIS — M25521 Pain in right elbow: Secondary | ICD-10-CM | POA: Diagnosis not present

## 2024-10-11 ENCOUNTER — Other Ambulatory Visit
Admission: RE | Admit: 2024-10-11 | Discharge: 2024-10-11 | Disposition: A | Discharge status: Home / Self Care | Source: Ambulatory Visit | Attending: Rheumatology | Admitting: Rheumatology

## 2024-10-11 DIAGNOSIS — M0609 Rheumatoid arthritis without rheumatoid factor, multiple sites: Secondary | ICD-10-CM | POA: Insufficient documentation

## 2024-10-11 DIAGNOSIS — M25421 Effusion, right elbow: Secondary | ICD-10-CM | POA: Diagnosis present

## 2024-10-11 LAB — SYNOVIAL CELL COUNT + DIFF, W/ CRYSTALS
Crystals, Fluid: NONE SEEN
Eosinophils-Synovial: 0 %
Lymphocytes-Synovial Fld: 35 %
Monocyte-Macrophage-Synovial Fluid: 7 %
Neutrophil, Synovial: 58 %
WBC, Synovial: 98 /mm3 (ref 0–200)

## 2024-10-15 LAB — BODY FLUID CULTURE W GRAM STAIN
Culture: NO GROWTH
Gram Stain: NONE SEEN

## 2024-12-05 ENCOUNTER — Ambulatory Visit: Admit: 2024-12-05 | Admitting: Ophthalmology

## 2024-12-05 SURGERY — PHACOEMULSIFICATION, CATARACT, WITH IOL INSERTION
Anesthesia: Topical | Laterality: Right

## 2024-12-19 ENCOUNTER — Ambulatory Visit: Admit: 2024-12-19 | Admitting: Ophthalmology

## 2024-12-19 SURGERY — PHACOEMULSIFICATION, CATARACT, WITH IOL INSERTION
Anesthesia: Topical | Laterality: Left

## 2025-01-30 ENCOUNTER — Ambulatory Visit: Admitting: Dermatology
